# Patient Record
Sex: Male | Born: 1935 | Race: Black or African American | Hispanic: No | Smoking: Current every day smoker
Health system: Southern US, Community
[De-identification: ages and names within clinical notes are randomized; demographics above are authoritative.]

## PROBLEM LIST (undated history)

## (undated) DIAGNOSIS — F0280 Dementia in other diseases classified elsewhere without behavioral disturbance: Secondary | ICD-10-CM

## (undated) DIAGNOSIS — I10 Essential (primary) hypertension: Secondary | ICD-10-CM

## (undated) DIAGNOSIS — I509 Heart failure, unspecified: Secondary | ICD-10-CM

## (undated) DIAGNOSIS — N400 Enlarged prostate without lower urinary tract symptoms: Secondary | ICD-10-CM

## (undated) DIAGNOSIS — N189 Chronic kidney disease, unspecified: Secondary | ICD-10-CM

## (undated) DIAGNOSIS — C61 Malignant neoplasm of prostate: Secondary | ICD-10-CM

## (undated) DIAGNOSIS — D649 Anemia, unspecified: Secondary | ICD-10-CM

## (undated) DIAGNOSIS — E785 Hyperlipidemia, unspecified: Secondary | ICD-10-CM

## (undated) HISTORY — PX: PROSTATECTOMY: SHX69

## (undated) HISTORY — DX: Dementia in other diseases classified elsewhere, unspecified severity, without behavioral disturbance, psychotic disturbance, mood disturbance, and anxiety: F02.80

## (undated) HISTORY — DX: Anemia, unspecified: D64.9

## (undated) HISTORY — DX: Essential (primary) hypertension: I10

## (undated) HISTORY — DX: Malignant neoplasm of prostate: C61

## (undated) HISTORY — DX: Hyperlipidemia, unspecified: E78.5

## (undated) HISTORY — DX: Benign prostatic hyperplasia without lower urinary tract symptoms: N40.0

## (undated) HISTORY — DX: Heart failure, unspecified: I50.9

## (undated) HISTORY — DX: Chronic kidney disease, unspecified: N18.9

---

## 2000-04-01 HISTORY — PX: PROSTATE BIOPSY: SHX241

## 2000-04-04 ENCOUNTER — Other Ambulatory Visit: Admission: RE | Admit: 2000-04-04 | Discharge: 2000-04-04 | Payer: Self-pay | Admitting: Urology

## 2000-04-04 ENCOUNTER — Encounter (INDEPENDENT_AMBULATORY_CARE_PROVIDER_SITE_OTHER): Payer: Self-pay | Admitting: Specialist

## 2003-06-18 ENCOUNTER — Ambulatory Visit (HOSPITAL_COMMUNITY): Admission: RE | Admit: 2003-06-18 | Discharge: 2003-06-18 | Payer: Self-pay | Admitting: Urology

## 2003-06-21 ENCOUNTER — Ambulatory Visit (HOSPITAL_BASED_OUTPATIENT_CLINIC_OR_DEPARTMENT_OTHER): Admission: RE | Admit: 2003-06-21 | Discharge: 2003-06-21 | Payer: Self-pay | Admitting: Urology

## 2003-06-21 ENCOUNTER — Ambulatory Visit (HOSPITAL_COMMUNITY): Admission: RE | Admit: 2003-06-21 | Discharge: 2003-06-21 | Payer: Self-pay | Admitting: Urology

## 2003-06-21 HISTORY — PX: CYSTOSCOPY: SHX5120

## 2003-07-19 ENCOUNTER — Ambulatory Visit (HOSPITAL_COMMUNITY): Admission: RE | Admit: 2003-07-19 | Discharge: 2003-07-19 | Payer: Self-pay | Admitting: Urology

## 2003-08-08 ENCOUNTER — Inpatient Hospital Stay (HOSPITAL_COMMUNITY): Admission: RE | Admit: 2003-08-08 | Discharge: 2003-08-14 | Payer: Self-pay | Admitting: Urology

## 2003-08-08 ENCOUNTER — Encounter (INDEPENDENT_AMBULATORY_CARE_PROVIDER_SITE_OTHER): Payer: Self-pay | Admitting: *Deleted

## 2004-05-01 ENCOUNTER — Encounter (INDEPENDENT_AMBULATORY_CARE_PROVIDER_SITE_OTHER): Payer: Self-pay | Admitting: Specialist

## 2004-05-01 ENCOUNTER — Ambulatory Visit (HOSPITAL_COMMUNITY): Admission: RE | Admit: 2004-05-01 | Discharge: 2004-05-01 | Payer: Self-pay | Admitting: Urology

## 2004-05-01 ENCOUNTER — Ambulatory Visit (HOSPITAL_BASED_OUTPATIENT_CLINIC_OR_DEPARTMENT_OTHER): Admission: RE | Admit: 2004-05-01 | Discharge: 2004-05-01 | Payer: Self-pay | Admitting: Urology

## 2005-12-16 IMAGING — XA DG NEPHROSTOGRAM LEFT
1 series · 13 of 18 positions shown · IV contrast (omnipaque)
Comparison: none

CLINICAL DATA: 77 year-old male with bilateral hydronephrosis related to bladder outlet obstruction from prosthetic hypertrophy.  Request is made for internalization of the access with ureteral stents.
BILATERAL NEPHROSTOMY CATHETER EXCHANGE AND ANTEGRADE NEPHROSTOGRAMS
Radiologist:  Dr. Thang.
Guidance:  Fluoroscopy
Medications: 2 mg Versed 100 mcg Fentanyl.
Contrast: 60 cc Omnipaque 300 
Complications:  No immediate complications.
Procedure/Findings:
Informed consent was obtained from the patient.  
The patient was positioned prone. The left nephrostomy catheter was injected to confirm position.  The catheter was cut and removed over Strimaityte Uoga guidewire.  Access was manipulated through the dilated collecting system into the renal pelvis.  Additional injection was performed for antegrade left nephrostogram.  The catheter access was manipulated to the right ureterovesical junction.  Guidewire access was manipulated into the bladder.  The catheter was advanced into the bladder.  Contrast was injected to confirm bladder position. The Foley catheter was clamped to opacify the bladder.  The course of the Kumpe catheter through the ureterovesical junction is somewhat acute and the catheter appears to course over an intraluminal bladder filling defect or mass.  Because of this unusual configuration, attempt at inserting internal ureteral stents was not performed.  Therefore the access was exchanged over a guidewire for a new 10 Hober Gil pigtail catheter positioned in the renal pelvis. Contrast was injected to confirm position of the catheter.  
At this point, attention was directed to the right nephrostomy catheter, contrast was injected to confirm position.  The catheter tip is within an infundibulum and appears to be slightly retracted.  Therefore the catheter was cut and removed.  Over Strimaityte Uoga guidewire, a new 10 French catheter was advanced into the renal pelvis and confirmed with contrast injection fluoroscopically.  Additional contrast was injected for a right nephrostogram.  Both catheters were secured to the skin with 0 silk suture and connected to gravity drainage bags.  Dry sterile dressing was applied.

[Series 1000: run · 0.13mm/px · 13 of 18 slices shown]
[im 1/18]
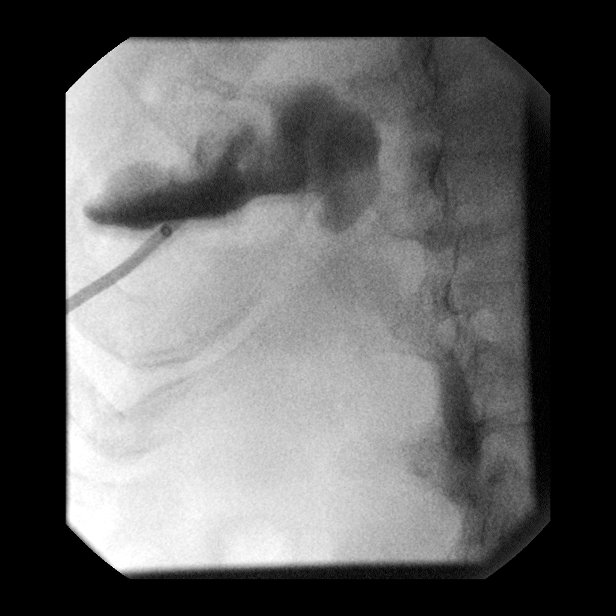
[im 3/18]
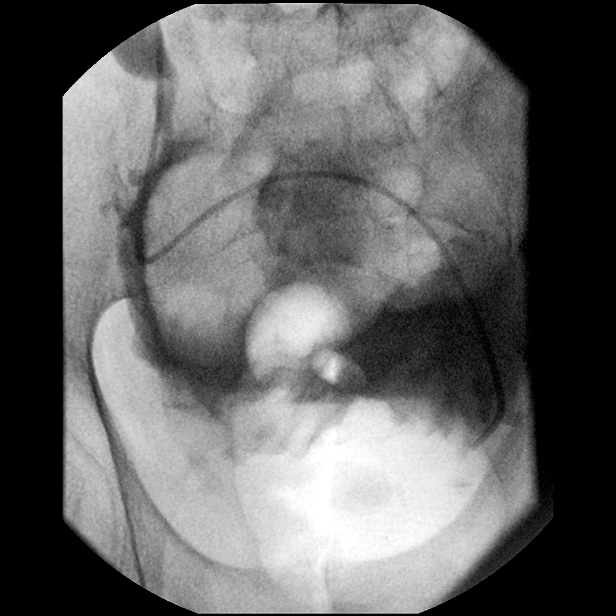
[im 4/18]
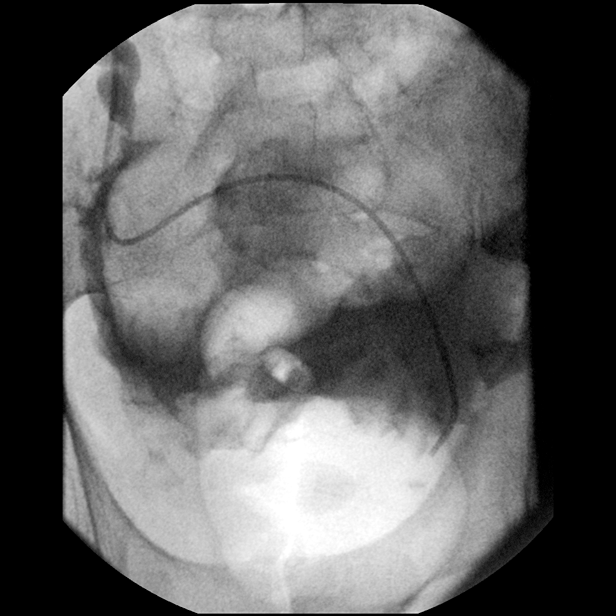
[im 5/18]
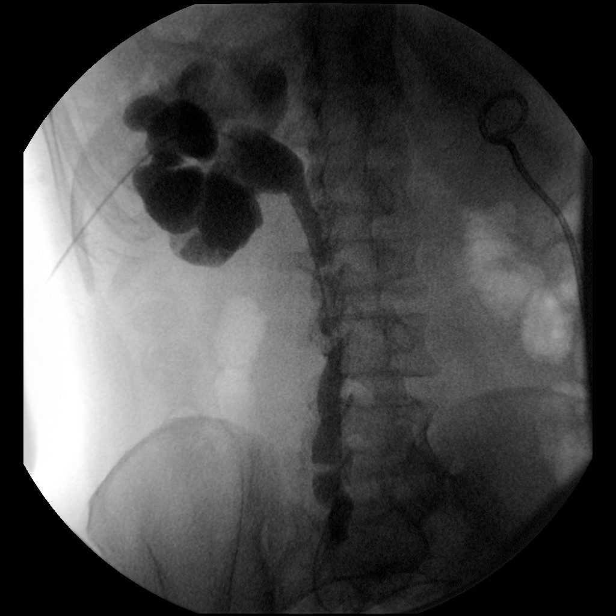
[im 7/18]
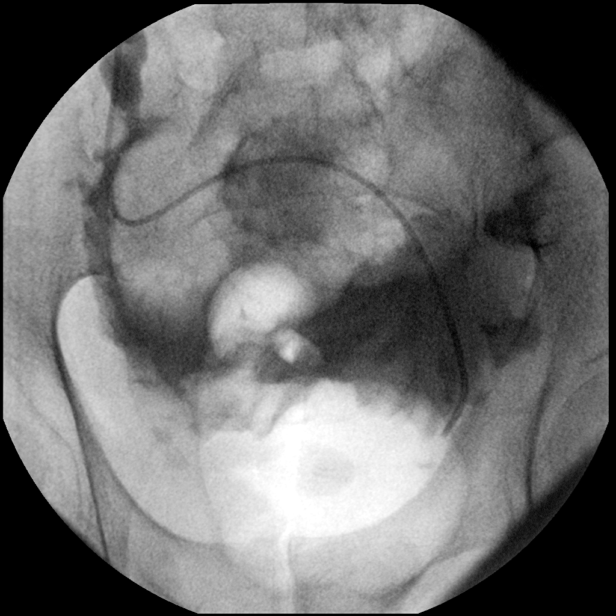
[im 8/18]
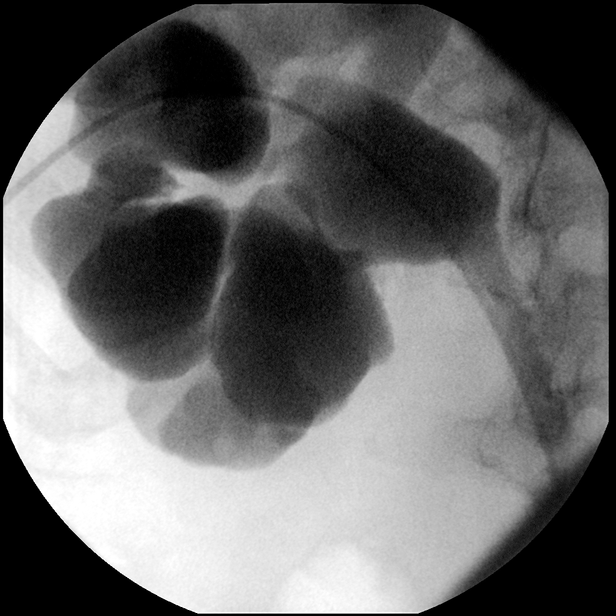
[im 10/18]
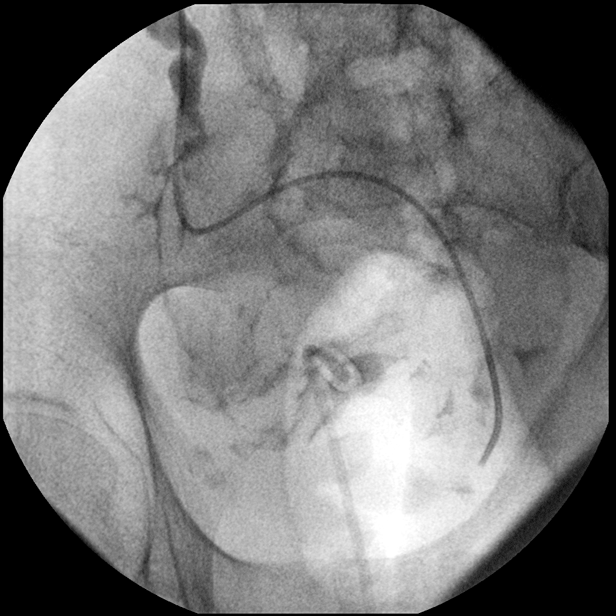
[im 11/18]
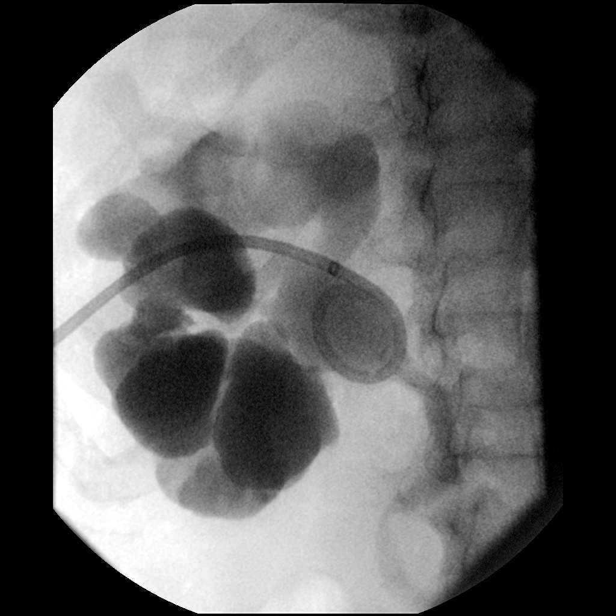
[im 12/18]
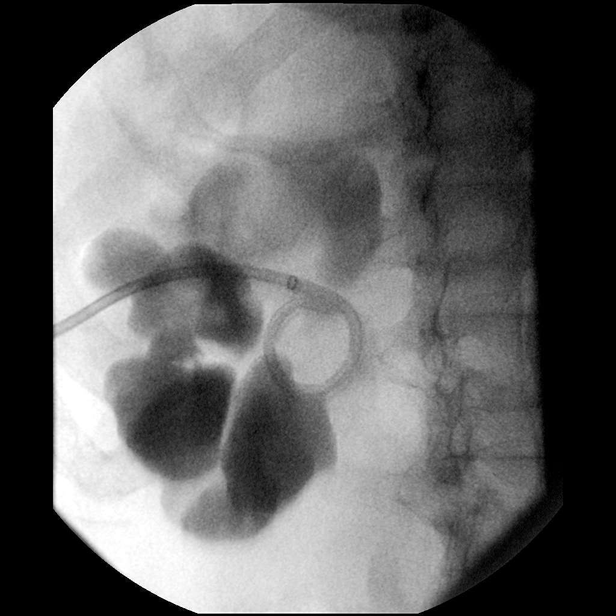
[im 14/18]
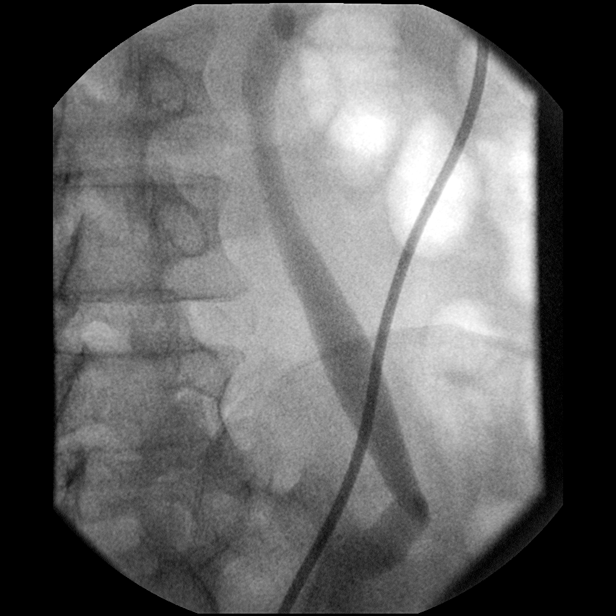
[im 15/18]
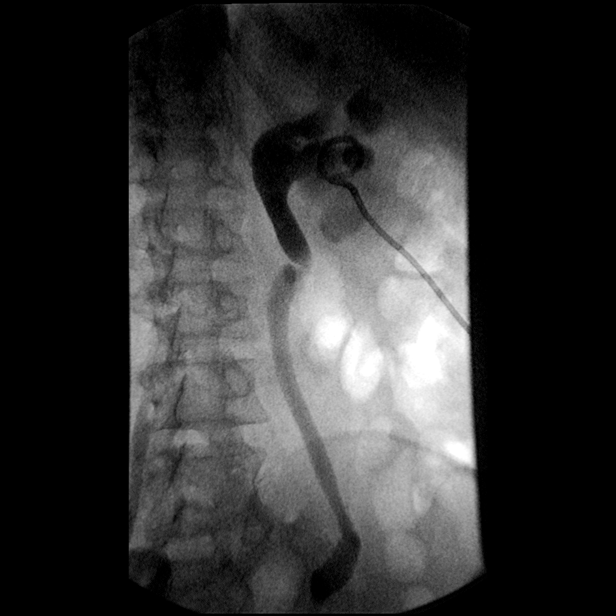
[im 16/18]
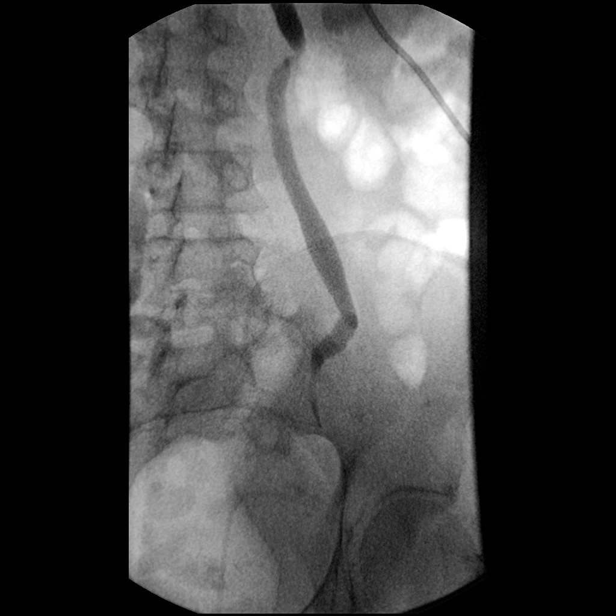
[im 18/18]
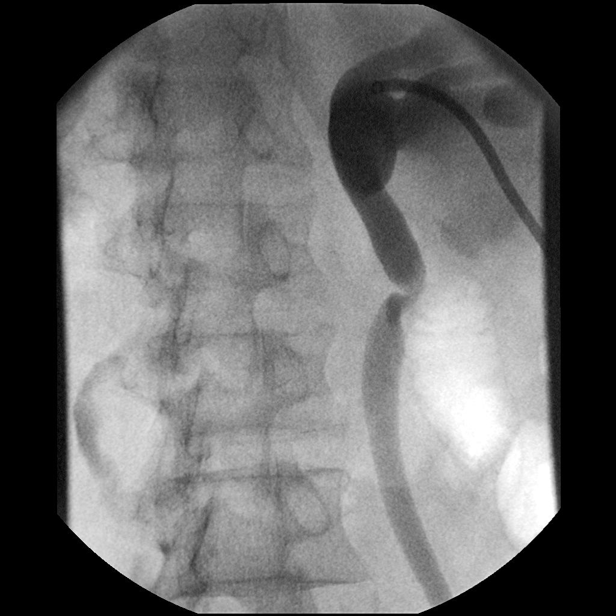

[13 of 18 positions shown; findings below may reference images not displayed]

FINDINGS: Left nephrostogram:  There is a massive right hydronephrosis with a dilated ureter as well.  The ureter is very tortuous.  Filling defects along the ureter are noted consistent with injected air.  The left UVJ is visualized.  The course of the catheter through the left UVJ is somewhat acute in angulation and appears to be displaced by an intraluminal bladder filling defect concerning for a bladder mass.  Foley catheter is evident in the midline.  The bladder is trabeculated related to outlet obstruction.  The enlarged prostate appears to impress upon the bladder base.
Right nephrostogram:  The right collecting system also is moderately dilated but to a lesser degree.  The right ureter is also mildly tortuous and dilated.  The distal ureterovesical junction could not be opacified and appears to be obstructed possibly related to compression related to the bladder mass.
IMPRESSION: 1.  Residual bilateral hydroureteronephrosis.
2.  Exchange of bilateral 10 French nephrostomy catheters.
3.  Suspect intraluminal large bladder mass displacing the catheter access through the left ureterovesical junction.  Because of this finding, insertion of double J ureteral stents was not performed.  Further evaluation and workup should be performed to investigate the possibility of a pelvic or bladder mass.

## 2006-03-28 ENCOUNTER — Ambulatory Visit: Admission: RE | Admit: 2006-03-28 | Discharge: 2006-06-17 | Payer: Self-pay | Admitting: Radiation Oncology

## 2006-06-17 ENCOUNTER — Ambulatory Visit: Admission: RE | Admit: 2006-06-17 | Discharge: 2006-07-19 | Payer: Self-pay | Admitting: Radiation Oncology

## 2006-11-26 ENCOUNTER — Inpatient Hospital Stay (HOSPITAL_COMMUNITY): Admission: EM | Admit: 2006-11-26 | Discharge: 2006-12-05 | Payer: Self-pay | Admitting: Emergency Medicine

## 2006-12-19 ENCOUNTER — Encounter (HOSPITAL_COMMUNITY): Admission: RE | Admit: 2006-12-19 | Discharge: 2007-03-19 | Payer: Self-pay | Admitting: Nephrology

## 2007-03-27 ENCOUNTER — Encounter (HOSPITAL_COMMUNITY): Admission: RE | Admit: 2007-03-27 | Discharge: 2007-06-25 | Payer: Self-pay | Admitting: Nephrology

## 2007-09-08 ENCOUNTER — Encounter (HOSPITAL_COMMUNITY): Admission: RE | Admit: 2007-09-08 | Discharge: 2007-12-07 | Payer: Self-pay | Admitting: Nephrology

## 2007-12-25 ENCOUNTER — Encounter (HOSPITAL_COMMUNITY): Admission: RE | Admit: 2007-12-25 | Discharge: 2008-01-11 | Payer: Self-pay | Admitting: Nephrology

## 2008-01-23 ENCOUNTER — Encounter (HOSPITAL_COMMUNITY): Admission: RE | Admit: 2008-01-23 | Discharge: 2008-04-22 | Payer: Self-pay | Admitting: Nephrology

## 2008-03-07 ENCOUNTER — Encounter: Admission: RE | Admit: 2008-03-07 | Discharge: 2008-03-07 | Payer: Self-pay | Admitting: Nephrology

## 2008-03-18 ENCOUNTER — Ambulatory Visit: Payer: Self-pay | Admitting: Surgery

## 2008-04-30 ENCOUNTER — Encounter (HOSPITAL_COMMUNITY): Admission: RE | Admit: 2008-04-30 | Discharge: 2008-07-29 | Payer: Self-pay | Admitting: Nephrology

## 2008-08-09 ENCOUNTER — Encounter (HOSPITAL_COMMUNITY): Admission: RE | Admit: 2008-08-09 | Discharge: 2008-11-07 | Payer: Self-pay | Admitting: Nephrology

## 2008-11-15 ENCOUNTER — Encounter (HOSPITAL_COMMUNITY): Admission: RE | Admit: 2008-11-15 | Discharge: 2009-02-13 | Payer: Self-pay | Admitting: Nephrology

## 2009-01-14 LAB — HM COLONOSCOPY

## 2009-02-21 ENCOUNTER — Encounter (HOSPITAL_COMMUNITY): Admission: RE | Admit: 2009-02-21 | Discharge: 2009-05-22 | Payer: Self-pay | Admitting: Nephrology

## 2009-06-06 ENCOUNTER — Encounter (HOSPITAL_COMMUNITY): Admission: RE | Admit: 2009-06-06 | Discharge: 2009-07-10 | Payer: Self-pay | Admitting: Nephrology

## 2009-08-01 ENCOUNTER — Encounter (HOSPITAL_COMMUNITY): Admission: RE | Admit: 2009-08-01 | Discharge: 2009-10-30 | Payer: Self-pay | Admitting: Nephrology

## 2009-08-07 ENCOUNTER — Encounter (INDEPENDENT_AMBULATORY_CARE_PROVIDER_SITE_OTHER): Payer: Self-pay | Admitting: Internal Medicine

## 2009-08-07 ENCOUNTER — Inpatient Hospital Stay (HOSPITAL_COMMUNITY): Admission: EM | Admit: 2009-08-07 | Discharge: 2009-08-11 | Payer: Self-pay | Admitting: Emergency Medicine

## 2009-08-11 ENCOUNTER — Ambulatory Visit: Payer: Self-pay | Admitting: Vascular Surgery

## 2009-08-11 HISTORY — PX: AV FISTULA PLACEMENT: SHX1204

## 2009-09-09 ENCOUNTER — Ambulatory Visit: Payer: Self-pay | Admitting: Vascular Surgery

## 2009-11-14 ENCOUNTER — Encounter (HOSPITAL_COMMUNITY)
Admission: RE | Admit: 2009-11-14 | Discharge: 2010-02-10 | Payer: Self-pay | Source: Home / Self Care | Attending: Nephrology | Admitting: Nephrology

## 2010-01-26 LAB — POCT HEMOGLOBIN-HEMACUE: Hemoglobin: 11.1 g/dL — ABNORMAL LOW (ref 13.0–17.0)

## 2010-02-01 ENCOUNTER — Encounter: Payer: Self-pay | Admitting: Urology

## 2010-02-20 ENCOUNTER — Encounter (HOSPITAL_COMMUNITY): Payer: Medicare Other | Attending: Nephrology

## 2010-02-20 ENCOUNTER — Other Ambulatory Visit: Payer: Self-pay

## 2010-02-20 DIAGNOSIS — D638 Anemia in other chronic diseases classified elsewhere: Secondary | ICD-10-CM | POA: Insufficient documentation

## 2010-02-20 DIAGNOSIS — N184 Chronic kidney disease, stage 4 (severe): Secondary | ICD-10-CM | POA: Insufficient documentation

## 2010-03-20 ENCOUNTER — Encounter (HOSPITAL_COMMUNITY)
Admission: RE | Admit: 2010-03-20 | Discharge: 2010-03-20 | Disposition: A | Discharge status: Home / Self Care | Payer: Medicare Other | Source: Ambulatory Visit | Attending: Nephrology | Admitting: Nephrology

## 2010-03-20 ENCOUNTER — Other Ambulatory Visit: Payer: Self-pay | Admitting: Nephrology

## 2010-03-20 ENCOUNTER — Other Ambulatory Visit: Payer: Self-pay

## 2010-03-20 DIAGNOSIS — N184 Chronic kidney disease, stage 4 (severe): Secondary | ICD-10-CM | POA: Insufficient documentation

## 2010-03-20 DIAGNOSIS — D638 Anemia in other chronic diseases classified elsewhere: Secondary | ICD-10-CM | POA: Insufficient documentation

## 2010-03-20 LAB — IRON AND TIBC
Saturation Ratios: 17 % — ABNORMAL LOW (ref 20–55)
TIBC: 259 ug/dL (ref 215–435)
UIBC: 215 ug/dL

## 2010-03-20 LAB — FERRITIN: Ferritin: 145 ng/mL (ref 22–322)

## 2010-03-23 LAB — POCT HEMOGLOBIN-HEMACUE
Hemoglobin: 11.1 g/dL — ABNORMAL LOW (ref 13.0–17.0)
Hemoglobin: 12.9 g/dL — ABNORMAL LOW (ref 13.0–17.0)

## 2010-03-24 LAB — FERRITIN: Ferritin: 165 ng/mL (ref 22–322)

## 2010-03-26 LAB — POCT HEMOGLOBIN-HEMACUE: Hemoglobin: 12.1 g/dL — ABNORMAL LOW (ref 13.0–17.0)

## 2010-03-27 LAB — BASIC METABOLIC PANEL
CO2: 22 mEq/L (ref 19–32)
Calcium: 8 mg/dL — ABNORMAL LOW (ref 8.4–10.5)
Creatinine, Ser: 6.28 mg/dL — ABNORMAL HIGH (ref 0.4–1.5)
GFR calc Af Amer: 10 mL/min — ABNORMAL LOW (ref >60)

## 2010-03-27 LAB — FERRITIN: Ferritin: 159 ng/mL (ref 22–322)

## 2010-03-27 LAB — CBC
MCH: 30.5 pg (ref 26.0–34.0)
MCHC: 33.3 g/dL (ref 30.0–36.0)
Platelets: 253 10*3/uL (ref 150–400)
RBC: 4.31 MIL/uL (ref 4.22–5.81)

## 2010-03-27 LAB — IRON AND TIBC
Saturation Ratios: 21 % (ref 20–55)
TIBC: 241 ug/dL (ref 215–435)

## 2010-03-28 LAB — DIFFERENTIAL
Basophils Absolute: 0 10*3/uL (ref 0.0–0.1)
Basophils Relative: 0 % (ref 0–1)
Neutro Abs: 9.4 10*3/uL — ABNORMAL HIGH (ref 1.7–7.7)
Neutrophils Relative %: 93 % — ABNORMAL HIGH (ref 43–77)

## 2010-03-28 LAB — CBC
HCT: 35.9 % — ABNORMAL LOW (ref 39.0–52.0)
Hemoglobin: 11.9 g/dL — ABNORMAL LOW (ref 13.0–17.0)
Hemoglobin: 12.9 g/dL — ABNORMAL LOW (ref 13.0–17.0)
MCH: 30.5 pg (ref 26.0–34.0)
MCHC: 32.4 g/dL (ref 30.0–36.0)
MCHC: 33.2 g/dL (ref 30.0–36.0)
Platelets: 251 10*3/uL (ref 150–400)
Platelets: 253 10*3/uL (ref 150–400)
RBC: 4.25 MIL/uL (ref 4.22–5.81)
RDW: 15.8 % — ABNORMAL HIGH (ref 11.5–15.5)
RDW: 16.1 % — ABNORMAL HIGH (ref 11.5–15.5)
WBC: 5.8 10*3/uL (ref 4.0–10.5)
WBC: 6.7 10*3/uL (ref 4.0–10.5)

## 2010-03-28 LAB — COMPREHENSIVE METABOLIC PANEL
ALT: 11 U/L (ref 0–53)
AST: 14 U/L (ref 0–37)
Alkaline Phosphatase: 83 U/L (ref 39–117)
CO2: 20 mEq/L (ref 19–32)
Calcium: 8 mg/dL — ABNORMAL LOW (ref 8.4–10.5)
GFR calc Af Amer: 11 mL/min — ABNORMAL LOW (ref >60)
Potassium: 4 mEq/L (ref 3.5–5.1)
Sodium: 142 mEq/L (ref 135–145)
Total Protein: 6.2 g/dL (ref 6.0–8.3)

## 2010-03-28 LAB — BASIC METABOLIC PANEL
BUN: 43 mg/dL — ABNORMAL HIGH (ref 6–23)
Calcium: 8.1 mg/dL — ABNORMAL LOW (ref 8.4–10.5)
Calcium: 8.2 mg/dL — ABNORMAL LOW (ref 8.4–10.5)
Creatinine, Ser: 6.42 mg/dL — ABNORMAL HIGH (ref 0.4–1.5)
GFR calc Af Amer: 10 mL/min — ABNORMAL LOW (ref >60)
GFR calc non Af Amer: 8 mL/min — ABNORMAL LOW (ref >60)
GFR calc non Af Amer: 9 mL/min — ABNORMAL LOW (ref >60)
Glucose, Bld: 115 mg/dL — ABNORMAL HIGH (ref 70–99)
Sodium: 139 mEq/L (ref 135–145)
Sodium: 143 mEq/L (ref 135–145)

## 2010-03-28 LAB — POCT I-STAT 3, ART BLOOD GAS (G3+)
Acid-base deficit: 8 mmol/L — ABNORMAL HIGH (ref 0.0–2.0)
O2 Saturation: 98 %

## 2010-03-28 LAB — CARDIAC PANEL(CRET KIN+CKTOT+MB+TROPI)
Relative Index: 4.1 — ABNORMAL HIGH (ref 0.0–2.5)
Relative Index: 4.3 — ABNORMAL HIGH (ref 0.0–2.5)
Relative Index: INVALID (ref 0.0–2.5)
Total CK: 115 U/L (ref 7–232)
Total CK: 117 U/L (ref 7–232)
Total CK: 89 U/L (ref 7–232)
Troponin I: 0.07 ng/mL — ABNORMAL HIGH (ref 0.00–0.06)

## 2010-03-28 LAB — APTT: aPTT: 38 seconds — ABNORMAL HIGH (ref 24–37)

## 2010-03-28 LAB — LIPID PANEL
LDL Cholesterol: 91 mg/dL (ref 0–99)
Total CHOL/HDL Ratio: 4.4 RATIO
VLDL: 17 mg/dL (ref 0–40)

## 2010-03-28 LAB — BRAIN NATRIURETIC PEPTIDE: Pro B Natriuretic peptide (BNP): 1000 pg/mL — ABNORMAL HIGH (ref 0.0–100.0)

## 2010-03-28 LAB — POCT CARDIAC MARKERS
CKMB, poc: 2 ng/mL (ref 1.0–8.0)
Myoglobin, poc: 353 ng/mL (ref 12–200)

## 2010-03-28 LAB — PROTIME-INR: INR: 1.17 (ref 0.00–1.49)

## 2010-03-28 LAB — POCT HEMOGLOBIN-HEMACUE: Hemoglobin: 11.5 g/dL — ABNORMAL LOW (ref 13.0–17.0)

## 2010-03-29 LAB — POCT HEMOGLOBIN-HEMACUE
Hemoglobin: 13.4 g/dL (ref 13.0–17.0)
Hemoglobin: 12.1 g/dL — ABNORMAL LOW (ref 13.0–17.0)
Hemoglobin: 12.3 g/dL — ABNORMAL LOW (ref 13.0–17.0)

## 2010-03-30 LAB — FERRITIN: Ferritin: 174 ng/mL (ref 22–322)

## 2010-03-30 LAB — IRON AND TIBC: Iron: 50 ug/dL (ref 42–135)

## 2010-04-01 LAB — POCT HEMOGLOBIN-HEMACUE
Hemoglobin: 12.1 g/dL — ABNORMAL LOW (ref 13.0–17.0)
Hemoglobin: 11.4 g/dL — ABNORMAL LOW (ref 13.0–17.0)

## 2010-04-01 LAB — IRON AND TIBC
Iron: 69 ug/dL (ref 42–135)
TIBC: 251 ug/dL (ref 215–435)
TIBC: 252 ug/dL (ref 215–435)
UIBC: 182 ug/dL

## 2010-04-01 LAB — FERRITIN: Ferritin: 227 ng/mL (ref 22–322)

## 2010-04-03 ENCOUNTER — Ambulatory Visit (HOSPITAL_COMMUNITY): Payer: Medicare Other | Attending: Nephrology

## 2010-04-03 DIAGNOSIS — D509 Iron deficiency anemia, unspecified: Secondary | ICD-10-CM | POA: Insufficient documentation

## 2010-04-07 ENCOUNTER — Encounter (HOSPITAL_COMMUNITY): Payer: BC Managed Care – PPO

## 2010-04-13 LAB — POCT HEMOGLOBIN-HEMACUE: Hemoglobin: 12.7 g/dL — ABNORMAL LOW (ref 13.0–17.0)

## 2010-04-14 LAB — POCT HEMOGLOBIN-HEMACUE: Hemoglobin: 11.6 g/dL — ABNORMAL LOW (ref 13.0–17.0)

## 2010-04-16 LAB — POCT HEMOGLOBIN-HEMACUE
Hemoglobin: 12.1 g/dL — ABNORMAL LOW (ref 13.0–17.0)
Hemoglobin: 12.1 g/dL — ABNORMAL LOW (ref 13.0–17.0)

## 2010-04-16 LAB — IRON AND TIBC: Saturation Ratios: 23 % (ref 20–55)

## 2010-04-17 ENCOUNTER — Other Ambulatory Visit: Payer: Self-pay | Admitting: Nephrology

## 2010-04-17 ENCOUNTER — Encounter (HOSPITAL_COMMUNITY): Payer: Medicare Other | Attending: Nephrology

## 2010-04-17 DIAGNOSIS — D638 Anemia in other chronic diseases classified elsewhere: Secondary | ICD-10-CM | POA: Insufficient documentation

## 2010-04-17 DIAGNOSIS — N184 Chronic kidney disease, stage 4 (severe): Secondary | ICD-10-CM | POA: Insufficient documentation

## 2010-04-18 LAB — POCT HEMOGLOBIN-HEMACUE: Hemoglobin: 11.9 g/dL — ABNORMAL LOW (ref 13.0–17.0)

## 2010-04-20 LAB — FERRITIN: Ferritin: 72 ng/mL (ref 22–322)

## 2010-04-20 LAB — IRON AND TIBC
Iron: 87 ug/dL (ref 42–135)
UIBC: 184 ug/dL

## 2010-04-22 LAB — POCT HEMOGLOBIN-HEMACUE: Hemoglobin: 12.1 g/dL — ABNORMAL LOW (ref 13.0–17.0)

## 2010-04-27 LAB — POCT HEMOGLOBIN-HEMACUE
Hemoglobin: 12.1 g/dL — ABNORMAL LOW (ref 13.0–17.0)
Hemoglobin: 11.6 g/dL — ABNORMAL LOW (ref 13.0–17.0)

## 2010-05-15 ENCOUNTER — Encounter (HOSPITAL_COMMUNITY): Payer: Medicare Other | Attending: Nephrology

## 2010-05-15 ENCOUNTER — Other Ambulatory Visit: Payer: Self-pay | Admitting: Nephrology

## 2010-05-15 DIAGNOSIS — D638 Anemia in other chronic diseases classified elsewhere: Secondary | ICD-10-CM | POA: Insufficient documentation

## 2010-05-15 DIAGNOSIS — N184 Chronic kidney disease, stage 4 (severe): Secondary | ICD-10-CM | POA: Insufficient documentation

## 2010-05-18 LAB — POCT HEMOGLOBIN-HEMACUE: Hemoglobin: 11.5 g/dL — ABNORMAL LOW (ref 13.0–17.0)

## 2010-05-26 NOTE — Procedures (Signed)
CEPHALIC VEIN MAPPING   INDICATION:  Vein mapping for dialysis access.   HISTORY:  End-stage renal disease.   EXAM:   The right cephalic vein is compressible.   Diameter measurements range from 0.28 to 0.10 cm.   The left cephalic vein is compressible.   Diameter measurements range from 0.29 cm to 0.16 cm.   See attached worksheet for all measurements.   IMPRESSION:  Patent bilateral cephalic veins which are of acceptable  diameter for use as a dialysis access site.   ___________________________________________  V. Charlena Cross, MD   AC/MEDQ  D:  03/18/2008  T:  03/18/2008  Job:  [id number]

## 2010-05-26 NOTE — Consult Note (Signed)
NAMEWIL, [name]                ACCOUNT NO.:  [id number]   MEDICAL RECORD NO.:  [id number]          PATIENT TYPE:  INP   LOCATION:  6731                         FACILITY:  [facility]   PHYSICIAN:  Lindaann Slough, M.D.  DATE OF BIRTH:  05/04/1925   DATE OF CONSULTATION:  11/28/2006  DATE OF DISCHARGE:                                 CONSULTATION   REASON FOR CONSULTATION:  Gross hematuria.  Prostate cancer   HISTORY:  The patient is a 77 year old male who has prostate cancer.  He  was diagnosed in November 2004.  However at that time, his wife was sick  and he did want to have any treatment.  He has a history of renal  insufficiency and underwent suprapubic prostatectomy in July 2005  followed by TUR of residual tissue in April 2006.  He was then treated  with IMRT from May 11, 2006 through July 06, 2006.  His PSA was 9.37  in February 2008 and went down to 2.86 in October 2008.  His creatinine  was 4.3 in February 2008 was down to 3.8 in March 2008 and 3.5 in  October 2008.  He has been voiding fairly well since he had his TUR in  April 2006 and had not had any problems.  However over the last week, he  started complaining of nausea and vomiting.  He had poor appetite.  Over  the last for 4-5 days, he was unable to eat or drink.  He has been  voiding fairly well.  The patient was then taken to the emergency room  on November 26, 2006  because of nausea and vomiting.  His hemoglobin on  November 26, 2006 was 4.6, hematocrit 14.2, WBC 22,000, PSA was 2.39,  BUN on November 27, 2006 was 3.93, BUN was 42, creatinine 3.93.   PAST MEDICAL HISTORY:  1. Positive for hypertension.  2. Prostate cancer.  3. He has a history of renal insufficiency.   PAST SURGICAL HISTORY:  1. Suprapubic prostatectomy in July 2005.  2. TURP in April 2006.   MEDICATIONS:  1. Toprol XL 100 mg a day.  2. Triamterine/HCTZ 75/50 daily.  3. Norvasc 10 mg a day.   ALLERGIES:  NO KNOWN DRUG ALLERGIES.   FAMILY HISTORY:  Positive for coronary artery disease and his father  died of CVA at age 11.   SOCIAL HISTORY:  The patient is a widower.  He has 3 children.  Does not  drink, but smokes half a pack a day.   REVIEW OF SYSTEMS:  As noted in the HPI and he has gross hematuria.   PHYSICAL EXAMINATION:  GENERAL:  He is alert and oriented at this time.  He states that he is eating better now.  VITAL SIGNS:  Blood pressure is 114/61, pulse 72, respirations 18,  temperature 98.9.  ABDOMEN:  Soft, nondistended, nontender.  He has no CVA tenderness.  Liver, spleen and kidneys are not palpable.  Bladder is not distended.  GU:  Penis is uncircumcised.  Meatus is normal.  Scrotum is normal.  He  has no hydrocele, no testicular  mass.  He has no inguinal hernia.  He  has a Foley catheter that is draining bloody urine.   LABORATORY DATA:  The patient was transfused 4 units of packed cells and  his hemoglobin today is 8.4, hematocrit 25 and WBC 12.3.   DIAGNOSTICS:  Renal ultrasound showed bilateral renal cysts and  increased echogenicity of both kidneys suggesting medical renal disease.   IMPRESSION:  1. Gross hematuria.  2. History of prostate cancer.  3. Renal insufficiency.  4. Anemia.  5. Hypertension.   SUGGESTIONS:  1. Leave Foley catheter indwelling.  Irrigate catheter p.r.n.  2. We will follow the patient with you.      Lindaann Slough, M.D.  Electronically Signed     MN/MEDQ  D:  11/28/2006  T:  11/29/2006  Job:  [id number]

## 2010-05-26 NOTE — Discharge Summary (Signed)
NAMESHRAVAN, [name]                ACCOUNT NO.:  [id number]   MEDICAL RECORD NO.:  [id number]          PATIENT TYPE:  INP   LOCATION:  6731                         FACILITY:  [facility]   PHYSICIAN:  Beckey Rutter, MD  DATE OF BIRTH:  1925/03/29   DATE OF ADMISSION:  11/26/2006  DATE OF DISCHARGE:  12/05/2006                               DISCHARGE SUMMARY   PRIMARY CARE PHYSICIAN:  Dr. Ronne Binning.   CHIEF COMPLAINT ON PRESENTATION:  Intractable nausea and vomiting.   HISTORY OF PRESENT ILLNESS:  [name] is a pleasant 77 year old  gentleman found to have severe anemia and worsening renal function on  presentation.  The patient also was found to have hematuria.   HOSPITAL COURSE:  During hospitalization, the patient received a blood  transfusion for his acute blood loss anemia several times.  The patient  received Amicar for his hematuria with improvement.  Now, the patient's  urine is clear and Amicar was discontinued.  Worsening renal function.  Slight improvement was achieved, and the patient is now to his baseline  of 3.5.   CONSULTATIONS:  1. The patient was seen and evaluated by Dr. Brunilda Payor with neurology.  2. Dr. Briant Cedar with nephrology.   DISCHARGE DIAGNOSES:  1. Acute blood loss anemia secondary to hematuria.  2. Hematuria.  3. Acute-on-chronic renal failure.  4. Hypertension.   SECONDARY DIAGNOSES:  1. Prostate adenocarcinoma.  2. Chronic renal failure.  3. Tobacco abuse.   DISCHARGE MEDICATIONS:  1. Norvasc 10 mg p.o. daily.  2. Calcitriol 0.25 mg p.o. daily.  3. Bicitra 30 cc p.o. twice a day.  4. Senokot 1 tablet b.i.d. p.r.n..   DISCHARGE PLAN:  The patient now has a stable hemoglobin with his renal  function to the baseline which is about chronic kidney disease stage IV.  The plan is to continue on the medication as discussed above and to  follow up with the nephrologist, Dr. Briant Cedar, and to follow up with  Dr. Brunilda Payor as discussed with the patient.   The  patient is interested in a health care or assisted-living facility  to help him at home because he was feeling and unable to accommodate all  his ADLs.  I will have the social worker discuss his options with him,  and the patient also recommended to follow through with Dr. Ronne Binning for  further placement needs.      Beckey Rutter, MD  Electronically Signed     EME/MEDQ  D:  12/05/2006  T:  12/05/2006  Job:  [id number]   cc:   Dr. Sherryll Burger, M.D.  Lindaann Slough, M.D.

## 2010-05-26 NOTE — Assessment & Plan Note (Signed)
OFFICE VISIT   [name], [name]  DOB:  1925/09/10                                       09/09/2009  EXBMW#:[id number]   The patient presents today for evaluation of placement of left upper arm  AV fistula by myself on 08/11/2009.  He was inpatient at the hospital at  that time.  He has had excellent early maturation with a very large,  very nicely dilated cephalic vein fistula.  He will continue his  exercise program and hopefully will not come to hemodialysis but if he  does I feel confident that this will work quite nicely for him.  He will  follow up on an as needed basis.     Larina Earthly, M.D.  Electronically Signed   TFE/MEDQ  D:  09/09/2009  T:  09/10/2009  Job:  [id number]   cc:   Dyke Maes, M.D.

## 2010-05-26 NOTE — Assessment & Plan Note (Signed)
OFFICE VISIT   [name], [name]  DOB:  July 06, 1925                                       03/18/2008  ZOXWR#:[id number]   REASON FOR VISIT:  Evaluate for dialysis access.   HISTORY:  This is an 77 year old gentleman with end-stage renal disease  secondary to hypertension.  His renal function has declined to  approximately 11%.  He comes in today for dialysis access planning.  He  is right-handed.   PAST MEDICAL HISTORY:  1. Hypertension.  2. Chronic kidney disease.  3. History of prostate cancer status post prostatectomy and radiation.  4. Secondary hyperparathyroidism.  5. Anemia.   SOCIAL HISTORY:  He is widowed with 3 children.  He is retired.  Currently smokes approximately 1/2-pack a day.  Does not drink alcohol.   REVIEW OF SYSTEMS:  Positive for weight loss.  CARDIAC:  Negative.  PULMONARY:  Negative.  GI:  Positive for trouble swallowing.  GU:  Positive for kidney disease.  VASCULAR:  Pain in legs with walking.  NEURO:  Negative.  ORTHO:  Positive for arthritis.  PSYCH:  Negative.  ENT:  Positive for a recent change in hearing.  HEME:  Positive for anemia.   FAMILY HISTORY:  Noncontributory.  His wife was on dialysis, she has  subsequently passed away.   MEDICATIONS:  Amlodipine, calcitriol, Lasix, labetalol, sodium  bicarbonate, vitamin and Iron.   ALLERGIES:  None.   PHYSICAL EXAMINATION:  Blood pressure is 183/83, pulse 65.  General:  He  is well-appearing, no distress.  Cardiovascular:  Regular rate and  rhythm.  Pulmonary:  Respirations are nonlabored.  Extremities:  Warm  and well-perfused.  He has a palpable left radial and brachial pulse.   DIAGNOSTIC STUDIES:  Vein mapping was performed today.  He did not have  adequate caliber cephalic vein.   ASSESSMENT/PLAN:  Chronic kidney disease nearing dialysis.   Plan:  This is a right-handed gentleman who does not have adequate vein  for a fistula; therefore, we will plan on  placing a left forearm AV Gore-  Tex graft.  This has been scheduled for Thursday, March 18th, pending  the patient's discussion with Dr. Briant Cedar this week.  I discussed the  risks and benefits of placing a graft including infection and steal  syndrome.  He understands these risks.  His daughter was also in the  room today for this discussion.  Again, will plan on a left forearm AV  Gore-Tex graft Thursday, March 18th.   Jorge Ny, MD  Electronically Signed   VWB/MEDQ  D:  03/18/2008  T:  03/19/2008  Job:  [id number]   cc:   Dyke Maes, M.D.

## 2010-05-26 NOTE — Consult Note (Signed)
[name], [name]                ACCOUNT NO.:  [id number]   MEDICAL RECORD NO.:  [id number]          PATIENT TYPE:  INP   LOCATION:  6731                         FACILITY:  [facility]   PHYSICIAN:  Dyke Maes, M.D.DATE OF BIRTH:  [birth year]   DATE OF CONSULTATION:  11/30/2006  DATE OF DISCHARGE:                                 CONSULTATION   REFERRING PHYSICIAN:  Rose Phi. Myna Rich, M.D.   HPI:  This 77 year old black male admitted on November 26, 2006 for  nausea and vomiting.  He also has had recurrent gross hematuria since  treatment for prostate cancer which he finished up in July 2008.  His  hemoglobin was 4.6 on admission.  With transfusion his symptoms have  improved.  He does have a history of chronic Rich disease but has not  pursued evaluation as he was taking care of his wife who was an end-  stage renal disease patient of our practice [name]), who has  now passed away.  In review of serum creatinines, his serum creatinine  in August 2004 was 2.4, in April 2006 3.6, on admission November 15 4.5,  and currently 3.3.  A urinalysis in April 2006 showed some pyuria and  hematuria with a negative urine culture.  Renal ultrasound this  admission shows no hydronephrosis, marked increase echogenicity of both  kidneys, with the right being 10.3 cm, left 9.4 cm.  He has had  hypertension for at least 30 years.  His urine output during this  hospitalization has been quite good at 1.2-4.7 liters.  He denies any  systemic symptoms.   PAST MEDICAL HISTORY:  1. Significant for hypertension.  2. Chronic Rich disease.  3. Prostate cancer, status post suprapubic prostatectomy in 2005,      status post radiation therapy which finished in July 2008.   ALLERGIES:  None.   MEDICATIONS:  Currently include Protonix 40 mg a day.   SOCIAL HISTORY:  Positive smoker and continues to smoke half pack per  day for over 12 years, denies alcohol use.  He is a widower, he has 3  children.   FAMILY HISTORY:  Father died of CVA.  Mother died of an MI.  No family  history of renal failure.   REVIEW OF SYSTEMS:  Appetite has improved since admission.  He denies  any shortness of breath, PND, orthopnea, no chest pains or chest  pressures.  No recent change in bowel habits.  No new arthritic  complaints.  No new neuropathic symptoms.  No skin lesions.  The rest of  review of systems unremarkable.   PHYSICAL EXAM:  Blood pressure 122/60, pulse 74, temperature 98.9.  GENERAL:  A 77 year old black male in no acute distress.  HEENT:  Sclera nonicteric.  Extraocular movements are intact.  NECK:  Reveals no JVD.  No bruits.  LUNGS:  Clear to auscultation.  HEART:  Regular rate and rhythm without murmur, rub or gallop.  ABDOMEN:  Positive bowel sounds, nontender, nondistended.  No  hepatosplenomegaly.  No bruits.  EXTREMITIES:  No clubbing, cyanosis or  edema.  Pulses 2/4 in  the carotids, radials, femorals, 0/4 in the  dorsalis pedis and posterior tibial.  NEUROLOGIC EXAM:  Cranial nerves intact.  Motor and sensory intact.  No  asterixis.  GU:  Foley catheter in place with gross blood in the Foley bag.   LABORATORY:  Sodium 136, potassium 3.7, bicarb 22, BUN 31, creatinine  3.3, hemoglobin 7.8, white count 9.9, platelet count 203,000,  phosphorous 3.3.   IMPRESSION:  1. Chronic Rich disease stage 4 with uncertain etiology, though this      has certainly been longstanding and possibly related to      nephrosclerosis from hypertension and age.  2. Gross hematuria, presumably secondary to radiation cystitis.  3. History of prostate cancer.  4. Hypertension, currently well controlled.   RECOMMENDATIONS:  1. His renal function is essentially unchanged at baseline over the      last 2 years which is fairly remarkable.  2. He may well need Epogen but it is hard to know with acute bleeding      going on.  If the bleeding stops and hemoglobin remains on the low       side, we can certainly start Epogen therapy.  3. I would transfuse as needed.  4. We will check a PTH level.  5. Check iron studies.  6. I discussed with him the reality of dialysis sometime in the      future.  He says this is something he will need to think about.  7. He was told to avoid nonsteroidal medications.  8. I would avoid ACE inhibitors and ARBs at this point.   Thank you very much for this consult.  We will follow this patient with  you.           ______________________________  Dyke Maes, M.D.     MTM/MEDQ  D:  11/30/2006  T:  12/01/2006  Job:  [id number]

## 2010-05-26 NOTE — H&P (Signed)
[name], Rich                ACCOUNT NO.:  [id number]   MEDICAL RECORD NO.:  [id number]          PATIENT TYPE:  INP   LOCATION:  1825                         FACILITY:  [facility]   PHYSICIAN:  Lonia Blood, M.D.DATE OF BIRTH:  1925-04-28   DATE OF ADMISSION:  11/26/2006  DATE OF DISCHARGE:                              HISTORY & PHYSICAL   PRIMARY CARE PHYSICIAN:  Lorelle Formosa, M.D.   CHIEF COMPLAINT:  Intractable nausea, vomiting.   HISTORY OF PRESENT ILLNESS:  [name] is a very pleasant 77-year-  old gentleman dealing with a longstanding history of prostate cancer.  He originally underwent a suprapubic prostatectomy August 2005.  Apparently, he has had some difficulty with recurrence within some  residual tissue and required radiation therapy, which ended in July of  this year.  He has been in his usual stated of health since that  time  and has had no significant major medical problems.  He has had no other  major surgeries or recent medical illnesses.  He is not aware of any  history of renal failure, as best he can recall.  The patient states  that he had gradually worsening nausea and vomiting for approximately  one week now.  Initially, it was somewhat nagging but did not limit his  intake.  As it progressed over the next 2 to 3 days, however, it became  so severe that the patient was no longer able to eat or drink.  Even the  idea of drinking has made his nausea worse.  Over the last 3 to 4 days,  he is essentially not eating or drinking anything of any substance.  He  denies any hemoptysis.  He denies hematemesis.  He does report that he  has had intermittent hematuria.  He denies difficulty urinating.  He  denies abdominal distention, bladder distention or abdominal pain, and  the patient's symptoms continued and he was no longer able to take p.o.  His family encouraged him to present to the emergency room for  evaluation.   REVIEW OF SYSTEMS:  A  comprehensive review of systems is entirely  unremarkable, with the exception of positive elements noted in the  history of present illness above.   PAST MEDICAL HISTORY:  1. Hypertension.  2. Prostate adenocarcinoma diagnosed 2004.      a.     Status post suprapubic prostatectomy, August 2005.      b.     Status post radiation therapy, completed in July of 2008 for       recurrence and apparent residual tissue.      c.     Reportedly well-controlled per recent visit with Dr. Brunilda Payor,       per patient.  3. Ongoing tobacco abuse in the amount of one-half pack per day now      with previous 12-pack year history minimum.  4. History of creatinine 3.6, April 2006, said the patient denies      having a known history of chronic renal failure.   OUTPATIENT MEDICATIONS:  1. Toprol XL 100 mg p.o. q.  day.  2. Triamterine/HCTZ 75/50 p.o. q. day.  3. Norvasc 10 mg q. day.   ALLERGIES:  NO KNOWN DRUG ALLERGIES.   FAMILY HISTORY:  The patient's father has died secondary to CVA at age  81.  The patient's mother died secondary to coronary artery disease at  the age of 53.   SOCIAL HISTORY:  The patient was previously married but is now is  widower.  As of this year, he has three children who are healthy.  He  does not drink alcohol.   DATA:  Reviewed.  BUN is 57, creatinine is 4.5, serum glucose is 132,  sodium is 152, potassium is 4.4, bicarb is 17, chloride is 122,  creatinine was noted to be 3.16 April 2004, in review of old records.  Chest x-ray reveals no acute disease.   PHYSICAL EXAMINATION:  VITAL SIGNS:  Temperature 98.2, blood pressure  114/54, heart rate 95, respiratory rate 20, O2 sats 96% on room air.  GENERAL:  Well-developed, well-nourished, gentleman in no acute  respiratory distress.  HEENT:  Eyes normocephalic, atraumatic.  Pupils equal, round, reactive  to light and accommodation, extraocular muscles intact bilaterally.  OC/OP clear.  NECK:  No JVD.  No lymphadenopathy, no  thyromegaly.  LUNGS:  Clear to auscultation bilaterally without wheezes or rhonchi.  CARDIOVASCULAR:  Regular rate and rhythm without murmur, gallop or rub,  normal S1 and S2.  ABDOMEN:  Nontender, nondistended, soft, bowel sounds present.  No  hepatosplenomegaly, no rebound, no ascites.  GU:  Normal external genitalia, no evidence of bladder distention or  tenderness with suprapubic palpation.  EXTREMITIES:  No significant cyanosis, clubbing, edema bilateral lower  extremities.  NEUROLOGIC:  5/5 strength bilateral upper and lower extremities.  Intact  sensation detected throughout.  No Babinski.  Alert, oriented x4.   IMPRESSION/PLAN:  1. Severe acute renal failure - The patient has a baseline creatinine      known with 3.6 in April 2006.  The patient, however, is not aware      of said history.  He has not had any previous further evaluation.      It should be noted this creatinine was registered at the time that      the patient was suffering with some obstruction and required      urologic intervention.  It is not clear at this time if the      patient's renal failure is acute or simply represents an      exacerbation of a chronic renal failure.  It is also not clear if      the renal failure is the cause of the patient's nausea and vomiting      or if the patient is simply severely pre-renal, secondary to a      viral gastroenteritis.  Will hydrate the patient aggressively.  We      will administer bicarbonate through his IV fluid, due to his mild      metabolic acidosis.  We will follow his pH very closely.  We will      follow his bicarb very closely.  We will follow his renal function.      Given his history of prostate difficulties, we will obtain a renal      ultrasound.  It should be noted that the patient has had      obstructive symptoms in the past, due to scar tissue, and despite      the fact that he no longer has  a prostate.  We will involved Dr.      Brunilda Payor, his  urologist, should his urinary ultrasound suggest that      this is necessary.  2. Intractable nausea and vomiting - As note above, it is not clear if      this was the inciting event or simply secondary to the patient's      progressive renal failure.  The patient does not have symptoms of      her obstructive uropathy.  It is quite possible that he simply has      contracted a viral gastroenteritis, and this has lead to severe pre-      renal azotemia and would explain his acute renal failure.  We will      hydrate the patient aggressively as discussed above, and we will      follow his renal functions very closely. Will provide symptomatic      relief, otherwise.  3. Hypertension.  This patient's blood pressure is currently well-      controlled.  We will currently hold his triamterine and HCTZ in the      face of his acute renal failure, and we will consider dosing      Toprol, if the patient's blood pressure proves to become elevated,      after adequate volume resuscitation.  4. Tobacco abuse.  We will counsel the patient as to multiple      deleterious effects of ongoing tobacco abuse.  I have advised him      to discontinue smoking immediately.  I am requesting tobacco      cessation consultation, to further encourage this point during the      patient's hospital stay.      Lonia Blood, M.D.  Electronically Signed     JTM/MEDQ  D:  11/26/2006  T:  11/26/2006  Job:  [id number]   cc:   Lorelle Formosa, M.D.  Lindaann Slough, M.D.

## 2010-05-29 NOTE — Op Note (Signed)
NAMEOMER, PUCCINELLI                          ACCOUNT NO.:  [id number]   MEDICAL RECORD NO.:  [id number]                   PATIENT TYPE:  INP   LOCATION:  0155                                 FACILITY:  [facility]   PHYSICIAN:  Lindaann Slough, M.D.               DATE OF BIRTH:  [birth year]   DATE OF PROCEDURE:  08/08/2003  DATE OF DISCHARGE:                                 OPERATIVE REPORT   PREOPERATIVE DIAGNOSIS:  1. Recurrent gross hematuria.  2. Benign prostatic hypertrophy.  3. Bladder outlet obstruction.  4. Chronic renal insufficiency.  5. Prostate cancer.   POSTOPERATIVE DIAGNOSIS:  1. Recurrent gross hematuria.  2. Benign prostatic hypertrophy.  3. Bladder outlet obstruction.  4. Chronic renal insufficiency.  5. Prostate cancer.   PROCEDURES PERFORMED:  1. Simple suprapubic prostatectomy.  2. Placement of a left-sided double-J stent.   SURGEON:  Lindaann Slough, M.D.   RESIDENT SURGEON:  Thyra Breed, M.D.   ANESTHESIA:  General endotracheal.   SPECIMENS:  Prostate adenoma.   ESTIMATED BLOOD LOSS:  700 cc.   DRAINS:  1. A 24 French suprapubic catheter.  2. A 24 French Foley catheter.  3. A 10 French Blake drain to bulb suction.   COMPLICATIONS:  None.   INDICATIONS FOR PROCEDURE:  [name] is a pleasant 77 year old male who was  evaluated approximately two months ago for gross hematuria.  Patient's  creatinine was also found to be elevated at 6.1.  A renal ultrasound showed  bilateral hydronephrosis.  A Foley catheter was placed, which drained two  liters of urine.  The catheter was left in place, and bilateral percutaneous  nephrostomies were placed with a decrease in the creatinine to 3.5.  A CT  scan of the abdomen and pelvis showed a markedly enlarged prostate, probably  greater than 200 gm.  Given the size of his prostate and his multiple  sequelae of bladder outlet obstruction, he is consented to undergo a simple  suprapubic prostatectomy.  All the  risks, benefits and alternatives of the  procedure have been described in detail, and the patient is willing to  proceed.   PROCEDURE IN DETAIL:  Following identification by his arm bracelet, the  patient was brought to the operating room and placed in the supine position.  Here, he underwent successful induction of general endotracheal anesthesia  and received preoperative antibiotics.  His lower abdomen was then shaved,  and his entire abdomen and genitalia were then prepped in Betadine and  draped in the usual sterile fashion.  We then created a midline incision  from just below the umbilicus to the pubic symphysis.  The incision was  carried down through the subcutaneous tissue using Bovie electrocautery.  The fascia was then encountered and entered, using the Bovie.  This exposed  the midline rectus muscles.  The muscles were then split, which exposed the  anterior surface of the bladder.  The bladder was further exposed in the  space of Retzius  by bluntly pushing the peritoneal reflection upwards and  the perivesical tissue laterally and downward.  On the anterior surface of  the bladder, we then placed two stay sutures of 2-0 Vicryl and created a  vertical incision in the bladder.  The bladder wall was quite thickened,  consistent with the patient's known bladder outlet obstruction.  Once the 2  cm incision in the bladder was created using the Bovie, the bladder was  further opened by stretching.  There was excellent hemostasis.  Once inside  the bladder, any bleeding and urine was removed.  This exposed a right-sided  double-J stent, exiting from the right ureteral orifice, which had been  placed by special procedures earlier.  In addition, there was a wire exiting  the left ureteral orifice, also placed by special procedures, and they were  unable to place a stent.  Our plan would be to place a stent retrograde  prior to completion of the procedure.  Therefore, the ureteral  orifices as  well as the trigone were easily identified.  A large adenoma was seen to be  protruding into the bladder from the large hypertrophied median lobe of the  prostate.  Using a surgeon's finger, a plane was then developed surrounding  the adenoma into the prostatic capsule.  The surgeon's finger was used to  sweep and roll laterally, alternating from side to side.  The enucleation of  both lobes proceeded in a standard fashion.  The surgeon's finger was kept  close to the adenoma at all times, continuing posteriorly across the midline  and behind the middle lobe.  Some adhesions were met during enucleation,  which were taken down using Bovie electrocautery.  The adenoma was then  grasped with Babcock clamp, and this gave rise to the initial specimen of  the medium lobe as well as the left lateral lobe of the prostate.  We then  continued our enucleation by delivering the enucleated right lobe.  There  was bleeding at this time from the prostatic fossa, although most appeared  to be venous.  A pack was placed within the area within the bladder, and  most bleeding was seen to be coming from the 5 and 7 o'clock locations on  the bladder neck.  A 2-0 chromic suture was then used in a figure-of-eight  fashion at both the 5 and 7 o'clock locations on the bladder to provide  hemostasis from the enucleation.  At this time, using the wire in place  exiting the left ureteral orifice, an 8 x 26 double-J ureteral stent was  passed in a retrograde fashion without difficulty.  The wire was then  removed antegrade through exiting the patient's left flank.  A second  cystotomy puncture incision was then created to the right lateral aspect of  the initial bladder opening for placement of a suprapubic catheter.  An  incision was then created to the right lower aspect of the abdominal  incision.  A Babcock clamp was used.  A Kelly clamp was used to pull a 24 [place] Foley catheter through the opening  through the skin, fascia, and  muscle, and through the cystotomy.  Then 30 ml of fluid were then placed in  the Foley catheter balloon.  A 2-0 chromic suture was then used in a purse-  string fashion to secure the suprapubic tube to the bladder.  Care was taken  not to puncture the suprapubic  catheter balloon.  Once we were satisfied  with hemostasis from the prostatic fossa, we then placed a 24 French Foley  catheter per the urethra, which was also in the bladder, and 30 ml used to  fill the balloon.  Care was taken not to pull the catheter into the  prostatic fossa.  Care was also taken to make sure there was no obstruction  of the ureteral orifices by either catheter balloons.  Following this, we  closed the bladder in a two-layer fashion.  The first layer included a 0  Vicryl suture to reapproximate the bladder to the mucosa fascia.  The second  layer included a 2-0 Vicryl running suture, which embrocated the bladder  muscle to cover the first suture line.  This appeared to be a water-tight  anastomosis.  Following this, we created a puncture incision using the 11  blade knife to the left lower aspect of the incision and placed a #10 Blake  drain across the cystotomy incision.  This was sutured to the skin using  nylon suture.  We then performed closure of the fascia with a #1 running PDS  suture.  The subcutaneous tissues were then copiously irrigated, as was the  abdomen throughout the case prior to fascial closure.  Surgical clips were  used to close the skin.  The incision was then washed and dried, and a  sterile dressing applied.  All sponge, needle, and instrument counts were  correct x2.  The patient tolerated the procedure well, and there were no  complications.   Dr. Brunilda Payor was present and participated in the entire procedure, as he was the  responsible surgeon.   DISPOSITION:  After awakening from general anesthesia, the patient was  transported to the post anesthesia care  unit in stable condition.  From  here, he will be transferred to the ICU step-down for further postoperative  care.     Thyra Breed, MD                            Lindaann Slough, M.D.    EG/MEDQ  D:  08/09/2003  T:  08/09/2003  Job:  [id number]

## 2010-05-29 NOTE — H&P (Signed)
[name], Rich                          ACCOUNT NO.:  [id number]   MEDICAL RECORD NO.:  [id number]                   PATIENT TYPE:  INP   LOCATION:  0009                                 FACILITY:  [facility]   PHYSICIAN:  Lindaann Slough, M.D.               DATE OF BIRTH:  1925-05-28   DATE OF ADMISSION:  08/08/2003  DATE OF DISCHARGE:                                HISTORY & PHYSICAL   CHIEF COMPLAINT:  Gross hematuria and urinary retention.   HISTORY OF PRESENT ILLNESS:  The patient is a 77 year old male who was seen  in the office about two months ago for gross hematuria.  He was scheduled  for CT scan of the abdomen with IV contrast.  Preprocedure BUN and  creatinine were elevated at 61 and 6.2 respectively.  A renal ultrasound  showed bilateral hydronephrosis.  A Foley catheter was then placed in the  bladder and drained 2000 mL of fluid.  The catheter was left indwelling and  then bilateral percutaneous nephrostomies were done.  The BUN and creatinine  have come down to 28 and 3.5.  A CT scan of the abdomen and pelvis without  contrast showed markedly enlarged prostate.  Prior to this episode of  urinary retention, the patient had denied hesitancy or straining on  urination.  He always had stated that he was voiding well with a good flow.  The patient is now admitted for suprapubic prostatectomy.   PAST MEDICAL HISTORY:  Is positive for hypertension.  He had a positive  prostate biopsy in November 2004 for one focus of adenocarcinoma.   MEDICATIONS:  He is on:  1. Toprol XL 100 mg daily.  2. Triamterene/HCTZ 75/50 one daily.   ALLERGIES:  HE HAS NO KNOWN DRUG ALLERGY.   SOCIAL HISTORY:  He is married and has 3 children.  Quit smoking several  years ago and had smoked about a pack a day for 12 years.  Does not drink.   FAMILY HISTORY:  Is positive for hypertension and kidney stone.  His father  died of a stroke at age 73.  His mother died at age 37 of heart disease and  he had 3 siblings, one died in an accident.   REVIEW OF SYSTEMS:  He has no cough, no shortness of breath, no hemoptysis.  CARDIOVASCULAR:  No palpitation or chest pain.  GI:  No nausea, no vomiting,  no diarrhea or constipation.  GU:  The patient had denied frequency or  straining on urination and he had one episode of gross hematuria.   PHYSICAL EXAMINATION:  His blood pressure 133/64, pulse 66, respirations 14,  temperature 97.  HEAD:  Normal.  Pupils are equal and reactive to light and accommodation.  EARS, NOSE AND THROAT:  Within normal limits.  NECK:  Supple, no cervical lymph nodes, no thyromegaly.  CHEST:  Symmetrical, lungs are fully expanded and clear to percussion  and  auscultation.  HEART:  Regular rhythm, no murmur, no gallops.  ABDOMEN:  Soft and protuberant.  He has no hepatomegaly, no splenomegaly.  Bladder is not distended.  He has no inguinal hernia.  PENIS:  Normal.  He has a Foley catheter that is draining clear urine.  SCROTUM:  Is normal in appearance.  He has no hydrocele, he has no  testicular mass.  CORDS AND EPIDIDYMIS:  Are within normal limits.  EXTREMITIES:  Within normal limits.  He has no pedal edema, no deformities  and he has good peripheral pulses.  RECTAL EXAMINATION:  Sphincter tone is normal, prostate is enlarged, firm,  no nodules and seminal vesicles are not palpable.   ADMITTING DIAGNOSES:  1. Urinary retention.  2. Bilateral hydronephrosis.  3. Benign prostatic hypertrophy.  4. Adenocarcinoma of prostate.  5. Hypertension.                                               Lindaann Slough, M.D.    MN/MEDQ  D:  08/08/2003  T:  08/08/2003  Job:  [id number]

## 2010-05-29 NOTE — Op Note (Signed)
[name], [name]                ACCOUNT NO.:  [id number]   MEDICAL RECORD NO.:  [id number]          PATIENT TYPE:  AMB   LOCATION:  [facility]                         FACILITY:  [facility]   PHYSICIAN:  Lindaann Slough, M.D.  DATE OF BIRTH:  1925/11/08   DATE OF PROCEDURE:  05/01/2004  DATE OF DISCHARGE:                                 OPERATIVE REPORT   PREOPERATIVE DIAGNOSIS:  Chronic urinary retention.   POSTOPERATIVE DIAGNOSIS:  Chronic urinary retention.   PROCEDURE:  Cystoscopy, TUR of residual prostate tissue at the apex.   SURGEON:  Lindaann Slough, M.D.   ANESTHESIA:  General.   INDICATIONS FOR PROCEDURE:  The patient is a 77 year old male who had a  retropubic prostatectomy in July of 2005.  Postoperatively, he was voiding  well; however, he was found to have a large postvoid residual of about 400  cc.  It was difficult to catheterize the patient because of obstruction in  the posterior urethra.  Cystoscopy showed some residual tissue at the apex.  He was treated with Urecholine, and he was still unable to completely empty  his bladder.  He is scheduled today for TUR of the residual tissue at the  apex.   DESCRIPTION OF PROCEDURE:  Under general anesthesia, the patient was prepped  and draped and placed in the dorsal lithotomy position.   A #22 Wappler cystoscope was inserted in the bladder.  There was some  residual tissue at the apex, with obstruction of the prostatic urethra.  The  bladder was trabeculated.  There is no stone or tumor in the bladder.  The  ureteral orifices are in the normal position and shape.  The cystoscope was  removed.  A #28 Iglesias resectoscope was inserted in the bladder, and  resection of the residual tissue at the apex was done.  Hemostasis was  secured with electrocautery.  The prostatic chips were irrigated out of the  bladder.  From the verumontanum, the bladder neck is open.  The resectoscope  was then removed, and a #20 [place] Foley  catheter was inserted in the  bladder.   The patient tolerated the procedure well and left the OR in satisfactory  condition to the postanesthesia care unit.      MN/MEDQ  D:  05/01/2004  T:  05/01/2004  Job:  [id number]

## 2010-05-29 NOTE — Op Note (Signed)
[name], [name]                          ACCOUNT NO.:  [id number]   MEDICAL RECORD NO.:  [id number]                   PATIENT TYPE:  AMB   LOCATION:  [facility]                                 FACILITY:  [facility]   PHYSICIAN:  Lindaann Slough, M.D.               DATE OF BIRTH:  Sep 10, 1925   DATE OF PROCEDURE:  06/21/2003  DATE OF DISCHARGE:                                 OPERATIVE REPORT   PREOPERATIVE DIAGNOSES:  1. Urinary insufficiency.  2. Bilateral hydronephrosis.   POSTOPERATIVE DIAGNOSES:  1. Urinary insufficiency.  2. Bilateral hydronephrosis.   PROCEDURE DONE:  Cystoscopy.   SURGEON:  Lindaann Slough, M.D.   INDICATION:  The patient is a 77 year old male, who had 1 episode of gross  hematuria about 2 weeks ago.  He was scheduled for a CT scan of the abdomen  and pelvis, BUN and creatinine done before the CT scan were markedly  elevated at 61 and 6.1.  A Foley catheter was then inserted in the bladder  and drained 2000 mL of urine.  Repeat BUN and creatinine are still elevated  at 56 and 6.1.  Renal ultrasound showed severe hydronephrosis.  The patient  is scheduled today for a cystoscopy and insertion of double-J catheters.   Under general anesthesia, the patient was prepped and draped and placed in  the dorsal lithotomy position.  A #22 Wappler cystoscope was inserted in the  bladder.  The patient had marked prostatic hypertrophy of the prostate gland  with a large median lobe.  The bladder is heavy trabeculated with cellules  and diverticula.  There is no stone or tumor in the bladder nor in the  diverticula.  The ureteral orifices could not be visualized.  After careful  inspection, it was then decided to end the procedure.  The patient will have  bilateral nephrostomies.   The bladder was then emptied and the cystoscope removed.  A #18 French Foley  catheter was then inserted in the bladder.   The patient tolerated the procedure well and left the OR in  satisfactory  condition to postanesthesia care unit.                                               Lindaann Slough, M.D.    MN/MEDQ  D:  06/21/2003  T:  06/21/2003  Job:  [id number]

## 2010-05-29 NOTE — Discharge Summary (Signed)
NAMEBLAIRE, [name]                          ACCOUNT NO.:  [id number]   MEDICAL RECORD NO.:  [id number]                   PATIENT TYPE:  INP   LOCATION:  0155                                 FACILITY:  [facility]   PHYSICIAN:  Lindaann Slough, M.D.               DATE OF BIRTH:  [birth year]   DATE OF ADMISSION:  08/08/2003  DATE OF DISCHARGE:  08/14/2003                                 DISCHARGE SUMMARY   DISCHARGE DIAGNOSES:  1. Urinary retention.  2. Adenocarcinoma of prostate.  3. Bilateral hydronephrosis.  4. Hypertension.   PROCEDURES DONE:  1. Suprapubic prostatectomy.  2. Antegrade insertion of right double-J catheter and insertion of a left     guide wire through the nephrostomy catheter into the bladder.   Patient is a 76 year old male who was seen about two months ago for gross  hematuria.  During the workup he was found to have elevated BUN and  creatinine at 61 and 6.2 respectively.  A renal ultrasound showed bilateral  hydronephrosis.  He was then catheterized for 2000 mL of fluid.  The  catheter was left indwelling.  Cystoscopy showed marked hypertrophy of the  prostate gland and the ureteral orifices could not be catheterized.  Bilateral percutaneous nephrostomies were then done.  The BUN and creatinine  had come down to 28 and 3.5 and the patient was admitted on August 08, 2003,  for suprapubic prostatectomy.  Prior to the procedure, a left-hand double-J  catheter was placed on the right side and a guide wire was passed through  the left nephrostomy into the bladder.   On physical examination, his blood pressure was 133/64, pulse 66,  respirations 14, temperature 97.  HEAD:  Is normal.  Pupils equal and reactive to light and accommodation.  ENT:  Within normal limits.  NECK:  Supple, no cervical lymph nodes, no thyromegaly.  CHEST:  Symmetrical.  LUNGS:  Fully expanded and clear to percussion and auscultation.  HEART:  Regular rhythm.  ABDOMEN:  Soft and  protuberant.  Kidneys are not palpable.  He has no  inguinal hernia.  Bowel sounds are normal.  PENIS:  Uncircumcised and he had a Foley catheter that was draining clear  urine.   His hemoglobin on admission was 9.6, hematocrit 29.9 and WBC 9.4.  Sodium  140, potassium 3.9, BUN 25, creatinine 3.4.  PT and PTT were within normal  limits.  Urinalysis showed 11-20 WBCs, 3-6 RBCs and a few bacteria.  Urine  culture was positive for staph species.  Chest x-ray showed no evidence of  active disease.  Patient had a suprapubic prostatectomy done on August 08, 2003.  A left-hand double-J catheter was then placed over the guide wire  that was preplaced in the bladder before the surgery, and the guide wire was  removed.  The patient received 2 units of packed cells during the surgery.  The prostate then measured  286 grams and showed rare foci of adenocarcinoma.  Postoperatively, the patient was bleeding through the Foley catheter that  was placed in the bladder.  His hemoglobin on the first day postop was 8.9  and on the second day postoperatively went down to 7.2.  He was then given 2  more units of packed cells and on the third day postop his hemoglobin was  still 8.1 and he received 2 more units of packed cells, for a total of 6.  His creatinine went down to 2.3 on August 13, 2003.  The Foley catheter was  removed on August 13, 2003, and as well as the nephrostomy catheter.  On  August 14, 2003, he was afebrile, he was eating well.  The suprapubic  catheter was draining well, his urine was grossly clear.  The wound was  healing well.  On August 14, 2003, his BUN was 16 and creatinine of 2.4.  Hemoglobin is 11 with a hematocrit of 33.2 and WBC 14.8.  He was eating  well.  He was then discharged home on:  1. Macrobid 100 mg twice a day.  2. Ferrous sulfate 1 tablet three times a day.  3. Toprol XL 100 mg daily.  4. Triamterene/HCTZ 75/50 one daily.  He was instructed not to do any lifting, straining  or driving until further  advised.   CONDITION ON DISCHARGE:  Improved.   DISCHARGE DIET:  Regular.   Patient will be seen in the office in two days for staple removal.   PLAN:  Repeat his PSA and treat him with expectant management __________.                                               Lindaann Slough, M.D.    MN/MEDQ  D:  08/14/2003  T:  08/15/2003  Job:  [id number]

## 2010-06-12 ENCOUNTER — Other Ambulatory Visit: Payer: Self-pay | Admitting: Nephrology

## 2010-06-12 ENCOUNTER — Encounter (HOSPITAL_COMMUNITY): Payer: Medicare Other | Attending: Nephrology

## 2010-06-12 DIAGNOSIS — N184 Chronic kidney disease, stage 4 (severe): Secondary | ICD-10-CM | POA: Insufficient documentation

## 2010-06-12 DIAGNOSIS — D638 Anemia in other chronic diseases classified elsewhere: Secondary | ICD-10-CM | POA: Insufficient documentation

## 2010-06-12 LAB — IRON AND TIBC
Iron: 48 ug/dL (ref 42–135)
Saturation Ratios: 21 % (ref 20–55)
TIBC: 228 ug/dL (ref 215–435)
UIBC: 180 ug/dL

## 2010-06-12 LAB — FERRITIN: Ferritin: 344 ng/mL — ABNORMAL HIGH (ref 22–322)

## 2010-06-26 ENCOUNTER — Other Ambulatory Visit: Payer: Self-pay | Admitting: Nephrology

## 2010-06-26 ENCOUNTER — Encounter (HOSPITAL_COMMUNITY): Payer: Medicare Other

## 2010-07-24 ENCOUNTER — Encounter (HOSPITAL_COMMUNITY): Payer: Medicare Other | Attending: Nephrology

## 2010-07-24 ENCOUNTER — Other Ambulatory Visit: Payer: Self-pay | Admitting: Nephrology

## 2010-07-24 DIAGNOSIS — D638 Anemia in other chronic diseases classified elsewhere: Secondary | ICD-10-CM | POA: Insufficient documentation

## 2010-07-24 DIAGNOSIS — N184 Chronic kidney disease, stage 4 (severe): Secondary | ICD-10-CM | POA: Insufficient documentation

## 2010-07-27 LAB — POCT HEMOGLOBIN-HEMACUE: Hemoglobin: 12 g/dL — ABNORMAL LOW (ref 13.0–17.0)

## 2010-08-21 ENCOUNTER — Other Ambulatory Visit: Payer: Self-pay | Admitting: Nephrology

## 2010-08-21 ENCOUNTER — Encounter (HOSPITAL_COMMUNITY): Payer: Medicare Other | Attending: Nephrology

## 2010-08-21 DIAGNOSIS — N184 Chronic kidney disease, stage 4 (severe): Secondary | ICD-10-CM | POA: Insufficient documentation

## 2010-08-21 DIAGNOSIS — D638 Anemia in other chronic diseases classified elsewhere: Secondary | ICD-10-CM | POA: Insufficient documentation

## 2010-08-21 LAB — POCT HEMOGLOBIN-HEMACUE: Hemoglobin: 11.5 g/dL — ABNORMAL LOW (ref 13.0–17.0)

## 2010-09-18 ENCOUNTER — Other Ambulatory Visit: Payer: Self-pay | Admitting: Nephrology

## 2010-09-18 ENCOUNTER — Encounter (HOSPITAL_COMMUNITY)
Admission: RE | Admit: 2010-09-18 | Discharge: 2010-09-18 | Disposition: A | Discharge status: Home / Self Care | Payer: Medicare Other | Source: Ambulatory Visit | Attending: Nephrology | Admitting: Nephrology

## 2010-09-18 DIAGNOSIS — D638 Anemia in other chronic diseases classified elsewhere: Secondary | ICD-10-CM | POA: Insufficient documentation

## 2010-09-18 DIAGNOSIS — N184 Chronic kidney disease, stage 4 (severe): Secondary | ICD-10-CM | POA: Insufficient documentation

## 2010-09-18 LAB — IRON AND TIBC: TIBC: 224 ug/dL (ref 215–435)

## 2010-09-18 LAB — FERRITIN: Ferritin: 328 ng/mL — ABNORMAL HIGH (ref 22–322)

## 2010-10-01 LAB — IRON AND TIBC: Iron: 30 — ABNORMAL LOW

## 2010-10-01 LAB — CBC
HCT: 29.6 — ABNORMAL LOW
Hemoglobin: 9.7 — ABNORMAL LOW
MCV: 87.4
Platelets: 381
WBC: 8.4

## 2010-10-02 LAB — CBC
HCT: 24.9 — ABNORMAL LOW
Hemoglobin: 8 — ABNORMAL LOW
MCHC: 32.2
MCV: 81.6
RDW: 17.5 — ABNORMAL HIGH

## 2010-10-06 LAB — CBC
HCT: 36.8 — ABNORMAL LOW
Hemoglobin: 11.7 — ABNORMAL LOW
MCV: 80.6
Platelets: 323
RDW: 22.6 — ABNORMAL HIGH

## 2010-10-12 LAB — POCT HEMOGLOBIN-HEMACUE
Hemoglobin: 15.6
Hemoglobin: 11.3 — ABNORMAL LOW

## 2010-10-13 LAB — POCT HEMOGLOBIN-HEMACUE: Hemoglobin: 12.5 — ABNORMAL LOW

## 2010-10-16 ENCOUNTER — Encounter (HOSPITAL_COMMUNITY)
Admission: RE | Admit: 2010-10-16 | Discharge: 2010-10-16 | Disposition: A | Discharge status: Home / Self Care | Payer: Medicare Other | Source: Ambulatory Visit | Attending: Nephrology | Admitting: Nephrology

## 2010-10-16 DIAGNOSIS — D638 Anemia in other chronic diseases classified elsewhere: Secondary | ICD-10-CM | POA: Insufficient documentation

## 2010-10-16 DIAGNOSIS — N184 Chronic kidney disease, stage 4 (severe): Secondary | ICD-10-CM | POA: Insufficient documentation

## 2010-10-16 LAB — POCT HEMOGLOBIN-HEMACUE
Hemoglobin: 12.2 g/dL — ABNORMAL LOW (ref 13.0–17.0)
Hemoglobin: 12.7 g/dL — ABNORMAL LOW (ref 13.0–17.0)

## 2010-10-20 LAB — CBC
HCT: 14.2 — ABNORMAL LOW
HCT: 20.6 — ABNORMAL LOW
HCT: 23.7 — ABNORMAL LOW
HCT: 24.5 — ABNORMAL LOW
HCT: 24.6 — ABNORMAL LOW
HCT: 25 — ABNORMAL LOW
HCT: 25 — ABNORMAL LOW
HCT: 25.8 — ABNORMAL LOW
HCT: 27 — ABNORMAL LOW
HCT: 27.3 — ABNORMAL LOW
HCT: 27.4 — ABNORMAL LOW
HCT: 27.5 — ABNORMAL LOW
HCT: 30.2 — ABNORMAL LOW
HCT: 31.1 — ABNORMAL LOW
HCT: 32.7 — ABNORMAL LOW
HCT: 36 — ABNORMAL LOW
Hemoglobin: 10.1 — ABNORMAL LOW
Hemoglobin: 10.8 — ABNORMAL LOW
Hemoglobin: 4.6 — CL
Hemoglobin: 6.7 — CL
Hemoglobin: 8.1 — ABNORMAL LOW
Hemoglobin: 9 — ABNORMAL LOW
Hemoglobin: 9.2 — ABNORMAL LOW
Hemoglobin: 9.2 — ABNORMAL LOW
Hemoglobin: 9.8 — ABNORMAL LOW
MCHC: 32.4
MCHC: 32.6
MCHC: 33
MCHC: 33
MCHC: 33.1
MCHC: 33.2
MCHC: 33.2
MCHC: 33.3
MCHC: 33.3
MCHC: 33.4
MCHC: 33.5
MCHC: 33.5
MCHC: 33.7
MCV: 81.9
MCV: 83.5
MCV: 84.2
MCV: 84.3
MCV: 84.4
MCV: 84.5
MCV: 84.7
MCV: 85
MCV: 85
MCV: 85.1
MCV: 85.1
MCV: 85.3
MCV: 85.5
MCV: 86
Platelets: 190
Platelets: 203
Platelets: 207
Platelets: 208
Platelets: 208
Platelets: 210
Platelets: 210
Platelets: 214
Platelets: 215
Platelets: 216
Platelets: 216
Platelets: 273
RBC: 1.78 — ABNORMAL LOW
RBC: 2.23 — ABNORMAL LOW
RBC: 2.46 — ABNORMAL LOW
RBC: 2.88 — ABNORMAL LOW
RBC: 2.92 — ABNORMAL LOW
RBC: 3.06 — ABNORMAL LOW
RBC: 3.13 — ABNORMAL LOW
RBC: 3.15 — ABNORMAL LOW
RBC: 3.19 — ABNORMAL LOW
RBC: 3.27 — ABNORMAL LOW
RBC: 3.48 — ABNORMAL LOW
RBC: 3.83 — ABNORMAL LOW
RDW: 16.7 — ABNORMAL HIGH
RDW: 16.9 — ABNORMAL HIGH
RDW: 17.1 — ABNORMAL HIGH
RDW: 17.3 — ABNORMAL HIGH
RDW: 17.3 — ABNORMAL HIGH
RDW: 17.3 — ABNORMAL HIGH
RDW: 17.4 — ABNORMAL HIGH
RDW: 17.4 — ABNORMAL HIGH
RDW: 17.5 — ABNORMAL HIGH
RDW: 18.1 — ABNORMAL HIGH
RDW: 18.3 — ABNORMAL HIGH
RDW: 20 — ABNORMAL HIGH
WBC: 10.5
WBC: 10.8 — ABNORMAL HIGH
WBC: 11.1 — ABNORMAL HIGH
WBC: 11.1 — ABNORMAL HIGH
WBC: 11.4 — ABNORMAL HIGH
WBC: 11.6 — ABNORMAL HIGH
WBC: 12.2 — ABNORMAL HIGH
WBC: 12.8 — ABNORMAL HIGH
WBC: 13.2 — ABNORMAL HIGH
WBC: 15.7 — ABNORMAL HIGH
WBC: 20.5 — ABNORMAL HIGH
WBC: 22 — ABNORMAL HIGH
WBC: 22.4 — ABNORMAL HIGH
WBC: 27.9 — ABNORMAL HIGH

## 2010-10-20 LAB — CROSSMATCH: ABO/RH(D): B POS

## 2010-10-20 LAB — BASIC METABOLIC PANEL
BUN: 23
BUN: 31 — ABNORMAL HIGH
BUN: 33 — ABNORMAL HIGH
CO2: 19
CO2: 25
CO2: 28
Calcium: 7.6 — ABNORMAL LOW
Calcium: 7.9 — ABNORMAL LOW
Calcium: 8.5
Chloride: 107
Chloride: 109
Chloride: 112
Chloride: 113 — ABNORMAL HIGH
Chloride: 114 — ABNORMAL HIGH
Creatinine, Ser: 3.13 — ABNORMAL HIGH
Creatinine, Ser: 3.17 — ABNORMAL HIGH
Creatinine, Ser: 3.93 — ABNORMAL HIGH
GFR calc Af Amer: 17 — ABNORMAL LOW
GFR calc Af Amer: 18 — ABNORMAL LOW
GFR calc Af Amer: 19 — ABNORMAL LOW
GFR calc Af Amer: 21 — ABNORMAL LOW
GFR calc Af Amer: 23 — ABNORMAL LOW
GFR calc non Af Amer: 16 — ABNORMAL LOW
GFR calc non Af Amer: 18 — ABNORMAL LOW
GFR calc non Af Amer: 19 — ABNORMAL LOW
Glucose, Bld: 128 — ABNORMAL HIGH
Glucose, Bld: 85
Potassium: 3.2 — ABNORMAL LOW
Potassium: 3.7
Potassium: 3.8
Sodium: 139
Sodium: 142

## 2010-10-20 LAB — DIFFERENTIAL
Basophils Absolute: 0
Eosinophils Absolute: 0 — ABNORMAL LOW
Lymphocytes Relative: 3 — ABNORMAL LOW
Monocytes Relative: 4
Neutro Abs: 20.4 — ABNORMAL HIGH

## 2010-10-20 LAB — TSH: TSH: 0.541

## 2010-10-20 LAB — HEMOGLOBIN A1C: Hgb A1c MFr Bld: 5.3

## 2010-10-20 LAB — POCT I-STAT 3, ART BLOOD GAS (G3+)
Operator id: 192351
Patient temperature: 98.7
pCO2 arterial: 24.5 — ABNORMAL LOW
pH, Arterial: 7.449

## 2010-10-20 LAB — MAGNESIUM: Magnesium: 3.6 — ABNORMAL HIGH

## 2010-10-20 LAB — BLOOD GAS, ARTERIAL
Acid-base deficit: 3.1 — ABNORMAL HIGH
Bicarbonate: 20.2
FIO2: 0.21
O2 Saturation: 97.3
Patient temperature: 98.6
TCO2: 21.2
pO2, Arterial: 124 — ABNORMAL HIGH

## 2010-10-20 LAB — I-STAT 8, (EC8 V) (CONVERTED LAB)
Acid-base deficit: 8 — ABNORMAL HIGH
Chloride: 122 — ABNORMAL HIGH
Glucose, Bld: 132 — ABNORMAL HIGH
TCO2: 18
pCO2, Ven: 33.8 — ABNORMAL LOW
pH, Ven: 7.314 — ABNORMAL HIGH

## 2010-10-20 LAB — URINE CULTURE: Colony Count: 80000

## 2010-10-20 LAB — URINALYSIS, ROUTINE W REFLEX MICROSCOPIC
Glucose, UA: NEGATIVE
Ketones, ur: 40 — AB
Nitrite: POSITIVE — AB
Protein, ur: >300 — AB

## 2010-10-20 LAB — CK TOTAL AND CKMB (NOT AT ARMC)
CK, MB: 4.2 — ABNORMAL HIGH
Total CK: 75

## 2010-10-20 LAB — PHOSPHORUS: Phosphorus: 3.7

## 2010-10-20 LAB — IRON AND TIBC
Iron: 13 — ABNORMAL LOW
Saturation Ratios: 5 — ABNORMAL LOW
TIBC: 272

## 2010-10-20 LAB — PROTIME-INR: Prothrombin Time: 14.9

## 2010-10-20 LAB — URINE MICROSCOPIC-ADD ON

## 2010-10-20 LAB — RENAL FUNCTION PANEL
BUN: 25 — ABNORMAL HIGH
Calcium: 7.7 — ABNORMAL LOW
Glucose, Bld: 94
Phosphorus: 3.5
Potassium: 3.8

## 2010-10-20 LAB — POCT I-STAT CREATININE: Creatinine, Ser: 4.5 — ABNORMAL HIGH

## 2010-11-13 ENCOUNTER — Other Ambulatory Visit: Payer: Self-pay | Admitting: Nephrology

## 2010-11-13 ENCOUNTER — Encounter (HOSPITAL_COMMUNITY)
Admission: RE | Admit: 2010-11-13 | Discharge: 2010-11-13 | Disposition: A | Discharge status: Home / Self Care | Payer: Medicare Other | Source: Ambulatory Visit | Attending: Nephrology | Admitting: Nephrology

## 2010-11-13 DIAGNOSIS — D638 Anemia in other chronic diseases classified elsewhere: Secondary | ICD-10-CM | POA: Insufficient documentation

## 2010-11-13 DIAGNOSIS — N184 Chronic kidney disease, stage 4 (severe): Secondary | ICD-10-CM | POA: Insufficient documentation

## 2010-11-25 ENCOUNTER — Other Ambulatory Visit (HOSPITAL_COMMUNITY): Payer: Self-pay | Admitting: *Deleted

## 2010-11-27 ENCOUNTER — Encounter (HOSPITAL_COMMUNITY)
Admission: RE | Admit: 2010-11-27 | Discharge: 2010-11-27 | Disposition: A | Discharge status: Home / Self Care | Payer: Medicare Other | Source: Ambulatory Visit | Attending: Nephrology | Admitting: Nephrology

## 2010-11-27 MED ORDER — DARBEPOETIN ALFA-POLYSORBATE 100 MCG/0.5ML IJ SOLN
INTRAMUSCULAR | Status: AC
  Filled 2010-11-27: qty 0.5

## 2010-11-27 MED ORDER — DARBEPOETIN ALFA-POLYSORBATE 500 MCG/ML IJ SOLN
100.0000 ug | INTRAMUSCULAR | Status: AC
  Administered 2010-11-27: 100 ug via SUBCUTANEOUS
  Filled 2010-11-27: qty 1

## 2010-11-30 MED FILL — Darbepoetin Alfa-Polysorbate 80 Soln Inj 100 MCG/0.5ML: INTRAMUSCULAR | Qty: 0.5 | Status: AC

## 2010-12-22 ENCOUNTER — Other Ambulatory Visit (HOSPITAL_COMMUNITY): Payer: Self-pay | Admitting: *Deleted

## 2010-12-25 ENCOUNTER — Encounter (HOSPITAL_COMMUNITY)
Admission: RE | Admit: 2010-12-25 | Discharge: 2010-12-25 | Disposition: A | Discharge status: Home / Self Care | Payer: Medicare Other | Source: Ambulatory Visit | Attending: Nephrology | Admitting: Nephrology

## 2010-12-25 DIAGNOSIS — D638 Anemia in other chronic diseases classified elsewhere: Secondary | ICD-10-CM | POA: Insufficient documentation

## 2010-12-25 DIAGNOSIS — N184 Chronic kidney disease, stage 4 (severe): Secondary | ICD-10-CM | POA: Insufficient documentation

## 2010-12-25 LAB — IRON AND TIBC
Iron: 32 ug/dL — ABNORMAL LOW (ref 42–135)
TIBC: 221 ug/dL (ref 215–435)

## 2010-12-25 LAB — FERRITIN: Ferritin: 476 ng/mL — ABNORMAL HIGH (ref 22–322)

## 2010-12-25 LAB — POCT HEMOGLOBIN-HEMACUE: Hemoglobin: 9.6 g/dL — ABNORMAL LOW (ref 13.0–17.0)

## 2010-12-25 MED ORDER — DARBEPOETIN ALFA-POLYSORBATE 100 MCG/0.5ML IJ SOLN: 100.0000 ug | INTRAMUSCULAR | Status: DC

## 2010-12-25 MED ORDER — DARBEPOETIN ALFA-POLYSORBATE 100 MCG/0.5ML IJ SOLN
INTRAMUSCULAR | Status: AC
  Administered 2010-12-25: 100 ug via SUBCUTANEOUS
  Filled 2010-12-25: qty 0.5

## 2011-01-18 ENCOUNTER — Other Ambulatory Visit (HOSPITAL_COMMUNITY): Payer: Self-pay | Admitting: *Deleted

## 2011-01-22 ENCOUNTER — Encounter (HOSPITAL_COMMUNITY): Payer: BC Managed Care – PPO

## 2011-01-22 ENCOUNTER — Encounter (HOSPITAL_COMMUNITY)
Admission: RE | Admit: 2011-01-22 | Discharge: 2011-01-22 | Disposition: A | Discharge status: Home / Self Care | Payer: Medicare Other | Source: Ambulatory Visit | Attending: Nephrology | Admitting: Nephrology

## 2011-01-22 DIAGNOSIS — D638 Anemia in other chronic diseases classified elsewhere: Secondary | ICD-10-CM | POA: Insufficient documentation

## 2011-01-22 DIAGNOSIS — N184 Chronic kidney disease, stage 4 (severe): Secondary | ICD-10-CM | POA: Insufficient documentation

## 2011-01-22 MED ORDER — DARBEPOETIN ALFA-POLYSORBATE 500 MCG/ML IJ SOLN
100.0000 ug | INTRAMUSCULAR | Status: DC
  Filled 2011-01-22: qty 1

## 2011-01-22 MED ORDER — FERUMOXYTOL INJECTION 510 MG/17 ML: 510.0000 mg | INTRAVENOUS | Status: DC

## 2011-01-22 MED ORDER — DARBEPOETIN ALFA-POLYSORBATE 100 MCG/0.5ML IJ SOLN
INTRAMUSCULAR | Status: AC
  Administered 2011-01-22: 100 ug via SUBCONJUNCTIVAL
  Filled 2011-01-22: qty 0.5

## 2011-01-22 NOTE — Progress Notes (Signed)
Pt had 4 IV attempts. Does not want to be stuck for IV anymore today. [name] at [employer]. Orders received.

## 2011-01-28 ENCOUNTER — Encounter (HOSPITAL_COMMUNITY)
Admission: RE | Admit: 2011-01-28 | Discharge: 2011-01-28 | Disposition: A | Discharge status: Home / Self Care | Payer: Medicare Other | Source: Ambulatory Visit | Attending: Nephrology | Admitting: Nephrology

## 2011-01-28 MED ORDER — FERUMOXYTOL INJECTION 510 MG/17 ML
INTRAVENOUS | Status: AC
  Administered 2011-01-28: 510 mg via INTRAVENOUS
  Filled 2011-01-28: qty 17

## 2011-01-28 MED ORDER — FERUMOXYTOL INJECTION 510 MG/17 ML
510.0000 mg | INTRAVENOUS | Status: DC
  Administered 2011-01-28: 510 mg via INTRAVENOUS

## 2011-02-18 ENCOUNTER — Other Ambulatory Visit (HOSPITAL_COMMUNITY): Payer: Self-pay | Admitting: Nephrology

## 2011-02-18 ENCOUNTER — Ambulatory Visit (HOSPITAL_COMMUNITY)
Admission: RE | Admit: 2011-02-18 | Discharge: 2011-02-18 | Disposition: A | Discharge status: Home / Self Care | Payer: Medicare Other | Source: Ambulatory Visit | Attending: Nephrology | Admitting: Nephrology

## 2011-02-18 ENCOUNTER — Encounter (HOSPITAL_COMMUNITY)
Admission: RE | Admit: 2011-02-18 | Discharge: 2011-02-18 | Disposition: A | Discharge status: Home / Self Care | Payer: Medicare Other | Source: Ambulatory Visit | Attending: Nephrology | Admitting: Nephrology

## 2011-02-18 DIAGNOSIS — N189 Chronic kidney disease, unspecified: Secondary | ICD-10-CM | POA: Insufficient documentation

## 2011-02-18 DIAGNOSIS — R0602 Shortness of breath: Secondary | ICD-10-CM | POA: Insufficient documentation

## 2011-02-18 DIAGNOSIS — D638 Anemia in other chronic diseases classified elsewhere: Secondary | ICD-10-CM | POA: Insufficient documentation

## 2011-02-18 DIAGNOSIS — N184 Chronic kidney disease, stage 4 (severe): Secondary | ICD-10-CM | POA: Insufficient documentation

## 2011-02-18 DIAGNOSIS — Z01812 Encounter for preprocedural laboratory examination: Secondary | ICD-10-CM | POA: Insufficient documentation

## 2011-02-18 DIAGNOSIS — I517 Cardiomegaly: Secondary | ICD-10-CM | POA: Insufficient documentation

## 2011-02-18 DIAGNOSIS — N185 Chronic kidney disease, stage 5: Secondary | ICD-10-CM

## 2011-02-18 DIAGNOSIS — R0989 Other specified symptoms and signs involving the circulatory and respiratory systems: Secondary | ICD-10-CM | POA: Insufficient documentation

## 2011-02-18 MED ORDER — FERUMOXYTOL INJECTION 510 MG/17 ML
510.0000 mg | INTRAVENOUS | Status: AC
  Administered 2011-02-18: 510 mg via INTRAVENOUS

## 2011-02-18 MED ORDER — FERUMOXYTOL INJECTION 510 MG/17 ML
INTRAVENOUS | Status: AC
  Filled 2011-02-18: qty 17

## 2011-02-18 MED ORDER — DARBEPOETIN ALFA-POLYSORBATE 100 MCG/0.5ML IJ SOLN
100.0000 ug | INTRAMUSCULAR | Status: DC
  Administered 2011-02-18: 100 ug via SUBCUTANEOUS
  Filled 2011-02-18: qty 0.5

## 2011-02-19 ENCOUNTER — Encounter (HOSPITAL_COMMUNITY): Payer: BC Managed Care – PPO

## 2011-02-19 LAB — POCT HEMOGLOBIN-HEMACUE: Hemoglobin: 9.3 g/dL — ABNORMAL LOW (ref 13.0–17.0)

## 2011-02-19 MED FILL — Ferumoxytol Inj 510 MG/17ML (30 MG/ML) (Elemental Fe): INTRAVENOUS | Qty: 17 | Status: AC

## 2011-02-28 ENCOUNTER — Ambulatory Visit (INDEPENDENT_AMBULATORY_CARE_PROVIDER_SITE_OTHER): Payer: Medicare Other | Admitting: Family Medicine

## 2011-02-28 ENCOUNTER — Ambulatory Visit: Payer: BC Managed Care – PPO

## 2011-02-28 VITALS — BP 137/76 | HR 80 | Temp 98.1°F | Resp 16 | Ht 67.0 in | Wt 156.0 lb

## 2011-02-28 DIAGNOSIS — R0989 Other specified symptoms and signs involving the circulatory and respiratory systems: Secondary | ICD-10-CM

## 2011-02-28 DIAGNOSIS — R05 Cough: Secondary | ICD-10-CM

## 2011-02-28 DIAGNOSIS — R059 Cough, unspecified: Secondary | ICD-10-CM

## 2011-02-28 DIAGNOSIS — Z720 Tobacco use: Secondary | ICD-10-CM

## 2011-02-28 DIAGNOSIS — J40 Bronchitis, not specified as acute or chronic: Secondary | ICD-10-CM

## 2011-02-28 MED ORDER — HYDROCOD POLST-CHLORPHEN POLST 10-8 MG/5ML PO LQCR: 5.0000 mL | Freq: Every evening | ORAL | Status: DC | PRN

## 2011-02-28 MED ORDER — AZITHROMYCIN 250 MG PO TABS: ORAL_TABLET | ORAL | Status: AC

## 2011-02-28 MED ORDER — HYDROCOD POLST-CHLORPHEN POLST 10-8 MG/5ML PO LQCR: 5.0000 mL | Freq: Every evening | ORAL | Status: AC | PRN

## 2011-02-28 NOTE — Progress Notes (Signed)
[facility]:  Office Visit  Chief Complaint:  Chief Complaint  Patient presents with  . Cough    HPI: [name] is a 78 y.o. male who complains of  10 day h/o cough and chest congestion. Had xray done on 2/7 which showed no infiltrates but just vascular congestion.  + smoker, clear sputum in the AM and at night, some SOB. Very little swelling in legs.   Past Medical History  Diagnosis Date  . Hypertension   . Chronic kidney disease   . CHF (congestive heart failure)   . BPH (benign prostatic hyperplasia)   . Prostate cancer    Past Surgical History  Procedure Date  . Prostate biopsy    History   Social History  . Marital Status: Widowed    Spouse Name: N/A    Number of Children: N/A  . Years of Education: N/A   Social History Main Topics  . Smoking status: Current Everyday Smoker -- 60 years    Types: Cigarettes  . Smokeless tobacco: None   Comment: smokes 4-5 per day  . Alcohol Use: None  . Drug Use: None  . Sexually Active: None   Other Topics Concern  . None   Social History Narrative  . None   No family history on file. No Known Allergies Prior to Admission medications   Medication Sig Start Date End Date Taking? Authorizing Provider  amLODipine (NORVASC) 10 MG tablet Take 10 mg by mouth daily.   Yes Historical Provider, MD  aspirin 81 MG tablet Take 81 mg by mouth daily.   Yes Historical Provider, MD  calcitRIOL (ROCALTROL) 0.5 MCG capsule Take 0.5 mcg by mouth daily.   Yes Historical Provider, MD  calcium acetate, Phos Binder, (PHOSLYRA) 667 MG/5ML SOLN Take by mouth 3 (three) times daily with meals.   Yes Historical Provider, MD  carvedilol (COREG) 25 MG tablet Take 25 mg by mouth 2 (two) times daily with a meal.   Yes Historical Provider, MD  cloNIDine (CATAPRES) 0.2 MG tablet Take 0.2 mg by mouth 2 (two) times daily.   Yes Historical Provider, MD  furosemide (LASIX) 80 MG tablet Take 80 mg by mouth 2 (two) times daily.   Yes  Historical Provider, MD  niacin 250 MG tablet Take 250 mg by mouth 1 day or 1 dose.   Yes Historical Provider, MD  simvastatin (ZOCOR) 10 MG tablet Take 10 mg by mouth 1 day or 1 dose.   Yes Historical Provider, MD  sodium bicarbonate 650 MG tablet Take 650 mg by mouth 2 (two) times daily.   Yes Historical Provider, MD     ROS: The patient denies fevers, chills, night sweats, unintentional weight loss, chest pain, palpitations, wheezing, dyspnea on exertion, nausea, vomiting, abdominal pain, dysuria, hematuria, melena, numbness, weakness, or tingling. + cough, chest congestion  All other systems have been reviewed and were otherwise negative with the exception of those mentioned in the HPI and as above.    PHYSICAL EXAM: Filed Vitals:   02/28/11 1414  BP: 137/76  Pulse: 80  Temp: 98.1 F (36.7 C)  Resp: 16   Filed Vitals:   02/28/11 1414  Height: 5\' 7"  (1.702 m)  Weight: 156 lb (70.761 kg)   Body mass index is 24.43 kg/(m^2).  General: Alert, no acute distress HEENT:  Normocephalic, atraumatic, oropharynx patent. Cardiovascular:  Regular rate and rhythm, no rubs murmurs or gallops.  No Carotid bruits, radial pulse intact. No pedal edema.  Respiratory: Clear to auscultation bilaterally.  No wheezes, + rales, no rhonchi.  No cyanosis, no use of accessory musculature GI: No organomegaly, abdomen is soft and non-tender, positive bowel sounds.  No masses. Skin: No rashes. Neurologic: Facial musculature symmetric. Psychiatric: Patient is appropriate throughout our interaction. Lymphatic: No axillary or cervical lymphadenopathy Musculoskeletal: Gait intact.   LABS:   EKG/XRAY:   Primary read interpreted by Dr. Conley Rolls at [facility]. Xray looks unchanged from 02/18/11. He has increase vasc congestion, hilar LAD. I am suspicious of ? Right Lower lobe density but again it has been there since 02/18/11.   ASSESSMENT/PLAN: Encounter Diagnoses  Name Primary?  . Cough Yes  . Chest congestion     . Bronchitis due to tobacco use    1. Z-pack and Tussionex at night. Mucinex without D during day. Advise to monitor for CHF exacerbation. Consider tobacco cessation.     Daivon Rayos PHUONG, DO 03/02/2011 10:19 AM

## 2011-03-02 ENCOUNTER — Other Ambulatory Visit (HOSPITAL_COMMUNITY): Payer: Self-pay | Admitting: *Deleted

## 2011-03-04 ENCOUNTER — Encounter (HOSPITAL_COMMUNITY): Payer: BC Managed Care – PPO

## 2011-03-08 ENCOUNTER — Encounter (HOSPITAL_COMMUNITY): Payer: BC Managed Care – PPO

## 2011-03-09 ENCOUNTER — Encounter (HOSPITAL_COMMUNITY)
Admission: RE | Admit: 2011-03-09 | Discharge: 2011-03-09 | Disposition: A | Discharge status: Home / Self Care | Payer: Medicare Other | Source: Ambulatory Visit | Attending: Nephrology | Admitting: Nephrology

## 2011-03-09 MED ORDER — DARBEPOETIN ALFA-POLYSORBATE 500 MCG/ML IJ SOLN
100.0000 ug | INTRAMUSCULAR | Status: DC
  Filled 2011-03-09: qty 1

## 2011-03-09 MED ORDER — DARBEPOETIN ALFA-POLYSORBATE 100 MCG/0.5ML IJ SOLN
INTRAMUSCULAR | Status: AC
  Administered 2011-03-09: 100 ug via SUBCUTANEOUS
  Filled 2011-03-09: qty 0.5

## 2011-03-18 ENCOUNTER — Encounter (HOSPITAL_COMMUNITY)
Admission: RE | Admit: 2011-03-18 | Discharge: 2011-03-18 | Disposition: A | Discharge status: Home / Self Care | Payer: Medicare Other | Source: Ambulatory Visit | Attending: Nephrology | Admitting: Nephrology

## 2011-03-18 DIAGNOSIS — N184 Chronic kidney disease, stage 4 (severe): Secondary | ICD-10-CM | POA: Insufficient documentation

## 2011-03-18 DIAGNOSIS — D638 Anemia in other chronic diseases classified elsewhere: Secondary | ICD-10-CM | POA: Insufficient documentation

## 2011-03-18 MED ORDER — DARBEPOETIN ALFA-POLYSORBATE 500 MCG/ML IJ SOLN
100.0000 ug | INTRAMUSCULAR | Status: DC
  Filled 2011-03-18: qty 1

## 2011-03-18 MED ORDER — DARBEPOETIN ALFA-POLYSORBATE 100 MCG/0.5ML IJ SOLN
INTRAMUSCULAR | Status: AC
  Administered 2011-03-18: 100 ug via SUBCUTANEOUS
  Filled 2011-03-18: qty 0.5

## 2011-04-01 ENCOUNTER — Encounter (HOSPITAL_COMMUNITY)
Admission: RE | Admit: 2011-04-01 | Discharge: 2011-04-01 | Disposition: A | Discharge status: Home / Self Care | Payer: Medicare Other | Source: Ambulatory Visit | Attending: Nephrology | Admitting: Nephrology

## 2011-04-01 MED ORDER — DARBEPOETIN ALFA-POLYSORBATE 100 MCG/0.5ML IJ SOLN
INTRAMUSCULAR | Status: AC
  Administered 2011-04-01: 100 ug via SUBCUTANEOUS
  Filled 2011-04-01: qty 0.5

## 2011-04-01 MED ORDER — DARBEPOETIN ALFA-POLYSORBATE 500 MCG/ML IJ SOLN
100.0000 ug | INTRAMUSCULAR | Status: DC
  Filled 2011-04-01: qty 1

## 2011-04-12 ENCOUNTER — Encounter (HOSPITAL_COMMUNITY): Payer: Self-pay | Admitting: *Deleted

## 2011-04-12 ENCOUNTER — Emergency Department (HOSPITAL_COMMUNITY): Payer: Medicare Other

## 2011-04-12 ENCOUNTER — Inpatient Hospital Stay (HOSPITAL_COMMUNITY)
Admission: EM | Admit: 2011-04-12 | Discharge: 2011-04-19 | DRG: 682 | Disposition: A | Discharge status: Skilled Nursing Facility | Payer: Medicare Other | Attending: Internal Medicine | Admitting: Internal Medicine

## 2011-04-12 ENCOUNTER — Other Ambulatory Visit: Payer: Self-pay

## 2011-04-12 DIAGNOSIS — F17200 Nicotine dependence, unspecified, uncomplicated: Secondary | ICD-10-CM | POA: Diagnosis present

## 2011-04-12 DIAGNOSIS — I120 Hypertensive chronic kidney disease with stage 5 chronic kidney disease or end stage renal disease: Secondary | ICD-10-CM | POA: Diagnosis present

## 2011-04-12 DIAGNOSIS — I10 Essential (primary) hypertension: Secondary | ICD-10-CM | POA: Diagnosis present

## 2011-04-12 DIAGNOSIS — E785 Hyperlipidemia, unspecified: Secondary | ICD-10-CM | POA: Diagnosis present

## 2011-04-12 DIAGNOSIS — E872 Acidosis, unspecified: Secondary | ICD-10-CM | POA: Diagnosis present

## 2011-04-12 DIAGNOSIS — Z720 Tobacco use: Secondary | ICD-10-CM

## 2011-04-12 DIAGNOSIS — I509 Heart failure, unspecified: Secondary | ICD-10-CM | POA: Diagnosis present

## 2011-04-12 DIAGNOSIS — Z9181 History of falling: Secondary | ICD-10-CM

## 2011-04-12 DIAGNOSIS — N19 Unspecified kidney failure: Secondary | ICD-10-CM

## 2011-04-12 DIAGNOSIS — Z8546 Personal history of malignant neoplasm of prostate: Secondary | ICD-10-CM

## 2011-04-12 DIAGNOSIS — Z7982 Long term (current) use of aspirin: Secondary | ICD-10-CM

## 2011-04-12 DIAGNOSIS — N186 End stage renal disease: Secondary | ICD-10-CM | POA: Diagnosis present

## 2011-04-12 DIAGNOSIS — Z66 Do not resuscitate: Secondary | ICD-10-CM | POA: Diagnosis present

## 2011-04-12 DIAGNOSIS — R5381 Other malaise: Secondary | ICD-10-CM | POA: Diagnosis present

## 2011-04-12 DIAGNOSIS — Z79899 Other long term (current) drug therapy: Secondary | ICD-10-CM

## 2011-04-12 DIAGNOSIS — N179 Acute kidney failure, unspecified: Principal | ICD-10-CM | POA: Diagnosis present

## 2011-04-12 DIAGNOSIS — E8779 Other fluid overload: Secondary | ICD-10-CM | POA: Diagnosis present

## 2011-04-12 DIAGNOSIS — G9349 Other encephalopathy: Secondary | ICD-10-CM | POA: Diagnosis present

## 2011-04-12 DIAGNOSIS — I5022 Chronic systolic (congestive) heart failure: Secondary | ICD-10-CM | POA: Diagnosis present

## 2011-04-12 LAB — POCT I-STAT TROPONIN I: Troponin i, poc: 0.07 ng/mL (ref 0.00–0.08)

## 2011-04-12 LAB — CARDIAC PANEL(CRET KIN+CKTOT+MB+TROPI)
CK, MB: 6.9 ng/mL (ref 0.3–4.0)
Total CK: 146 U/L (ref 7–232)
Troponin I: <0.3 ng/mL (ref <0.30)

## 2011-04-12 LAB — POCT I-STAT 3, VENOUS BLOOD GAS (G3P V)
Acid-base deficit: 16 mmol/L — ABNORMAL HIGH (ref 0.0–2.0)
O2 Saturation: 63 %
TCO2: 12 mmol/L (ref 0–100)
pCO2, Ven: 30.9 mmHg — ABNORMAL LOW (ref 45.0–50.0)

## 2011-04-12 LAB — COMPREHENSIVE METABOLIC PANEL
ALT: 13 U/L (ref 0–53)
CO2: 9 mEq/L — CL (ref 19–32)
Calcium: 6.8 mg/dL — ABNORMAL LOW (ref 8.4–10.5)
Chloride: 113 mEq/L — ABNORMAL HIGH (ref 96–112)
Creatinine, Ser: 8.28 mg/dL — ABNORMAL HIGH (ref 0.50–1.35)
GFR calc Af Amer: 6 mL/min — ABNORMAL LOW (ref >90)
GFR calc non Af Amer: 5 mL/min — ABNORMAL LOW (ref >90)
Glucose, Bld: 104 mg/dL — ABNORMAL HIGH (ref 70–99)
Total Bilirubin: 0.3 mg/dL (ref 0.3–1.2)

## 2011-04-12 LAB — URINALYSIS, ROUTINE W REFLEX MICROSCOPIC
Bilirubin Urine: NEGATIVE
Nitrite: NEGATIVE
Specific Gravity, Urine: 1.015 (ref 1.005–1.030)
Urobilinogen, UA: 0.2 mg/dL (ref 0.0–1.0)
pH: 5.5 (ref 5.0–8.0)

## 2011-04-12 LAB — CBC
HCT: 41.8 % (ref 39.0–52.0)
Hemoglobin: 13.9 g/dL (ref 13.0–17.0)
MCV: 94.6 fL (ref 78.0–100.0)
RBC: 4.42 MIL/uL (ref 4.22–5.81)
WBC: 7.8 10*3/uL (ref 4.0–10.5)

## 2011-04-12 LAB — CK TOTAL AND CKMB (NOT AT ARMC)
CK, MB: 6.9 ng/mL (ref 0.3–4.0)
Relative Index: 4.6 — ABNORMAL HIGH (ref 0.0–2.5)

## 2011-04-12 LAB — DIFFERENTIAL
Eosinophils Relative: 0 % (ref 0–5)
Lymphocytes Relative: 5 % — ABNORMAL LOW (ref 12–46)
Lymphs Abs: 0.4 10*3/uL — ABNORMAL LOW (ref 0.7–4.0)
Monocytes Absolute: 0.7 10*3/uL (ref 0.1–1.0)
Neutro Abs: 6.6 10*3/uL (ref 1.7–7.7)

## 2011-04-12 LAB — URINE MICROSCOPIC-ADD ON

## 2011-04-12 MED ORDER — SODIUM BICARBONATE 8.4 % IV SOLN
50.0000 meq | Freq: Once | INTRAVENOUS | Status: AC
  Administered 2011-04-12: 50 meq via INTRAVENOUS
  Filled 2011-04-12: qty 50

## 2011-04-12 MED ORDER — CALCIUM GLUCONATE 10 % IV SOLN
INTRAVENOUS | Status: AC
  Filled 2011-04-12: qty 10

## 2011-04-12 MED ORDER — CALCIUM GLUCONATE 10 % IV SOLN
1.0000 g | Freq: Once | INTRAVENOUS | Status: AC
  Administered 2011-04-12: 1 g via INTRAVENOUS

## 2011-04-12 MED ORDER — SODIUM CHLORIDE 0.9 % IV SOLN
1.0000 g | INTRAVENOUS | Status: DC
  Filled 2011-04-12: qty 10

## 2011-04-12 MED ORDER — DEXTROSE 5 % IV SOLN
INTRAVENOUS | Status: AC
  Administered 2011-04-12: 23:00:00 via INTRAVENOUS
  Filled 2011-04-12: qty 150

## 2011-04-12 MED ORDER — SODIUM CHLORIDE 0.9 % IV SOLN
INTRAVENOUS | Status: AC
  Administered 2011-04-12: 100 mL
  Filled 2011-04-12: qty 100

## 2011-04-12 MED ORDER — CALCIUM GLUCONATE 10 % IV SOLN: 1.0000 g | Freq: Once | INTRAVENOUS | Status: DC

## 2011-04-12 NOTE — ED Provider Notes (Signed)
History     CSN: [id number]  Arrival date & time 04/12/11  1803   First MD Initiated Contact with Patient 04/12/11 1903      Chief Complaint  Patient presents with  . Altered Mental Status    (Consider location/radiation/quality/duration/timing/severity/associated sxs/prior treatment) HPI Comments: Patient presents from home with multiple falls and confusion has been worsening over the past course the past 2 weeks. He lives alone and has been refusing medications and not eating. He is a nurse that visits him every day for 5 hours and a sister that checks on him daily. He's had multiple falls as well as drainage and swelling of his bilateral legs. He is referred for dialysis in the past but refused. Patient denies any complaints. Denies any chest pain, shortness of breath, fever.  The history is provided by the patient, a relative and a caregiver.    Past Medical History  Diagnosis Date  . Hypertension   . Chronic kidney disease   . CHF (congestive heart failure)   . BPH (benign prostatic hyperplasia)   . Prostate cancer     Past Surgical History  Procedure Date  . Prostate biopsy     History reviewed. No pertinent family history.  History  Substance Use Topics  . Smoking status: Current Everyday Smoker -- 60 years    Types: Cigarettes  . Smokeless tobacco: Not on file   Comment: smokes 4-5 per day  . Alcohol Use: Not on file      Review of Systems  Constitutional: Positive for activity change, appetite change and fatigue. Negative for fever.  HENT: Negative for congestion and rhinorrhea.   Respiratory: Negative for cough, chest tightness and shortness of breath.   Cardiovascular: Negative for chest pain.  Gastrointestinal: Negative for nausea, vomiting and abdominal pain.  Genitourinary: Negative for dysuria and hematuria.  Musculoskeletal: Positive for gait problem. Negative for back pain.  Neurological: Positive for weakness and light-headedness.    Psychiatric/Behavioral: Positive for altered mental status.    Allergies  Review of patient's allergies indicates no known allergies.  Home Medications   Current Outpatient Rx  Name Route Sig Dispense Refill  . AMLODIPINE BESYLATE 10 MG PO TABS Oral Take 10 mg by mouth daily.    . ASPIRIN 81 MG PO TABS Oral Take 81 mg by mouth daily.    Marland Kitchen CALCIUM ACETATE (PHOS BINDER) 667 MG/5ML PO SOLN Oral Take by mouth 3 (three) times daily with meals.    Marland Kitchen CARVEDILOL 25 MG PO TABS Oral Take 25 mg by mouth 2 (two) times daily with a meal.    . CLONIDINE HCL 0.2 MG PO TABS Oral Take 0.2 mg by mouth 2 (two) times daily.    Marland Kitchen FERROUS SULFATE CR 160 (50 FE) MG PO TBCR Oral Take 160 mg by mouth daily.    . FUROSEMIDE 80 MG PO TABS Oral Take 160 mg by mouth 2 (two) times daily.     Marland Kitchen KRILL OIL 300 MG PO CAPS Oral Take 1 capsule by mouth daily.    Marland Kitchen NIACIN 250 MG PO TABS Oral Take 250 mg by mouth 1 day or 1 dose.    Marland Kitchen SIMVASTATIN 10 MG PO TABS Oral Take 10 mg by mouth 1 day or 1 dose.    . SODIUM BICARBONATE 650 MG PO TABS Oral Take 650 mg by mouth 2 (two) times daily.      BP 156/86  Pulse 90  Resp 16  SpO2 97%  Physical Exam  Constitutional: He is oriented to person, place, and time. He appears well-developed and well-nourished. No distress.  HENT:  Head: Normocephalic and atraumatic.  Mouth/Throat: Oropharynx is clear and moist. No oropharyngeal exudate.       Dry mucous membranes  Eyes: Conjunctivae are normal. Pupils are equal, round, and reactive to light.  Neck: Normal range of motion. Neck supple.  Cardiovascular: Normal rate, regular rhythm and normal heart sounds.   Pulmonary/Chest: Effort normal and breath sounds normal.       Basilar crackles  Abdominal: Soft. Bowel sounds are normal. There is no tenderness. There is no rebound and no guarding.  Musculoskeletal: Normal range of motion. He exhibits edema and tenderness.       Lower extremities with crack weeping skin bilaterally, +2  DP and PT pulses.  Neurological: He is alert and oriented to person, place, and time. No cranial nerve deficit.  Skin: Skin is warm.    ED Course  Procedures (including critical care time)  Labs Reviewed  CBC - Abnormal; Notable for the following:    RDW 16.8 (*)    All other components within normal limits  DIFFERENTIAL - Abnormal; Notable for the following:    Neutrophils Relative 85 (*)    Lymphocytes Relative 5 (*)    Lymphs Abs 0.4 (*)    All other components within normal limits  COMPREHENSIVE METABOLIC PANEL - Abnormal; Notable for the following:    Chloride 113 (*)    CO2 9 (*)    Glucose, Bld 104 (*)    BUN 95 (*)    Creatinine, Ser 8.28 (*)    Calcium 6.8 (*)    Albumin 2.6 (*)    GFR calc non Af Amer 5 (*)    GFR calc Af Amer 6 (*)    All other components within normal limits  CK TOTAL AND CKMB - Abnormal; Notable for the following:    CK, MB 6.9 (*)    Relative Index 4.6 (*)    All other components within normal limits  GLUCOSE, CAPILLARY - Abnormal; Notable for the following:    Glucose-Capillary 102 (*)    All other components within normal limits  URINALYSIS, ROUTINE W REFLEX MICROSCOPIC - Abnormal; Notable for the following:    APPearance CLOUDY (*)    Hgb urine dipstick TRACE (*)    Protein, ur 100 (*)    Leukocytes, UA TRACE (*)    All other components within normal limits  CARDIAC PANEL(CRET KIN+CKTOT+MB+TROPI) - Abnormal; Notable for the following:    CK, MB 6.9 (*) CRITICAL VALUE NOTED.  VALUE IS CONSISTENT WITH PREVIOUSLY REPORTED AND CALLED VALUE.   Relative Index 4.7 (*)    All other components within normal limits  PRO B NATRIURETIC PEPTIDE - Abnormal; Notable for the following:    Pro B Natriuretic peptide (BNP) >70000.0 (*)    All other components within normal limits  POCT I-STAT 3, BLOOD GAS (G3P V) - Abnormal; Notable for the following:    pH, Ven 7.176 (*)    pCO2, Ven 30.9 (*)    Bicarbonate 11.4 (*)    Acid-base deficit 16.0 (*)     All other components within normal limits  URINE MICROSCOPIC-ADD ON - Abnormal; Notable for the following:    Squamous Epithelial / LPF FEW (*)    Casts HYALINE CASTS (*)    All other components within normal limits  POCT I-STAT TROPONIN I  BLOOD GAS, VENOUS   Dg Chest 2 View  04/12/2011  *RADIOLOGY REPORT*  Clinical Data: Generalized weakness.  CHEST - 2 VIEW  Comparison: 02/28/2011  Findings: Chronic lung disease with COPD.  Progression of right lower lobe airspace disease since the prior study may represent pneumonia.  Mild left lower lobe airspace disease is most likely atelectasis.  Decreased lung volume compared with  the prior study. Small bilateral pleural effusions.  IMPRESSION: COPD.  Progression of right lower lobe airspace disease, possible pneumonia.  Small pleural effusions and mild left lower lobe atelectasis.  Original Report Authenticated By: Camelia Phenes, M.D.   Ct Head Wo Contrast  04/12/2011  *RADIOLOGY REPORT*  Clinical Data: Altered mental status.  CT HEAD WITHOUT CONTRAST  Technique:  Contiguous axial images were obtained from the base of the skull through the vertex without contrast.  Comparison: None  Findings: There is advanced atrophy and ventriculomegaly. Periventricular white matter disease is also noted.  No extra-axial fluid collections.  No CT findings for acute hemispheric infarction or intracranial hemorrhage.  The brainstem and cerebellum are grossly normal.  No acute skull fracture.  There is left-sided maxillary sinus disease which is likely chronic given the slight wall thickening and calcification.  Minimal mucoperiosteal thickening involving the right maxillary sinus.  The mastoid air cells and middle ear cavities are clear.  Vascular calcifications are noted.  Globes are intact.  IMPRESSION:  1.  Atrophy, ventriculomegaly and periventricular white matter disease. 2.  No acute intracranial findings or mass lesions.  Original Report Authenticated By: P. Loralie Champagne, M.D.     1. Metabolic acidosis   2. Renal failure       MDM  Failure to thrive at home with confusion, multiple falls, worsening renal failure.  Profound metabolic acidosis on laboratory studies. This is likely secondary to patient's progressively worsening renal failure. He has refused dialysis for several years. His baseline creatinines in the sixes today it is in his 8.3. With pH 7.1 bicarbonate 9 I was asked to discuss with critical care.  Critical care agrees with bicarbonate drip on this patient but feels that hospitalist can admit.  No hyperkalemia on labs.  Will need nephrology consult tomorrow but patient has continued to refuse dialysis.  CRITICAL CARE Performed by: Glynn Octave   Total critical care time: 30  Critical care time was exclusive of separately billable procedures and treating other patients.  Critical care was necessary to treat or prevent imminent or life-threatening deterioration.  Critical care was time spent personally by me on the following activities: development of treatment plan with patient and/or surrogate as well as nursing, discussions with consultants, evaluation of patient's response to treatment, examination of patient, obtaining history from patient or surrogate, ordering and performing treatments and interventions, ordering and review of laboratory studies, ordering and review of radiographic studies, pulse oximetry and re-evaluation of patient's condition.    Date: 04/12/2011  Rate: 84  Rhythm: normal sinus rhythm  QRS Axis: normal  Intervals: QT prolonged  ST/T Wave abnormalities: nonspecific ST/T changes and ST depressions laterally  Conduction Disutrbances:none  Narrative Interpretation: T wave inversions lateral leads  Old EKG Reviewed: none available       Glynn Octave, MD 04/13/11 1132

## 2011-04-12 NOTE — ED Notes (Signed)
Family at bedside; no signs of distress.  

## 2011-04-12 NOTE — ED Notes (Signed)
Patient transported to CT 

## 2011-04-12 NOTE — H&P (Signed)
Name: [name] MRN: [id number] DOB: 10-Jan-1926    LOS: 0  PCCM CONSULTATION NOTE  History of Present Illness:   Patient is 78 yo man with PMH of CKD (BL Cre 6.0, refused HD), HTN, who presents with weakness, worsening leg swelling and SOB. At his baseline, patient can walk around using cane. In recent 2 weeks, he started having generalized weakness, worsening bilateral leg swelling and SOB. Because of his weakness and severe leg swelling, he had two falls without injury to any parts of his body. He has mild intermittent cough which is also getting worse in the past 2 weeks. He coughs up little white clear sputum without blood in it.   Patient denies fever, chills, headaches, chest pain, diarrhea, constipation, dysuria, urgency, frequency, hematuria.  Lines / Drains: none  Cultures: none  Antibiotics: none   Tests / Events:  4/1: venous blood gas: pH 7.176; pCO2 30.9; pO2 40; HCO3 11.4; TCO2 12, O2Sat 63. 4/1: troponin less than 0.30, CK-MB 6.9;  4/1: Pro BNP: 70000 4/1: CBC: WBC 7.8; Hb 13.9; platelet161 4/1: UA: negative for nitrite, trace leukocyte; rare bacteria; negative for ketone  CT-head: No acute intracranial findings or mass lesions. CXR: COPD. Progression of right lower lobe airspace disease. Small pleural effusions and mild left lower lobe atelectasis  Past Medical History  Diagnosis Date  . Hypertension   . Chronic kidney disease   . CHF (congestive heart failure)   . BPH (benign prostatic hyperplasia)   . Prostate cancer    Past Surgical History  Procedure Date  . Prostate biopsy    Prior to Admission medications   Medication Sig Start Date End Date Taking? Authorizing Provider  amLODipine (NORVASC) 10 MG tablet Take 10 mg by mouth daily.   Yes Historical Provider, MD  aspirin 81 MG tablet Take 81 mg by mouth daily.   Yes Historical Provider, MD  calcium acetate, Phos Binder, (PHOSLYRA) 667 MG/5ML SOLN Take by mouth 3 (three) times daily with meals.    Yes Historical Provider, MD  carvedilol (COREG) 25 MG tablet Take 25 mg by mouth 2 (two) times daily with a meal.   Yes Historical Provider, MD  cloNIDine (CATAPRES) 0.2 MG tablet Take 0.2 mg by mouth 2 (two) times daily.   Yes Historical Provider, MD  ferrous sulfate dried (SLOW FE) 160 (50 FE) MG TBCR Take 160 mg by mouth daily.   Yes Historical Provider, MD  furosemide (LASIX) 80 MG tablet Take 160 mg by mouth 2 (two) times daily.    Yes Historical Provider, MD  Boris Lown Oil 300 MG CAPS Take 1 capsule by mouth daily.   Yes Historical Provider, MD  niacin 250 MG tablet Take 250 mg by mouth 1 day or 1 dose.   Yes Historical Provider, MD  simvastatin (ZOCOR) 10 MG tablet Take 10 mg by mouth 1 day or 1 dose.   Yes Historical Provider, MD  sodium bicarbonate 650 MG tablet Take 650 mg by mouth 2 (two) times daily.   Yes Historical Provider, MD   Allergies: No Known Allergies  Family History No family history on file.  Social History  reports that he has been smoking Cigarettes.  He has smoked for the past 60 years. He does not have any smokeless tobacco history on file. His alcohol and drug histories not on file.  Review Of Systems: as per HPI   Vital Signs: Pulse Rate:  [81-85] 81  (04/01 1957) Resp:  [12-20] 16  (04/01 2130) BP: (  123-149)/(69-79) 149/78 mmHg (04/01 2130) SpO2:  [96 %-98 %] 96 % (04/01 1957)   Physical Examination:  General: resting in bed, not in acute distress HEENT: PERRL, EOMI, no scleral icterus Cardiac: S1/S2, RRR, No murmurs, gallops or rubs Pulm: decreased air movement at base bilaterally, fine crakes at base bilaterally. No wheezing, rhonchi or rubs. Abd: Soft,  nondistended, nontender, no rebound pain, no organomegaly, BS present Ext:  3+ pitting edema in legs and feet;  1+DP/PT pulse bilaterally Neuro: alert and oriented X3, cranial nerves II-XII grossly intact, muscle strength 5/5 in all extremeties,  sensation to light touch intact.   Ventilator settings:  none   Labs and Imaging:  Reviewed.  Please refer to the Assessment and Plan section for relevant results.  ASSESSMENT AND PLAN  NEUROLOGIC A:  No neurologic issue P:  PULMONARY  Lab 04/12/11 2008  PHART --  PCO2ART --  PO2ART --  HCO3 11.4*  O2SAT 63.0   A:  Pulmonary edema due to renal failure and possible CHF.  P:  diuresis  CARDIOVASCULAR  Lab 04/12/11 1925  TROPONINI <0.30  LATICACIDVEN --  PROBNP >70000.0*   A:  Possible exacerbation of CHF. His Pro BNP is 70000. P:  diuresis  RENAL  Lab 04/12/11 1842  NA 143  K 4.2  CL 113*  CO2 9*  BUN 95*  CREATININE 8.28*  CALCIUM 6.8*  MG --  PHOS --   A:  Acidosis due to renal failure. Patient refused HD. P: IV bicarbonate   GASTROINTESTINAL  Lab 04/12/11 1842  AST 11  ALT 13  ALKPHOS 84  BILITOT 0.3  PROT 6.6  ALBUMIN 2.6*   A:  No GI issue P:   HEMATOLOGIC  Lab 04/12/11 1842  HGB 13.9  HCT 41.8  PLT 161  INR --  APTT --   A:  No issue P:  No interventions required  INFECTIOUS  Lab 04/12/11 1842  WBC 7.8  PROCALCITON --   A:  No issue. Patient is afebrile, no leukocytosis. Although UA showed trace leukocyte and rare bacteria, patient is asymptomatic for UTI. Will not treat for UTI. P:   ENDOCRINE  Lab 04/12/11 1834  GLUCAP 102*   A:  No issue P:  No interventions required   BEST PRACTICE / DISPOSITION -->  Admit under [facility].  No indications for ICU admission. -->  Code status: Patient is DNR. DNR form was signed.  -->  Diet: renal diet -->  Family updated at bedside  Resident: Dr. Eben Burow, Ankit Intern: Dr. Lorretta Harp  Patient examined.  Records reviewed.  Assessment and plan discussed with resident team as above.  End stage renal disease with evidence of fluid overload / element of CHF and metabolic acidosis, without hyperkalemia.  Malnutrition. Refusing HD.  DNI / DNR.  Would replace bicarbonate.  Consider palliative care consult. No indications for ICU admission.  PCCM  will sign off.  Attending Physician: Orlean Bradford, M.D., F.C.C.P. Pulmonary and Critical Care Medicine [facility] Cell: [phone] Pager: [phone]  04/12/2011, 10:55 PM

## 2011-04-12 NOTE — ED Notes (Signed)
PT presents with dry, scaly skin that is flaking. Left arm cannot be used due to dialysis access.

## 2011-04-12 NOTE — ED Notes (Signed)
The pts temp will not register orally x4.  Needs a rectal temp

## 2011-04-12 NOTE — ED Notes (Signed)
Pt to xray

## 2011-04-12 NOTE — ED Notes (Signed)
Admitting MD at bedside.

## 2011-04-12 NOTE — ED Notes (Signed)
The pt has been confused and having multiple falls for 2 weeks.  He has renal failure and he refuses dialysis.  He lives alone.  He has also had drainage from both legs for the past 2-3 days

## 2011-04-12 NOTE — H&P (Addendum)
[name] is an 78 y.o. male. PCP - Dr.Pandey.([facility]).   Chief Complaint: Weakness with lower extremity edema. HPI: 78 year old male with known history of CK D. stage 4-5 with creatinine usually around 6, hypertension, CHF, BPH, hyperlipidemia he was brought in the ER because patient was feeling increasingly weak with increasing swelling of the lower extremities and frequent falls. In addition the patient was noticed to confused and patient's home health aide who usually takes care of him brought him to the ER. In the ER patient was found to have a creatinine of 8 with a bicarbonate of 9. Patient has always refused dialysis. Patient presently is alert awake oriented and still refuses dialysis and patient's daughter was at bedside also agrees that patient has always refused dialysis and also does not want to be resuscitated thus a DO NOT RESUSCITATE. Patient denies any headache, cough, phlegm, fever chills, chest pain, shortness of breath, nausea vomiting or pain or diarrhea.  Past Medical History  Diagnosis Date  . Hypertension   . Chronic kidney disease   . CHF (congestive heart failure)   . BPH (benign prostatic hyperplasia)   . Prostate cancer     Past Surgical History  Procedure Date  . Prostate biopsy     History reviewed. No pertinent family history. Social History:  reports that he has been smoking Cigarettes.  He has smoked for the past 60 years. He does not have any smokeless tobacco history on file. His alcohol and drug histories not on file.  Allergies: No Known Allergies  Medications Prior to Admission  Medication Dose Route Frequency Provider Last Rate Last Dose  . calcium gluconate injection - for URGENT use only!  1 g Intravenous Once Glynn Octave, MD   1 g at 04/12/11 2116  . sodium bicarbonate 150 mEq in dextrose 5 % 1,000 mL infusion   Intravenous To Major Glynn Octave, MD 50 mL/hr at 04/12/11 2230    . sodium bicarbonate injection 50 mEq  50 mEq Intravenous Once  Glynn Octave, MD   50 mEq at 04/12/11 2058  . sodium chloride 0.9 % infusion      100 mL/hr at 04/12/11 2120 100 mL at 04/12/11 2120  . DISCONTD: calcium gluconate 1 g in sodium chloride 0.9 % 100 mL IVPB  1 g Intravenous To Major Glynn Octave, MD      . DISCONTD: calcium gluconate injection - for URGENT use only!  1 g Intravenous Once Glynn Octave, MD       Medications Prior to Admission  Medication Sig Dispense Refill  . amLODipine (NORVASC) 10 MG tablet Take 10 mg by mouth daily.      Marland Kitchen aspirin 81 MG tablet Take 81 mg by mouth daily.      . calcium acetate, Phos Binder, (PHOSLYRA) 667 MG/5ML SOLN Take by mouth 3 (three) times daily with meals.      . carvedilol (COREG) 25 MG tablet Take 25 mg by mouth 2 (two) times daily with a meal.      . cloNIDine (CATAPRES) 0.2 MG tablet Take 0.2 mg by mouth 2 (two) times daily.      . ferrous sulfate dried (SLOW FE) 160 (50 FE) MG TBCR Take 160 mg by mouth daily.      . furosemide (LASIX) 80 MG tablet Take 160 mg by mouth 2 (two) times daily.       . niacin 250 MG tablet Take 250 mg by mouth 1 day or 1 dose.      Marland Kitchen  simvastatin (ZOCOR) 10 MG tablet Take 10 mg by mouth 1 day or 1 dose.      . sodium bicarbonate 650 MG tablet Take 650 mg by mouth 2 (two) times daily.        Results for orders placed during the hospital encounter of 04/12/11 (from the past 48 hour(s))  GLUCOSE, CAPILLARY     Status: Abnormal   Collection Time   04/12/11  6:34 PM      Component Value Range Comment   Glucose-Capillary 102 (*) 70 - 99 (mg/dL)    Comment 1 Documented in Chart     CBC     Status: Abnormal   Collection Time   04/12/11  6:42 PM      Component Value Range Comment   WBC 7.8  4.0 - 10.5 (K/uL)    RBC 4.42  4.22 - 5.81 (MIL/uL)    Hemoglobin 13.9  13.0 - 17.0 (g/dL)    HCT 57.8  46.9 - 62.9 (%)    MCV 94.6  78.0 - 100.0 (fL)    MCH 31.4  26.0 - 34.0 (pg)    MCHC 33.3  30.0 - 36.0 (g/dL)    RDW 52.8 (*) 41.3 - 15.5 (%)    Platelets 161  150 - 400  (K/uL)   DIFFERENTIAL     Status: Abnormal   Collection Time   04/12/11  6:42 PM      Component Value Range Comment   Neutrophils Relative 85 (*) 43 - 77 (%)    Neutro Abs 6.6  1.7 - 7.7 (K/uL)    Lymphocytes Relative 5 (*) 12 - 46 (%)    Lymphs Abs 0.4 (*) 0.7 - 4.0 (K/uL)    Monocytes Relative 9  3 - 12 (%)    Monocytes Absolute 0.7  0.1 - 1.0 (K/uL)    Eosinophils Relative 0  0 - 5 (%)    Eosinophils Absolute 0.0  0.0 - 0.7 (K/uL)    Basophils Relative 0  0 - 1 (%)    Basophils Absolute 0.0  0.0 - 0.1 (K/uL)   COMPREHENSIVE METABOLIC PANEL     Status: Abnormal   Collection Time   04/12/11  6:42 PM      Component Value Range Comment   Sodium 143  135 - 145 (mEq/L)    Potassium 4.2  3.5 - 5.1 (mEq/L)    Chloride 113 (*) 96 - 112 (mEq/L)    CO2 9 (*) 19 - 32 (mEq/L)    Glucose, Bld 104 (*) 70 - 99 (mg/dL)    BUN 95 (*) 6 - 23 (mg/dL)    Creatinine, Ser 2.44 (*) 0.50 - 1.35 (mg/dL)    Calcium 6.8 (*) 8.4 - 10.5 (mg/dL)    Total Protein 6.6  6.0 - 8.3 (g/dL)    Albumin 2.6 (*) 3.5 - 5.2 (g/dL)    AST 11  0 - 37 (U/L)    ALT 13  0 - 53 (U/L)    Alkaline Phosphatase 84  39 - 117 (U/L)    Total Bilirubin 0.3  0.3 - 1.2 (mg/dL)    GFR calc non Af Amer 5 (*) >90 (mL/min)    GFR calc Af Amer 6 (*) >90 (mL/min)   CK TOTAL AND CKMB     Status: Abnormal   Collection Time   04/12/11  6:49 PM      Component Value Range Comment   Total CK 151  7 - 232 (U/L)  CK, MB 6.9 (*) 0.3 - 4.0 (ng/mL)    Relative Index 4.6 (*) 0.0 - 2.5    POCT I-STAT TROPONIN I     Status: Normal   Collection Time   04/12/11  7:01 PM      Component Value Range Comment   Troponin i, poc 0.07  0.00 - 0.08 (ng/mL)    Comment 3            CARDIAC PANEL(CRET KIN+CKTOT+MB+TROPI)     Status: Abnormal   Collection Time   04/12/11  7:25 PM      Component Value Range Comment   Total CK 146  7 - 232 (U/L)    CK, MB 6.9 (*) 0.3 - 4.0 (ng/mL) CRITICAL VALUE NOTED.  VALUE IS CONSISTENT WITH PREVIOUSLY REPORTED AND CALLED  VALUE.   Troponin I <0.30  <0.30 (ng/mL)    Relative Index 4.7 (*) 0.0 - 2.5    PRO B NATRIURETIC PEPTIDE     Status: Abnormal   Collection Time   04/12/11  7:25 PM      Component Value Range Comment   Pro B Natriuretic peptide (BNP) >70000.0 (*) 0 - 450 (pg/mL)   POCT I-STAT 3, BLOOD GAS (G3P V)     Status: Abnormal   Collection Time   04/12/11  8:08 PM      Component Value Range Comment   pH, Ven 7.176 (*) 7.250 - 7.300     pCO2, Ven 30.9 (*) 45.0 - 50.0 (mmHg)    pO2, Ven 40.0  30.0 - 45.0 (mmHg)    Bicarbonate 11.4 (*) 20.0 - 24.0 (mEq/L)    TCO2 12  0 - 100 (mmol/L)    O2 Saturation 63.0      Acid-base deficit 16.0 (*) 0.0 - 2.0 (mmol/L)    Sample type VENOUS      Comment NOTIFIED PHYSICIAN     URINALYSIS, ROUTINE W REFLEX MICROSCOPIC     Status: Abnormal   Collection Time   04/12/11  9:08 PM      Component Value Range Comment   Color, Urine YELLOW  YELLOW     APPearance CLOUDY (*) CLEAR     Specific Gravity, Urine 1.015  1.005 - 1.030     pH 5.5  5.0 - 8.0     Glucose, UA NEGATIVE  NEGATIVE (mg/dL)    Hgb urine dipstick TRACE (*) NEGATIVE     Bilirubin Urine NEGATIVE  NEGATIVE     Ketones, ur NEGATIVE  NEGATIVE (mg/dL)    Protein, ur 161 (*) NEGATIVE (mg/dL)    Urobilinogen, UA 0.2  0.0 - 1.0 (mg/dL)    Nitrite NEGATIVE  NEGATIVE     Leukocytes, UA TRACE (*) NEGATIVE    URINE MICROSCOPIC-ADD ON     Status: Abnormal   Collection Time   04/12/11  9:08 PM      Component Value Range Comment   Squamous Epithelial / LPF FEW (*) RARE     WBC, UA 3-6  <3 (WBC/hpf)    RBC / HPF 3-6  <3 (RBC/hpf)    Bacteria, UA RARE  RARE     Casts HYALINE CASTS (*) NEGATIVE     Urine-Other MUCOUS PRESENT      Dg Chest 2 View  04/12/2011  *RADIOLOGY REPORT*  Clinical Data: Generalized weakness.  CHEST - 2 VIEW  Comparison: 02/28/2011  Findings: Chronic lung disease with COPD.  Progression of right lower lobe airspace disease since the prior study may represent pneumonia.  Mild left lower lobe  airspace disease is most likely atelectasis.  Decreased lung volume compared with  the prior study. Small bilateral pleural effusions.  IMPRESSION: COPD.  Progression of right lower lobe airspace disease, possible pneumonia.  Small pleural effusions and mild left lower lobe atelectasis.  Original Report Authenticated By: Camelia Phenes, M.D.   Ct Head Wo Contrast  04/12/2011  *RADIOLOGY REPORT*  Clinical Data: Altered mental status.  CT HEAD WITHOUT CONTRAST  Technique:  Contiguous axial images were obtained from the base of the skull through the vertex without contrast.  Comparison: None  Findings: There is advanced atrophy and ventriculomegaly. Periventricular white matter disease is also noted.  No extra-axial fluid collections.  No CT findings for acute hemispheric infarction or intracranial hemorrhage.  The brainstem and cerebellum are grossly normal.  No acute skull fracture.  There is left-sided maxillary sinus disease which is likely chronic given the slight wall thickening and calcification.  Minimal mucoperiosteal thickening involving the right maxillary sinus.  The mastoid air cells and middle ear cavities are clear.  Vascular calcifications are noted.  Globes are intact.  IMPRESSION:  1.  Atrophy, ventriculomegaly and periventricular white matter disease. 2.  No acute intracranial findings or mass lesions.  Original Report Authenticated By: P. Loralie Champagne, M.D.    Review of Systems  HENT: Negative.   Eyes: Negative.   Respiratory: Negative.   Cardiovascular: Negative.   Gastrointestinal: Negative.   Genitourinary: Negative.   Musculoskeletal: Positive for falls.       Lower extremity edema.  Skin: Negative.   Neurological: Positive for weakness.  Endo/Heme/Allergies: Negative.   Psychiatric/Behavioral: Negative.     Blood pressure 156/86, pulse 90, resp. rate 16, SpO2 97.00%. Physical Exam  Constitutional: He is oriented to person, place, and time. He appears well-developed and  well-nourished. No distress.  HENT:  Head: Normocephalic and atraumatic.  Right Ear: External ear normal.  Left Ear: External ear normal.  Mouth/Throat: No oropharyngeal exudate.  Eyes: Conjunctivae are normal. Pupils are equal, round, and reactive to light. Right eye exhibits no discharge. Left eye exhibits no discharge. No scleral icterus.  Neck: Normal range of motion. Neck supple.  Cardiovascular: Normal rate and regular rhythm.   Respiratory: Effort normal and breath sounds normal. No respiratory distress. He has no wheezes. He has no rales.  GI: Soft. Bowel sounds are normal. He exhibits no distension. There is no tenderness. There is no rebound.  Musculoskeletal: Normal range of motion. He exhibits edema.       3 plus edema both lower extremities.  Neurological: He is alert and oriented to person, place, and time.       Moves all extremities.  Skin: Skin is warm and dry. He is not diaphoretic.  Psychiatric: His behavior is normal.     Assessment/Plan #1. Acute renal failure on chronic kidney disease with severe metabolic acidosis - patient has refused dialysis, which I have also confirmed with patient's daughter who is at bedside. I did discuss with on-call nephrologist Dr. Allena Katz. We will be continuing with Lasix 160 mg IV q. 8 with sodium bicarbonate tablets 1300 mg 3 times daily. Patient also has received IV bicarbonate in the ER. Patient does have hypocalcemia probably from renal failure. At this time we will place patient on Foley catheter and   closely follow intake output.check renal sonogram .Recheck metabolic panel in a.m. Patient has history of systolic heart failure which could also be contributing to it. At present the patient  denies any chest pain. 2-D echo done in July 2011 showed EF of 35- 40%. #2. Possible pneumonia - I have placed patient empirically on Levaquin per pharmacy protocol. #3. History of hypertension - continue present medications. #4. History of  hyperlipidemia. #5. History of prostate cancer.  CODE STATUS - DO NOT RESUSCITATE. Which was confirmed with patient's daughter. Also patient declines dialysis.  Chrishana Spargur N. 04/12/2011, 11:15 PM

## 2011-04-12 NOTE — ED Notes (Signed)
Family at bedside. 

## 2011-04-13 ENCOUNTER — Inpatient Hospital Stay (HOSPITAL_COMMUNITY): Payer: Medicare Other

## 2011-04-13 DIAGNOSIS — E440 Moderate protein-calorie malnutrition: Secondary | ICD-10-CM

## 2011-04-13 DIAGNOSIS — M7989 Other specified soft tissue disorders: Secondary | ICD-10-CM

## 2011-04-13 DIAGNOSIS — I509 Heart failure, unspecified: Secondary | ICD-10-CM

## 2011-04-13 DIAGNOSIS — E782 Mixed hyperlipidemia: Secondary | ICD-10-CM

## 2011-04-13 DIAGNOSIS — N186 End stage renal disease: Secondary | ICD-10-CM

## 2011-04-13 LAB — CARDIAC PANEL(CRET KIN+CKTOT+MB+TROPI)
CK, MB: 5.8 ng/mL — ABNORMAL HIGH (ref 0.3–4.0)
CK, MB: 5.3 ng/mL — ABNORMAL HIGH (ref 0.3–4.0)
Relative Index: 5.4 — ABNORMAL HIGH (ref 0.0–2.5)
Relative Index: 5.1 — ABNORMAL HIGH (ref 0.0–2.5)
Total CK: 115 U/L (ref 7–232)
Total CK: 107 U/L (ref 7–232)
Total CK: 104 U/L (ref 7–232)

## 2011-04-13 LAB — CBC
HCT: 34.1 % — ABNORMAL LOW (ref 39.0–52.0)
Hemoglobin: 11.4 g/dL — ABNORMAL LOW (ref 13.0–17.0)
MCH: 30.7 pg (ref 26.0–34.0)
MCHC: 33.7 g/dL (ref 30.0–36.0)
MCHC: 33.5 g/dL (ref 30.0–36.0)
MCV: 91.9 fL (ref 78.0–100.0)
Platelets: 152 10*3/uL (ref 150–400)
RDW: 16.1 % — ABNORMAL HIGH (ref 11.5–15.5)
RDW: 16.2 % — ABNORMAL HIGH (ref 11.5–15.5)

## 2011-04-13 LAB — COMPREHENSIVE METABOLIC PANEL
ALT: 12 U/L (ref 0–53)
AST: 16 U/L (ref 0–37)
Alkaline Phosphatase: 76 U/L (ref 39–117)
CO2: 14 mEq/L — ABNORMAL LOW (ref 19–32)
Chloride: 113 mEq/L — ABNORMAL HIGH (ref 96–112)
GFR calc non Af Amer: 5 mL/min — ABNORMAL LOW (ref >90)
Glucose, Bld: 115 mg/dL — ABNORMAL HIGH (ref 70–99)
Potassium: 4 mEq/L (ref 3.5–5.1)
Sodium: 146 mEq/L — ABNORMAL HIGH (ref 135–145)
Total Bilirubin: 0.2 mg/dL — ABNORMAL LOW (ref 0.3–1.2)

## 2011-04-13 LAB — URINE MICROSCOPIC-ADD ON

## 2011-04-13 LAB — URINALYSIS, ROUTINE W REFLEX MICROSCOPIC
Glucose, UA: NEGATIVE mg/dL
Ketones, ur: NEGATIVE mg/dL
Protein, ur: 30 mg/dL — AB

## 2011-04-13 LAB — CREATININE, SERUM
Creatinine, Ser: 7.28 mg/dL — ABNORMAL HIGH (ref 0.50–1.35)
GFR calc non Af Amer: 6 mL/min — ABNORMAL LOW (ref >90)

## 2011-04-13 LAB — PHOSPHORUS: Phosphorus: 8.7 mg/dL — ABNORMAL HIGH (ref 2.3–4.6)

## 2011-04-13 LAB — HEPATITIS B SURFACE ANTIBODY,QUALITATIVE: Hep B S Ab: NEGATIVE

## 2011-04-13 LAB — HEPATITIS B SURFACE ANTIGEN: Hepatitis B Surface Ag: NEGATIVE

## 2011-04-13 MED ORDER — ACETAMINOPHEN 650 MG RE SUPP: 650.0000 mg | Freq: Four times a day (QID) | RECTAL | Status: DC | PRN

## 2011-04-13 MED ORDER — HYDRALAZINE HCL 10 MG PO TABS
10.0000 mg | ORAL_TABLET | Freq: Three times a day (TID) | ORAL | Status: DC
  Administered 2011-04-13 – 2011-04-15 (×5): 10 mg via ORAL
  Filled 2011-04-13 (×9): qty 1

## 2011-04-13 MED ORDER — AMLODIPINE BESYLATE 10 MG PO TABS
10.0000 mg | ORAL_TABLET | Freq: Every day | ORAL | Status: DC
  Administered 2011-04-13: 10 mg via ORAL
  Filled 2011-04-13: qty 1

## 2011-04-13 MED ORDER — ASPIRIN 81 MG PO CHEW
81.0000 mg | CHEWABLE_TABLET | Freq: Every day | ORAL | Status: DC
  Administered 2011-04-13 – 2011-04-19 (×7): 81 mg via ORAL
  Filled 2011-04-13 (×8): qty 1

## 2011-04-13 MED ORDER — FUROSEMIDE 10 MG/ML IJ SOLN
160.0000 mg | Freq: Three times a day (TID) | INTRAVENOUS | Status: DC
  Administered 2011-04-13 (×3): 160 mg via INTRAVENOUS
  Filled 2011-04-13 (×5): qty 16

## 2011-04-13 MED ORDER — SODIUM CHLORIDE 0.9 % IJ SOLN
3.0000 mL | Freq: Two times a day (BID) | INTRAMUSCULAR | Status: DC
  Administered 2011-04-13 – 2011-04-14 (×4): 3 mL via INTRAVENOUS

## 2011-04-13 MED ORDER — ACETAMINOPHEN 325 MG PO TABS: 650.0000 mg | ORAL_TABLET | Freq: Four times a day (QID) | ORAL | Status: DC | PRN

## 2011-04-13 MED ORDER — FERROUS SULFATE CR 160 (50 FE) MG PO TBCR
160.0000 mg | EXTENDED_RELEASE_TABLET | Freq: Every day | ORAL | Status: DC
  Administered 2011-04-13: 160 mg via ORAL
  Filled 2011-04-13: qty 1

## 2011-04-13 MED ORDER — ONDANSETRON HCL 4 MG PO TABS: 4.0000 mg | ORAL_TABLET | Freq: Four times a day (QID) | ORAL | Status: DC | PRN

## 2011-04-13 MED ORDER — CALCIUM ACETATE (PHOS BINDER) 667 MG/5ML PO SOLN
667.0000 mg | Freq: Three times a day (TID) | ORAL | Status: DC
  Administered 2011-04-13 – 2011-04-19 (×18): 667 mg via ORAL
  Filled 2011-04-13 (×32): qty 5

## 2011-04-13 MED ORDER — CLONIDINE HCL 0.2 MG PO TABS
0.2000 mg | ORAL_TABLET | Freq: Two times a day (BID) | ORAL | Status: DC
  Administered 2011-04-13 (×2): 0.2 mg via ORAL
  Filled 2011-04-13 (×3): qty 1

## 2011-04-13 MED ORDER — LEVOFLOXACIN IN D5W 500 MG/100ML IV SOLN
500.0000 mg | Freq: Once | INTRAVENOUS | Status: AC
  Administered 2011-04-13: 500 mg via INTRAVENOUS
  Filled 2011-04-13: qty 100

## 2011-04-13 MED ORDER — HEPARIN SODIUM (PORCINE) 5000 UNIT/ML IJ SOLN
5000.0000 [IU] | Freq: Three times a day (TID) | INTRAMUSCULAR | Status: DC
  Administered 2011-04-13 – 2011-04-19 (×21): 5000 [IU] via SUBCUTANEOUS
  Filled 2011-04-13 (×23): qty 1

## 2011-04-13 MED ORDER — SIMVASTATIN 10 MG PO TABS
10.0000 mg | ORAL_TABLET | ORAL | Status: DC
  Administered 2011-04-13 – 2011-04-18 (×7): 10 mg via ORAL
  Filled 2011-04-13 (×8): qty 1

## 2011-04-13 MED ORDER — NEPRO/CARBSTEADY PO LIQD
237.0000 mL | Freq: Every day | ORAL | Status: DC
  Administered 2011-04-14 – 2011-04-19 (×6): 237 mL via ORAL

## 2011-04-13 MED ORDER — LEVOFLOXACIN IN D5W 500 MG/100ML IV SOLN
500.0000 mg | INTRAVENOUS | Status: DC
  Administered 2011-04-14: 500 mg via INTRAVENOUS
  Filled 2011-04-13 (×2): qty 100

## 2011-04-13 MED ORDER — CARVEDILOL 25 MG PO TABS
25.0000 mg | ORAL_TABLET | Freq: Two times a day (BID) | ORAL | Status: DC
  Administered 2011-04-13 – 2011-04-18 (×9): 25 mg via ORAL
  Filled 2011-04-13 (×13): qty 1

## 2011-04-13 MED ORDER — SODIUM CHLORIDE 0.9 % IJ SOLN
3.0000 mL | Freq: Two times a day (BID) | INTRAMUSCULAR | Status: DC
  Administered 2011-04-13 – 2011-04-18 (×11): 3 mL via INTRAVENOUS

## 2011-04-13 MED ORDER — ONDANSETRON HCL 4 MG/2ML IJ SOLN: 4.0000 mg | Freq: Four times a day (QID) | INTRAMUSCULAR | Status: DC | PRN

## 2011-04-13 MED ORDER — ISOSORBIDE MONONITRATE ER 30 MG PO TB24
30.0000 mg | ORAL_TABLET | Freq: Every day | ORAL | Status: DC
  Administered 2011-04-14 – 2011-04-19 (×6): 30 mg via ORAL
  Filled 2011-04-13 (×7): qty 1

## 2011-04-13 MED ORDER — RENA-VITE PO TABS
1.0000 | ORAL_TABLET | Freq: Every day | ORAL | Status: DC
  Administered 2011-04-14 – 2011-04-19 (×6): 1 via ORAL
  Filled 2011-04-13 (×7): qty 1

## 2011-04-13 MED ORDER — NIACIN 250 MG PO TABS
250.0000 mg | ORAL_TABLET | ORAL | Status: DC
  Administered 2011-04-13 – 2011-04-18 (×7): 250 mg via ORAL
  Filled 2011-04-13 (×8): qty 1

## 2011-04-13 MED ORDER — SODIUM BICARBONATE 650 MG PO TABS
1300.0000 mg | ORAL_TABLET | Freq: Three times a day (TID) | ORAL | Status: DC
  Administered 2011-04-13: 1300 mg via ORAL
  Filled 2011-04-13 (×3): qty 2

## 2011-04-13 MED ORDER — LEVOFLOXACIN IN D5W 250 MG/50ML IV SOLN: 250.0000 mg | INTRAVENOUS | Status: DC

## 2011-04-13 NOTE — Progress Notes (Signed)
VASCULAR LAB PRELIMINARY  PRELIMINARY  PRELIMINARY  PRELIMINARY  Bilateral lower extremity venous duplex completed.    Preliminary report:  Bilateral:  No evidence of DVT, superficial thrombosis, or Baker's Cyst.   Shamiracle Gorden D, RVS 04/13/2011, 3:17 PM

## 2011-04-13 NOTE — Progress Notes (Signed)
Hemodialysis note: Patient receiving his first hemodialysis treatment.  First cannulation with difficulty. Hemodynamically stable . [name]

## 2011-04-13 NOTE — Progress Notes (Signed)
INITIAL ADULT NUTRITION ASSESSMENT Date: 04/13/2011   Time: 10:31 AM Reason for Assessment: nutrition risk  ASSESSMENT: Male 78 y.o.  Dx: Renal failure (ARF), acute on chronic  Hx:  Past Medical History  Diagnosis Date  . Hypertension   . Chronic kidney disease   . CHF (congestive heart failure)   . BPH (benign prostatic hyperplasia)   . Prostate cancer    Past Surgical History  Procedure Date  . Prostate biopsy     Related Meds:  Scheduled Meds:   . amLODipine  10 mg Oral Daily  . aspirin  81 mg Oral Daily  . calcium acetate (Phos Binder)  667 mg Oral TID WC  . calcium gluconate  1 g Intravenous Once  . carvedilol  25 mg Oral BID WC  . cloNIDine  0.2 mg Oral BID  . ferrous sulfate dried  160 mg Oral Daily  . furosemide  160 mg Intravenous Q8H  . heparin  5,000 Units Subcutaneous Q8H  . levofloxacin (LEVAQUIN) IV  500 mg Intravenous Once  . levofloxacin (LEVAQUIN) IV  500 mg Intravenous Q48H  . niacin  250 mg Oral 1 day or 1 dose  . simvastatin  10 mg Oral 1 day or 1 dose  .  sodium bicarbonate infusion 1000 mL   Intravenous To Major  . sodium bicarbonate  50 mEq Intravenous Once  . sodium bicarbonate  1,300 mg Oral TID  . sodium chloride      . sodium chloride  3 mL Intravenous Q12H  . sodium chloride  3 mL Intravenous Q12H  . DISCONTD: calcium gluconate  1 g Intravenous To Major  . DISCONTD: calcium gluconate  1 g Intravenous Once  . DISCONTD: levofloxacin (LEVAQUIN) IV  250 mg Intravenous Q48H   Continuous Infusions:  PRN Meds:.acetaminophen, acetaminophen, ondansetron (ZOFRAN) IV, ondansetron   Ht: 6' (182.9 cm)  Wt: 145 lb 1 oz (65.8 kg)  Ideal Wt: 80.2 kg % Ideal Wt: 81.9 kg  Usual Wt: unknown % Usual Wt:   Body mass index is 19.67 kg/(m^2).  Food/Nutrition Related Hx: H/o CKD, pt refusing HD, DNR Home diet reported as Renal, Mechanical soft  Labs:  CMP     Component Value Date/Time   NA 143 04/12/2011 1842   K 4.2 04/12/2011 1842   CL 113*  04/12/2011 1842   CO2 9* 04/12/2011 1842   GLUCOSE 104* 04/12/2011 1842   BUN 95* 04/12/2011 1842   CREATININE 7.28* 04/13/2011 0007   CALCIUM 6.8* 04/12/2011 1842   CALCIUM 7.5* 11/30/2006 1740   PROT 6.6 04/12/2011 1842   ALBUMIN 2.6* 04/12/2011 1842   AST 11 04/12/2011 1842   ALT 13 04/12/2011 1842   ALKPHOS 84 04/12/2011 1842   BILITOT 0.3 04/12/2011 1842   GFRNONAA 6* 04/13/2011 0007   GFRAA 7* 04/13/2011 0007    CBC    Component Value Date/Time   WBC 8.5 04/13/2011 0200   RBC 3.71* 04/13/2011 0200   HGB 11.5* 04/13/2011 0200   HCT 34.1* 04/13/2011 0200   PLT 151 04/13/2011 0200   MCV 91.9 04/13/2011 0200   MCH 31.0 04/13/2011 0200   MCHC 33.7 04/13/2011 0200   RDW 16.1* 04/13/2011 0200   LYMPHSABS 0.4* 04/12/2011 1842   MONOABS 0.7 04/12/2011 1842   EOSABS 0.0 04/12/2011 1842   BASOSABS 0.0 04/12/2011 1842    Intake:  Output:   Intake/Output Summary (Last 24 hours) at 04/13/11 1033 Last data filed at 04/13/11 0000  Gross per 24 hour  Intake     60 ml  Output    250 ml  Net   -190 ml   +UOP, no stools since admission   Diet Order: Heart Healthy  Supplements/Tube Feeding: none at this time  IVF:    Estimated Nutritional Needs:   Kcal: 2105-2300 kcal Protein: 68-80g Fluid: per MD discretion  Pt admitted with weakness, shortness of breath, leg swelling (3+ edema).  Pt with CKD, Cr elevated compared to baseline.  Pt is able to walk with a cane, however s/p falls without injury PTA.  Pt sleeping soundly at time of visit, does not fully awaken to voice.  Pt with wasting in face, clavicles, and sternum.  Wt hx unknown.  PO intake minimal. Noted MD recommendation for Palliative care consult.  RD to monitor for GOC and plan of care.  Pt qualifies for severe malnutrition given degree of wasting, suboptimal wt/BMI for age and build (which is likely falsely elevated due to swelling), reported decreased intake, and change in strength/functional status as evidences by weakness with falls PTA.  NUTRITION  DIAGNOSIS: -Inadequate oral intake (NI-2.1).  Status: Ongoing  RELATED TO: weakness  AS EVIDENCE BY: BMI 19.6 suboptimal for age and build  MONITORING/EVALUATION(Goals): 1.  Food/Beverage; pt to consume foods as desired 2.  GOC; RD to monitor for nutrition-related plan and wishes  EDUCATION NEEDS: -Education not appropriate at this time  INTERVENTION: 1.  Supplements; Nepro once daily 2.  General healthful diet; encourage intake as able.  Pt reportedly consumes a Renal, mechanical soft diet at home.  RD will downgrade current Health Healthy diet to Mechanical soft.  Renal diet per MD discretion and GOC.  Intake since admission has been minimal.  Dietitian #: [phone]  DOCUMENTATION CODES Per approved criteria  -Severe malnutrition in the context of chronic illness    Hoyt Koch 04/13/2011, 10:31 AM

## 2011-04-13 NOTE — Progress Notes (Signed)
ANTIBIOTIC CONSULT NOTE - INITIAL  Pharmacy Consult for levofloxacin Indication: PNA  No Known Allergies  Patient Measurements: Height: 6' (182.9 cm) Weight: 145 lb 1 oz (65.8 kg) IBW/kg (Calculated) : 77.6  Adjusted Body Weight:   Vital Signs: Temp: 97.3 F (36.3 C) (04/02 0700) Temp src: Oral (04/02 0700) BP: 142/74 mmHg (04/02 0700) Pulse Rate: 79  (04/02 0700) Intake/Output from previous day: 04/01 0701 - 04/02 0700 In: 60 [P.O.:60] Out: 250 [Urine:250] Intake/Output from this shift:    Labs:  Basename 04/13/11 0200 04/13/11 0007 04/12/11 1842  WBC 8.5 -- 7.8  HGB 11.5* -- 13.9  PLT 151 -- 161  LABCREA -- -- --  CREATININE -- 7.28* 8.28*   Estimated Creatinine Clearance: 7.2 ml/min (by C-G formula based on Cr of 7.28). No results found for this basename: VANCOTROUGH:2,VANCOPEAK:2,VANCORANDOM:2,GENTTROUGH:2,GENTPEAK:2,GENTRANDOM:2,TOBRATROUGH:2,TOBRAPEAK:2,TOBRARND:2,AMIKACINPEAK:2,AMIKACINTROU:2,AMIKACIN:2, in the last 72 hours   Microbiology: Recent Results (from the past 720 hour(s))  MRSA PCR SCREENING     Status: Abnormal   Collection Time   04/13/11  2:58 AM      Component Value Range Status Comment   MRSA by PCR POSITIVE (*) NEGATIVE  Final     Medical History: Past Medical History  Diagnosis Date  . Hypertension   . Chronic kidney disease   . CHF (congestive heart failure)   . BPH (benign prostatic hyperplasia)   . Prostate cancer     Medications:  Prescriptions prior to admission  Medication Sig Dispense Refill  . amLODipine (NORVASC) 10 MG tablet Take 10 mg by mouth daily.      Marland Kitchen aspirin 81 MG tablet Take 81 mg by mouth daily.      . calcium acetate, Phos Binder, (PHOSLYRA) 667 MG/5ML SOLN Take by mouth 3 (three) times daily with meals.      . carvedilol (COREG) 25 MG tablet Take 25 mg by mouth 2 (two) times daily with a meal.      . cloNIDine (CATAPRES) 0.2 MG tablet Take 0.2 mg by mouth 2 (two) times daily.      . ferrous sulfate dried  (SLOW FE) 160 (50 FE) MG TBCR Take 160 mg by mouth daily.      . furosemide (LASIX) 80 MG tablet Take 160 mg by mouth 2 (two) times daily.       Boris Lown Oil 300 MG CAPS Take 1 capsule by mouth daily.      . niacin 250 MG tablet Take 250 mg by mouth 1 day or 1 dose.      . simvastatin (ZOCOR) 10 MG tablet Take 10 mg by mouth 1 day or 1 dose.      . sodium bicarbonate 650 MG tablet Take 650 mg by mouth 2 (two) times daily.       Assessment: 78 yo M with possible PNA and ESRD with no plans for HD.  Goal of Therapy:  Appropriate renal dosing.  Plan:  Change levaquin to 500mg  q48 No further dose changes needed so RX will sign off.

## 2011-04-13 NOTE — Progress Notes (Signed)
ANTIBIOTIC CONSULT NOTE - INITIAL  Pharmacy Consult for levofloxacin Indication: r/o UTI  No Known Allergies  Patient Measurements: Height: 6' (182.9 cm) Weight: 145 lb 1 oz (65.8 kg) IBW/kg (Calculated) : 77.6  Adjusted Body Weight:   Vital Signs: Temp: 98 F (36.7 C) (04/01 2352) Temp src: Oral (04/01 2352) BP: 155/70 mmHg (04/01 2352) Pulse Rate: 91  (04/01 2352) Intake/Output from previous day:   Intake/Output from this shift:    Labs:  [facility] 04/12/11 1842  WBC 7.8  HGB 13.9  PLT 161  LABCREA --  CREATININE 8.28*   Estimated Creatinine Clearance: 6.3 ml/min (by C-G formula based on Cr of 8.28). No results found for this basename: VANCOTROUGH:2,VANCOPEAK:2,VANCORANDOM:2,GENTTROUGH:2,GENTPEAK:2,GENTRANDOM:2,TOBRATROUGH:2,TOBRAPEAK:2,TOBRARND:2,AMIKACINPEAK:2,AMIKACINTROU:2,AMIKACIN:2, in the last 72 hours   Microbiology: No results found for this or any previous visit (from the past 720 hour(s)).  Medical History: Past Medical History  Diagnosis Date  . Hypertension   . Chronic kidney disease   . CHF (congestive heart failure)   . BPH (benign prostatic hyperplasia)   . Prostate cancer     Medications:  Prescriptions prior to admission  Medication Sig Dispense Refill  . amLODipine (NORVASC) 10 MG tablet Take 10 mg by mouth daily.      Marland Kitchen aspirin 81 MG tablet Take 81 mg by mouth daily.      . calcium acetate, Phos Binder, (PHOSLYRA) 667 MG/5ML SOLN Take by mouth 3 (three) times daily with meals.      . carvedilol (COREG) 25 MG tablet Take 25 mg by mouth 2 (two) times daily with a meal.      . cloNIDine (CATAPRES) 0.2 MG tablet Take 0.2 mg by mouth 2 (two) times daily.      . ferrous sulfate dried (SLOW FE) 160 (50 FE) MG TBCR Take 160 mg by mouth daily.      . furosemide (LASIX) 80 MG tablet Take 160 mg by mouth 2 (two) times daily.       Boris Lown Oil 300 MG CAPS Take 1 capsule by mouth daily.      . niacin 250 MG tablet Take 250 mg by mouth 1 day or 1  dose.      . simvastatin (ZOCOR) 10 MG tablet Take 10 mg by mouth 1 day or 1 dose.      . sodium bicarbonate 650 MG tablet Take 650 mg by mouth 2 (two) times daily.       Assessment: 78 yo M with possible UTI and ESRD with no plans for HD.  Goal of Therapy:  Appropriate renal dosing.  Plan:  Levofloxacin 500 mg IV x1 now then 250 mg IV q48h.  Shon Mansouri L. Illene Bolus, PharmD, BCPS Clinical Pharmacist Pager: [phone] 04/13/2011 12:16 AM

## 2011-04-13 NOTE — Progress Notes (Signed)
[facility]   This is a 78 y/o male who presents for weakness, falls, confusion and pedal edema.  He has CKD and follows with Dr Allena Katz as outpt. He is now found to have worsening of his renal function. He has a graft in LUE. The patient states that he does not want dialysis. This was confirmed by his daughter last night as well when he was being admitted. After discussion with Dr Allena Katz last night, he has been started on Lasix to help him diurese and PO Bicarb to treat the acidosis.   He is oriented to place and situation and is able to give some history. States graft was put in a while back in preparation for dialysis. States the pedal edema has been going on for a few weeks and admits to a mild congested cough.   A/P   ARF on CRF Cont with IV Lasix and PO Bicarb. Monitor I and O. Nephro consult requested- spoke with Dr Lowell Guitar.   Pedal edema Likely related to progressive renal failure F/u on 2 D ECHO- has systolic chf (see below)  lasix as above.   Confusion/Delirium  Possibly uremia.  However, CXR consistant with RLL infiltrate and therefore he was placed on Levaquin.  Will check a UA as well to r/o UTi as a source of his confusion.   RLL infiltrate PNA? Atelectasis? asymmetric edema?  ON Levaquin. C/o mild congested cough.   HTN BP stable on Norvasc, Coreg and Clonidine  Chronic systolic CHF EF 35-40% Lasix Should be on Nitro and Hydralzine combo to decrease Preload Will d/c Clonidine and Norvasc and start Imdur and Hydralazine.   Calvert Cantor, MD [phone]

## 2011-04-13 NOTE — Consult Note (Signed)
HPI:78 year old male with known history of CK D. stage-5 followed by Dr. Briant Cedar s/p AVF in past  And hx  of hypertension, CHF, BPH, hyperlipidemia he was brought in the ER because patient was feeling increasingly weak with increasing swelling of the lower extremities and frequent falls. In addition the patient was noticed to confused and patient's home health aide who usually takes care of him brought him to the ER. In the ER patient was found to have a creatinine of 8 with a bicarbonate of 9. Patient has refused dialysis in the past an upon admission.  After discussion with me and Dr. Briant Cedar he has agreed to treatment with IHD.   Past Medical History  Diagnosis Date  . Hypertension   . Chronic kidney disease   . CHF (congestive heart failure)   . BPH (benign prostatic hyperplasia)   . Prostate cancer    Past Surgical History  Procedure Date  . Prostate biopsy    Social History:  reports that he has been smoking Cigarettes.  He has smoked for the past 60 years. He does not have any smokeless tobacco history on file. His alcohol and drug histories not on file. Allergies: No Known Allergies History reviewed. No pertinent family history.  Medications:  Scheduled:   . aspirin  81 mg Oral Daily  . calcium acetate (Phos Binder)  667 mg Oral TID WC  . calcium gluconate  1 g Intravenous Once  . carvedilol  25 mg Oral BID WC  . feeding supplement (NEPRO CARB STEADY)  237 mL Oral Q1400  . heparin  5,000 Units Subcutaneous Q8H  . hydrALAZINE  10 mg Oral Q8H  . isosorbide mononitrate  30 mg Oral Daily  . levofloxacin (LEVAQUIN) IV  500 mg Intravenous Once  . levofloxacin (LEVAQUIN) IV  500 mg Intravenous Q48H  . multivitamin  1 tablet Oral Daily  . niacin  250 mg Oral 1 day or 1 dose  . simvastatin  10 mg Oral 1 day or 1 dose  .  sodium bicarbonate infusion 1000 mL   Intravenous To Major  . sodium bicarbonate  50 mEq Intravenous Once  . sodium chloride      . sodium chloride  3 mL  Intravenous Q12H  . sodium chloride  3 mL Intravenous Q12H  . DISCONTD: amLODipine  10 mg Oral Daily  . DISCONTD: calcium gluconate  1 g Intravenous To Major  . DISCONTD: calcium gluconate  1 g Intravenous Once  . DISCONTD: cloNIDine  0.2 mg Oral BID  . DISCONTD: ferrous sulfate dried  160 mg Oral Daily  . DISCONTD: furosemide  160 mg Intravenous Q8H  . DISCONTD: levofloxacin (LEVAQUIN) IV  250 mg Intravenous Q48H  . DISCONTD: sodium bicarbonate  1,300 mg Oral TID     ROS: generalized malaise Blood pressure 122/64, pulse 71, temperature 97.8 F (36.6 C), temperature source Oral, resp. rate 13, height 6' (1.829 m), weight 65.8 kg (145 lb 1 oz), SpO2 94.00%.  General appearance: alert and cooperative Head: Normocephalic, without obvious abnormality, atraumatic Eyes: conjunctivae/corneas clear. PERRL, EOM's intact. Fundi benign. Ears: normal TM's and external ear canals both ears Nose: Nares normal. Septum midline. Mucosa normal. No drainage or sinus tenderness. Throat: lips, mucosa, and tongue normal; teeth and gums normal Resp: clear to auscultation bilaterally Chest wall: no tenderness Cardio: regular rate and rhythm, S1, S2 normal, no murmur, click, rub or gallop GI: soft, non-tender; bowel sounds normal; no masses,  no organomegaly Extremities: venous stasis dermatitis noted  and 2-3+ edema with chronic changes, well developed AVF left arm Skin: Skin color, texture, turgor normal. No rashes or lesions Neurologic: Grossly normal with slowed mentation Results for orders placed during the hospital encounter of 04/12/11 (from the past 48 hour(s))  GLUCOSE, CAPILLARY     Status: Abnormal   Collection Time   04/12/11  6:34 PM      Component Value Range Comment   Glucose-Capillary 102 (*) 70 - 99 (mg/dL)    Comment 1 Documented in Chart     CBC     Status: Abnormal   Collection Time   04/12/11  6:42 PM      Component Value Range Comment   WBC 7.8  4.0 - 10.5 (K/uL)    RBC 4.42  4.22 -  5.81 (MIL/uL)    Hemoglobin 13.9  13.0 - 17.0 (g/dL)    HCT 14.7  82.9 - 56.2 (%)    MCV 94.6  78.0 - 100.0 (fL)    MCH 31.4  26.0 - 34.0 (pg)    MCHC 33.3  30.0 - 36.0 (g/dL)    RDW 13.0 (*) 86.5 - 15.5 (%)    Platelets 161  150 - 400 (K/uL)   DIFFERENTIAL     Status: Abnormal   Collection Time   04/12/11  6:42 PM      Component Value Range Comment   Neutrophils Relative 85 (*) 43 - 77 (%)    Neutro Abs 6.6  1.7 - 7.7 (K/uL)    Lymphocytes Relative 5 (*) 12 - 46 (%)    Lymphs Abs 0.4 (*) 0.7 - 4.0 (K/uL)    Monocytes Relative 9  3 - 12 (%)    Monocytes Absolute 0.7  0.1 - 1.0 (K/uL)    Eosinophils Relative 0  0 - 5 (%)    Eosinophils Absolute 0.0  0.0 - 0.7 (K/uL)    Basophils Relative 0  0 - 1 (%)    Basophils Absolute 0.0  0.0 - 0.1 (K/uL)   COMPREHENSIVE METABOLIC PANEL     Status: Abnormal   Collection Time   04/12/11  6:42 PM      Component Value Range Comment   Sodium 143  135 - 145 (mEq/L)    Potassium 4.2  3.5 - 5.1 (mEq/L)    Chloride 113 (*) 96 - 112 (mEq/L)    CO2 9 (*) 19 - 32 (mEq/L)    Glucose, Bld 104 (*) 70 - 99 (mg/dL)    BUN 95 (*) 6 - 23 (mg/dL)    Creatinine, Ser 7.84 (*) 0.50 - 1.35 (mg/dL)    Calcium 6.8 (*) 8.4 - 10.5 (mg/dL)    Total Protein 6.6  6.0 - 8.3 (g/dL)    Albumin 2.6 (*) 3.5 - 5.2 (g/dL)    AST 11  0 - 37 (U/L)    ALT 13  0 - 53 (U/L)    Alkaline Phosphatase 84  39 - 117 (U/L)    Total Bilirubin 0.3  0.3 - 1.2 (mg/dL)    GFR calc non Af Amer 5 (*) >90 (mL/min)    GFR calc Af Amer 6 (*) >90 (mL/min)   CK TOTAL AND CKMB     Status: Abnormal   Collection Time   04/12/11  6:49 PM      Component Value Range Comment   Total CK 151  7 - 232 (U/L)    CK, MB 6.9 (*) 0.3 - 4.0 (ng/mL)    Relative Index 4.6 (*)  0.0 - 2.5    POCT I-STAT TROPONIN I     Status: Normal   Collection Time   04/12/11  7:01 PM      Component Value Range Comment   Troponin i, poc 0.07  0.00 - 0.08 (ng/mL)    Comment 3            CARDIAC PANEL(CRET KIN+CKTOT+MB+TROPI)      Status: Abnormal   Collection Time   04/12/11  7:25 PM      Component Value Range Comment   Total CK 146  7 - 232 (U/L)    CK, MB 6.9 (*) 0.3 - 4.0 (ng/mL) CRITICAL VALUE NOTED.  VALUE IS CONSISTENT WITH PREVIOUSLY REPORTED AND CALLED VALUE.   Troponin I <0.30  <0.30 (ng/mL)    Relative Index 4.7 (*) 0.0 - 2.5    PRO B NATRIURETIC PEPTIDE     Status: Abnormal   Collection Time   04/12/11  7:25 PM      Component Value Range Comment   Pro B Natriuretic peptide (BNP) >70000.0 (*) 0 - 450 (pg/mL)   POCT I-STAT 3, BLOOD GAS (G3P V)     Status: Abnormal   Collection Time   04/12/11  8:08 PM      Component Value Range Comment   pH, Ven 7.176 (*) 7.250 - 7.300     pCO2, Ven 30.9 (*) 45.0 - 50.0 (mmHg)    pO2, Ven 40.0  30.0 - 45.0 (mmHg)    Bicarbonate 11.4 (*) 20.0 - 24.0 (mEq/L)    TCO2 12  0 - 100 (mmol/L)    O2 Saturation 63.0      Acid-base deficit 16.0 (*) 0.0 - 2.0 (mmol/L)    Sample type VENOUS      Comment NOTIFIED PHYSICIAN     URINALYSIS, ROUTINE W REFLEX MICROSCOPIC     Status: Abnormal   Collection Time   04/12/11  9:08 PM      Component Value Range Comment   Color, Urine YELLOW  YELLOW     APPearance CLOUDY (*) CLEAR     Specific Gravity, Urine 1.015  1.005 - 1.030     pH 5.5  5.0 - 8.0     Glucose, UA NEGATIVE  NEGATIVE (mg/dL)    Hgb urine dipstick TRACE (*) NEGATIVE     Bilirubin Urine NEGATIVE  NEGATIVE     Ketones, ur NEGATIVE  NEGATIVE (mg/dL)    Protein, ur 161 (*) NEGATIVE (mg/dL)    Urobilinogen, UA 0.2  0.0 - 1.0 (mg/dL)    Nitrite NEGATIVE  NEGATIVE     Leukocytes, UA TRACE (*) NEGATIVE    URINE MICROSCOPIC-ADD ON     Status: Abnormal   Collection Time   04/12/11  9:08 PM      Component Value Range Comment   Squamous Epithelial / LPF FEW (*) RARE     WBC, UA 3-6  <3 (WBC/hpf)    RBC / HPF 3-6  <3 (RBC/hpf)    Bacteria, UA RARE  RARE     Casts HYALINE CASTS (*) NEGATIVE     Urine-Other MUCOUS PRESENT     CREATININE, SERUM     Status: Abnormal   Collection  Time   04/13/11 12:07 AM      Component Value Range Comment   Creatinine, Ser 7.28 (*) 0.50 - 1.35 (mg/dL)    GFR calc non Af Amer 6 (*) >90 (mL/min)    GFR calc Af Amer 7 (*) >90 (mL/min)  CARDIAC PANEL(CRET KIN+CKTOT+MB+TROPI)     Status: Abnormal   Collection Time   04/13/11 12:09 AM      Component Value Range Comment   Total CK 115  7 - 232 (U/L)    CK, MB 5.6 (*) 0.3 - 4.0 (ng/mL)    Troponin I <0.30  <0.30 (ng/mL)    Relative Index 4.9 (*) 0.0 - 2.5    CBC     Status: Abnormal   Collection Time   04/13/11  2:00 AM      Component Value Range Comment   WBC 8.5  4.0 - 10.5 (K/uL)    RBC 3.71 (*) 4.22 - 5.81 (MIL/uL)    Hemoglobin 11.5 (*) 13.0 - 17.0 (g/dL)    HCT 16.1 (*) 09.6 - 52.0 (%)    MCV 91.9  78.0 - 100.0 (fL)    MCH 31.0  26.0 - 34.0 (pg)    MCHC 33.7  30.0 - 36.0 (g/dL)    RDW 04.5 (*) 40.9 - 15.5 (%)    Platelets 151  150 - 400 (K/uL)   MRSA PCR SCREENING     Status: Abnormal   Collection Time   04/13/11  2:58 AM      Component Value Range Comment   MRSA by PCR POSITIVE (*) NEGATIVE    COMPREHENSIVE METABOLIC PANEL     Status: Abnormal   Collection Time   04/13/11  1:07 PM      Component Value Range Comment   Sodium 146 (*) 135 - 145 (mEq/L)    Potassium 4.0  3.5 - 5.1 (mEq/L)    Chloride 113 (*) 96 - 112 (mEq/L)    CO2 14 (*) 19 - 32 (mEq/L)    Glucose, Bld 115 (*) 70 - 99 (mg/dL)    BUN 95 (*) 6 - 23 (mg/dL)    Creatinine, Ser 8.11 (*) 0.50 - 1.35 (mg/dL)    Calcium 6.5 (*) 8.4 - 10.5 (mg/dL)    Total Protein 5.3 (*) 6.0 - 8.3 (g/dL)    Albumin 2.0 (*) 3.5 - 5.2 (g/dL)    AST 16  0 - 37 (U/L)    ALT 12  0 - 53 (U/L)    Alkaline Phosphatase 76  39 - 117 (U/L)    Total Bilirubin 0.2 (*) 0.3 - 1.2 (mg/dL)    GFR calc non Af Amer 5 (*) >90 (mL/min)    GFR calc Af Amer 6 (*) >90 (mL/min)   CBC     Status: Abnormal   Collection Time   04/13/11  1:07 PM      Component Value Range Comment   WBC 7.5  4.0 - 10.5 (K/uL)    RBC 3.71 (*) 4.22 - 5.81 (MIL/uL)     Hemoglobin 11.4 (*) 13.0 - 17.0 (g/dL)    HCT 91.4 (*) 78.2 - 52.0 (%)    MCV 91.6  78.0 - 100.0 (fL)    MCH 30.7  26.0 - 34.0 (pg)    MCHC 33.5  30.0 - 36.0 (g/dL)    RDW 95.6 (*) 21.3 - 15.5 (%)    Platelets 152  150 - 400 (K/uL)   PHOSPHORUS     Status: Abnormal   Collection Time   04/13/11  1:07 PM      Component Value Range Comment   Phosphorus 8.7 (*) 2.3 - 4.6 (mg/dL)   MAGNESIUM     Status: Normal   Collection Time   04/13/11  1:07 PM  Component Value Range Comment   Magnesium 2.2  1.5 - 2.5 (mg/dL)   CARDIAC PANEL(CRET KIN+CKTOT+MB+TROPI)     Status: Abnormal   Collection Time   04/13/11  1:08 PM      Component Value Range Comment   Total CK 107  7 - 232 (U/L)    CK, MB 5.8 (*) 0.3 - 4.0 (ng/mL)    Troponin I <0.30  <0.30 (ng/mL)    Relative Index 5.4 (*) 0.0 - 2.5    URINALYSIS, ROUTINE W REFLEX MICROSCOPIC     Status: Abnormal   Collection Time   04/13/11  3:52 PM      Component Value Range Comment   Color, Urine YELLOW  YELLOW     APPearance CLEAR  CLEAR     Specific Gravity, Urine 1.010  1.005 - 1.030     pH 5.5  5.0 - 8.0     Glucose, UA NEGATIVE  NEGATIVE (mg/dL)    Hgb urine dipstick MODERATE (*) NEGATIVE     Bilirubin Urine NEGATIVE  NEGATIVE     Ketones, ur NEGATIVE  NEGATIVE (mg/dL)    Protein, ur 30 (*) NEGATIVE (mg/dL)    Urobilinogen, UA 0.2  0.0 - 1.0 (mg/dL)    Nitrite NEGATIVE  NEGATIVE     Leukocytes, UA MODERATE (*) NEGATIVE    URINE MICROSCOPIC-ADD ON     Status: Abnormal   Collection Time   04/13/11  3:52 PM      Component Value Range Comment   Squamous Epithelial / LPF RARE  RARE     WBC, UA 7-10  <3 (WBC/hpf)    RBC / HPF 11-20  <3 (RBC/hpf)    Bacteria, UA RARE  RARE     Casts GRANULAR CAST (*) NEGATIVE  HYALINE CASTS   Dg Chest 2 View  04/12/2011  *RADIOLOGY REPORT*  Clinical Data: Generalized weakness.  CHEST - 2 VIEW  Comparison: 02/28/2011  Findings: Chronic lung disease with COPD.  Progression of right lower lobe airspace disease since  the prior study may represent pneumonia.  Mild left lower lobe airspace disease is most likely atelectasis.  Decreased lung volume compared with  the prior study. Small bilateral pleural effusions.  IMPRESSION: COPD.  Progression of right lower lobe airspace disease, possible pneumonia.  Small pleural effusions and mild left lower lobe atelectasis.  Original Report Authenticated By: Camelia Phenes, M.D.   Ct Head Wo Contrast  04/12/2011  *RADIOLOGY REPORT*  Clinical Data: Altered mental status.  CT HEAD WITHOUT CONTRAST  Technique:  Contiguous axial images were obtained from the base of the skull through the vertex without contrast.  Comparison: None  Findings: There is advanced atrophy and ventriculomegaly. Periventricular white matter disease is also noted.  No extra-axial fluid collections.  No CT findings for acute hemispheric infarction or intracranial hemorrhage.  The brainstem and cerebellum are grossly normal.  No acute skull fracture.  There is left-sided maxillary sinus disease which is likely chronic given the slight wall thickening and calcification.  Minimal mucoperiosteal thickening involving the right maxillary sinus.  The mastoid air cells and middle ear cavities are clear.  Vascular calcifications are noted.  Globes are intact.  IMPRESSION:  1.  Atrophy, ventriculomegaly and periventricular white matter disease. 2.  No acute intracranial findings or mass lesions.  Original Report Authenticated By: P. Loralie Champagne, M.D.    Assessment:  1 Uremic Syndrome (acidosis, volume overload, mental status changes) 2  Volume Overload Plan: 1 Begin hemodialyssi via AV  access 2 "CLIP" patient for outpatient dialysis  3 Consider short term placement or rehab                                                                                                                                                                     Makeda Peeks C 04/13/2011, 4:37 PM

## 2011-04-14 ENCOUNTER — Inpatient Hospital Stay (HOSPITAL_COMMUNITY): Payer: Medicare Other

## 2011-04-14 LAB — URINE CULTURE
Colony Count: NO GROWTH
Culture  Setup Time: 201304020458
Culture  Setup Time: 201304021620
Culture: NO GROWTH

## 2011-04-14 LAB — RENAL FUNCTION PANEL
Albumin: 2.1 g/dL — ABNORMAL LOW (ref 3.5–5.2)
Calcium: 7.1 mg/dL — ABNORMAL LOW (ref 8.4–10.5)
GFR calc Af Amer: 8 mL/min — ABNORMAL LOW (ref >90)
Glucose, Bld: 74 mg/dL (ref 70–99)
Phosphorus: 6.2 mg/dL — ABNORMAL HIGH (ref 2.3–4.6)
Potassium: 3.3 mEq/L — ABNORMAL LOW (ref 3.5–5.1)
Sodium: 144 mEq/L (ref 135–145)

## 2011-04-14 LAB — HEPATITIS B CORE ANTIBODY, TOTAL: Hep B Core Total Ab: NEGATIVE

## 2011-04-14 MED ORDER — CHLORHEXIDINE GLUCONATE CLOTH 2 % EX PADS
6.0000 | MEDICATED_PAD | Freq: Every day | CUTANEOUS | Status: DC
  Administered 2011-04-14: 6 via TOPICAL

## 2011-04-14 MED ORDER — THERA-DERM EX LOTN
TOPICAL_LOTION | CUTANEOUS | Status: DC | PRN
  Filled 2011-04-14: qty 236

## 2011-04-14 MED ORDER — LANOLIN HYDROUS EX OINT
TOPICAL_OINTMENT | Freq: Two times a day (BID) | CUTANEOUS | Status: DC | PRN
  Filled 2011-04-14: qty 28

## 2011-04-14 MED ORDER — MUPIROCIN 2 % EX OINT
1.0000 "application " | TOPICAL_OINTMENT | Freq: Two times a day (BID) | CUTANEOUS | Status: AC
  Administered 2011-04-14 – 2011-04-18 (×10): 1 via NASAL
  Filled 2011-04-14: qty 22

## 2011-04-14 MED ORDER — HEPARIN SODIUM (PORCINE) 1000 UNIT/ML IJ SOLN
1300.0000 [IU] | Freq: Once | INTRAMUSCULAR | Status: AC
  Administered 2011-04-14: 1300 [IU] via INTRAVENOUS
  Filled 2011-04-14: qty 1.3

## 2011-04-14 NOTE — Progress Notes (Addendum)
[facility] [place] TEAM 1 - Stepdown/ICU TEAM  Subjective: 78 y/o male who presented for weakness, falls, confusion and pedal edema. He has CKD and follows with Dr Allena Katz as outpt. He was found to have worsening of his renal function. He has a graft in LUE. The patient initially stated that he did NOT want dialysis.   The pt is resting comfortably at present.  He denies f/c, sob, n/v, or abdom pain.  He states that he is tolerating HD w/o significant difficulty thus far.    Objective: Weight change: 0 kg (0 lb)  Intake/Output Summary (Last 24 hours) at 04/14/11 1338 Last data filed at 04/14/11 1200  Gross per 24 hour  Intake      0 ml  Output   4203 ml  Net  -4203 ml   Blood pressure 137/63, pulse 92, temperature 98.5 F (36.9 C), temperature source Axillary, resp. rate 17, height 6' (1.829 m), weight 64.7 kg (142 lb 10.2 oz), SpO2 100.00%.  CBG (last 3)   Basename 04/12/11 1834  GLUCAP 102*   Physical Exam: General: No acute respiratory distress Lungs: Clear to auscultation bilaterally without wheezes with exception to mild bibasilar crackles R>L Cardiovascular: Regular rate and rhythm without murmur gallop or rub  Abdomen: Nontender, nondistended, soft, bowel sounds positive, no rebound, no ascites, no appreciable mass Extremities: No significant cyanosis, clubbing, or edema bilateral lower extremities  Lab Results:  Basename 04/14/11 0928 04/13/11 1307 04/13/11 0007 04/12/11 1842  NA 144 146* -- 143  K 3.3* 4.0 -- 4.2  CL 110 113* -- 113*  CO2 17* 14* -- 9*  GLUCOSE 74 115* -- 104*  BUN 70* 95* -- 95*  CREATININE 6.61* 8.13* 7.28* --  CALCIUM 7.1* 6.5* -- 6.8*  MG -- 2.2 -- --  PHOS 6.2* 8.7* -- --    Basename 04/14/11 0928 04/13/11 1307 04/12/11 1842  AST -- 16 11  ALT -- 12 13  ALKPHOS -- 76 84  BILITOT -- 0.2* 0.3  PROT -- 5.3* 6.6  ALBUMIN 2.1* 2.0* --    Basename 04/13/11 1307 04/13/11 0200 04/12/11 1842  WBC 7.5 8.5 7.8  NEUTROABS -- --  6.6  HGB 11.4* 11.5* 13.9  HCT 34.0* 34.1* 41.8  MCV 91.6 91.9 94.6  PLT 152 151 161    Basename 04/13/11 1554 04/13/11 1308 04/13/11 0009  CKTOTAL 104 107 115  CKMB 5.3* 5.8* 5.6*  CKMBINDEX -- -- --  TROPONINI <0.30 <0.30 <0.30    Micro Results: Recent Results (from the past 240 hour(s))  MRSA PCR SCREENING     Status: Abnormal   Collection Time   04/13/11  2:58 AM      Component Value Range Status Comment   MRSA by PCR POSITIVE (*) NEGATIVE  Final   URINE CULTURE     Status: Normal   Collection Time   04/13/11  4:30 AM      Component Value Range Status Comment   Specimen Description URINE, CATHETERIZED   Final    Special Requests NONE   Final    Culture  Setup Time [id number]   Final    Colony Count NO GROWTH   Final    Culture NO GROWTH   Final    Report Status 04/14/2011 FINAL   Final    Studies/Results: All recent x-ray/radiology reports have been reviewed in detail.   Medications: I have reviewed the patient's complete medication list.  Assessment/Plan:  Acute on Chronic Renal Failure - Stage V  Has now agreed to begin HD - Nephrology is following - tolerating HD well thus far - BUN is falling and MS appears to be improving in turn  Pedal edema Resolved on exam today  Delirium/Uremia Improving w/ HD as noted above   RLL infiltrate? No clinical signs to suggest an active PNA, but CXR findings are concerning - will recheck CXR in AM - consider d/c of abx if no persistent infiltrate on CXR unless s/sx of PNA are noted  HTN Well controlled at present  Chronic systolic CHF EF 35-40% - volume being managed via HD - euvolemic on exam today  +UA No s/sx of UTI - is on Levaquin at present - no change in tx today - f/u culture data  Dispo Stable for transfer to Renal floor  Lonia Blood, MD [facility] Office  [phone] Pager [phone]  On-Call/Text Page:      [email or web address]      password [facility]

## 2011-04-14 NOTE — Progress Notes (Signed)
Hemodialysis note: Tolerating hemodialysis today via AVF.  He appears slightly more animated today. No hemodynamic issues. Edema is decreased. Will begin planning for outpatient HD, but suspect discharge planning may be problematic as patient lives alone.Next  HD treatment probably Friday. [name] C

## 2011-04-14 NOTE — Clinical Documentation Improvement (Signed)
CHANGE MENTAL STATUS DOCUMENTATION CLARIFICATION   THIS DOCUMENT IS NOT A PERMANENT PART OF THE MEDICAL RECORD  TO RESPOND TO THE THIS QUERY, FOLLOW THE INSTRUCTIONS BELOW:  1. If needed, update documentation for the patient's encounter via the notes activity.  2. Access this query again and click edit on the In [employer].  3. After updating, or not, click F2 to complete all highlighted (required) fields concerning your review. Select "additional documentation in the medical record" OR "no additional documentation provided".  4. Click Sign note button.  5. The deficiency will fall out of your In Basket *Please let us know if you are not able to complete this workflow by phone or e-mail (listed below).         04/14/11  Dear [facility]. [facility]  In an effort to better capture your patient's severity of illness, reflect appropriate length of stay and utilization of resources, a review of the patient medical record has revealed the following indicators.    Based on your clinical judgment, please clarify and document in a progress note and/or discharge summary the clinical condition associated with the following supporting information:  In responding to this query please exercise your independent judgment.  The fact that a query is asked, does not imply that any particular answer is desired or expected.  Possible Clinical Conditions  _______Encephalopathy (describe type if known)                       _______ Metabolic                       _______ Toxic                       _______ Uremic   _______Acute delirium  _______Other Condition  _______Cannot Clinically Determine    Supporting Information:  Risk Factors: "Acute Renal Failure/CKD 5" per notes "Severe metabolic acidosis" per notes ?RLL infiltrate per notes   Signs & Symptoms: "Appears slightly more animated today" per 4/3 notes "Confusion/delirium" per notes   Treatment: Continuous  monitoring Correcting Acidosis Receiving Hemodialysis   Reviewed: additional documentation in the medical record   Thank You,   Rossie Muskrat RN, BSN Clinical Documentation Specialist Pager:  [phone] [email or web address]  Health Information Management [place]

## 2011-04-14 NOTE — Progress Notes (Signed)
Pt successfully transferred to [id number] with his daughter at bedside.

## 2011-04-14 NOTE — Progress Notes (Signed)
04/14/2011 [facility], Bosie Clos SPARKS Case Management Note [phone]    CARE MANAGEMENT NOTE 04/14/2011  Patient:  [name],[name]   Account Number:  [id number]  Date Initiated:  04/14/2011  Documentation initiated by:  Fransico Michael  Subjective/Objective Assessment:   admitted on 4/1 with acute on chronic renal failure     Action/Plan:   prior to admission, patient lived at home alone.   Anticipated DC Date:  04/17/2011   Anticipated DC Plan:  SKILLED NURSING FACILITY      DC Planning Services  CM consult      Choice offered to / List presented to:             Status of service:  In process, will continue to follow Medicare Important Message given?   (If response is "NO", the following Medicare IM given date fields will be blank) Date Medicare IM given:   Date Additional Medicare IM given:    Discharge Disposition:    Per UR Regulation:  Reviewed for med. necessity/level of care/duration of stay  If discussed at Long Length of Stay Meetings, dates discussed:    Comments:  04/14/11-1421-J.Columbia Pandey,RN,BSN [phone]      Spoke with Darel Hong in hemodialysis regarding Clip process. HD aware of the patient and has started the process for outpatient HD. CM will continue to monitor.  04/14/11-1415-J.Lyndal Alamillo,RN,BSN [phone]      78 yo male patient admitted on 04/12/11 with creatinine of 8 and  bicarb of 9. Noted by home health aide at home to be confused with gross lower extremity edema. Prior to admission, patient lived at home alone with family support. Also has a home health aide through [facility]. Anticipated discharge plan will be to skilled nursing versus home with home health. Hemodialysis, which the patient has always refused, has been agreed to and initiated this admission.

## 2011-04-15 ENCOUNTER — Inpatient Hospital Stay (HOSPITAL_COMMUNITY): Payer: Medicare Other

## 2011-04-15 ENCOUNTER — Encounter (HOSPITAL_COMMUNITY): Payer: BC Managed Care – PPO

## 2011-04-15 DIAGNOSIS — I5022 Chronic systolic (congestive) heart failure: Secondary | ICD-10-CM | POA: Diagnosis present

## 2011-04-15 DIAGNOSIS — G9349 Other encephalopathy: Secondary | ICD-10-CM | POA: Diagnosis present

## 2011-04-15 MED ORDER — CHLORHEXIDINE GLUCONATE CLOTH 2 % EX PADS
6.0000 | MEDICATED_PAD | Freq: Every morning | CUTANEOUS | Status: DC
  Administered 2011-04-15 – 2011-04-19 (×5): 6 via TOPICAL

## 2011-04-15 MED ORDER — HEPARIN SODIUM (PORCINE) 1000 UNIT/ML DIALYSIS
20.0000 [IU]/kg | INTRAMUSCULAR | Status: DC | PRN
  Filled 2011-04-15: qty 2

## 2011-04-15 NOTE — Progress Notes (Signed)
Nutrition Follow-up  Pt now agreeable to HD. First HD treatment on 4/2. Pt not interested in talking to this RD. Providing 1 word answers to questions and not making eye contact. Discussed need for diet education, pt declined at this time. Discussed pt's mood with RN and SW. Consumed 50% of breakfast.  Diet Order:  Renal 80/90 Supplement: Nepro Shake daily  Meds: Scheduled Meds:   . aspirin  81 mg Oral Daily  . calcium acetate (Phos Binder)  667 mg Oral TID WC  . carvedilol  25 mg Oral BID WC  . Chlorhexidine Gluconate Cloth  6 each Topical q morning - 10a  . feeding supplement (NEPRO CARB STEADY)  237 mL Oral Q1400  . heparin  5,000 Units Subcutaneous Q8H  . hydrALAZINE  10 mg Oral Q8H  . isosorbide mononitrate  30 mg Oral Daily  . levofloxacin (LEVAQUIN) IV  500 mg Intravenous Q48H  . multivitamin  1 tablet Oral Daily  . mupirocin ointment  1 application Nasal BID  . niacin  250 mg Oral 1 day or 1 dose  . simvastatin  10 mg Oral 1 day or 1 dose  . sodium chloride  3 mL Intravenous Q12H  . DISCONTD: Chlorhexidine Gluconate Cloth  6 each Topical Q0600  . DISCONTD: sodium chloride  3 mL Intravenous Q12H   Continuous Infusions:  PRN Meds:.acetaminophen, acetaminophen, lanolin, ondansetron (ZOFRAN) IV, ondansetron, DISCONTD: lanolin/mineral oil  Labs:  CMP     Component Value Date/Time   NA 144 04/14/2011 0928   K 3.3* 04/14/2011 0928   CL 110 04/14/2011 0928   CO2 17* 04/14/2011 0928   GLUCOSE 74 04/14/2011 0928   BUN 70* 04/14/2011 0928   CREATININE 6.61* 04/14/2011 0928   CALCIUM 7.1* 04/14/2011 0928   CALCIUM 7.5* 11/30/2006 1740   PROT 5.3* 04/13/2011 1307   ALBUMIN 2.1* 04/14/2011 0928   AST 16 04/13/2011 1307   ALT 12 04/13/2011 1307   ALKPHOS 76 04/13/2011 1307   BILITOT 0.2* 04/13/2011 1307   GFRNONAA 7* 04/14/2011 0928   GFRAA 8* 04/14/2011 0928  Phosphorus 6.2H   Intake/Output Summary (Last 24 hours) at 04/15/11 1005 Last data filed at 04/15/11 0631  Gross per 24 hour  Intake    463 ml   Output   1732 ml  Net  -1269 ml    Weight Status:  64.7 kg s/p HD on 4/3 - wt trending down since admission likely to fluid removal with HD  Re-estimated needs:  2100 - 2300 kcal, 78 - 90 grams protein, 1.2 L/d  Nutrition Dx:  Inadequate oral intake r/t weakness AEB BMI 19.6 suboptimal for age and build. Ongoing.  Goal:   1. Food/Beverage; pt to consume foods as desired  2. GOC; RD to monitor for nutrition-related plan and wishes  Intervention:   1. Supplements; Nepro once daily  2. Will attempt to educate pt at later time  Monitor:  Weights, labs, PO Intake, I/O's  Adair Laundry Pager #:  [phone]

## 2011-04-15 NOTE — Progress Notes (Signed)
Clinical Social Work Department BRIEF PSYCHOSOCIAL ASSESSMENT 04/15/2011  Patient:  [name],[name]     Account Number:  [id number]     Admit date:  04/12/2011  Clinical Social Worker:  Dennison Bulla  Date/Time:  04/15/2011 11:30 AM  Referred by:  Physician  Date Referred:  04/15/2011 Referred for  Transportation assistance   Other Referral:   Interview type:  Patient Other interview type:    PSYCHOSOCIAL DATA Living Status:  ALONE Admitted from facility:   Level of care:   Primary support name:  [name] Primary support relationship to patient:  CHILD, ADULT Degree of support available:   Adequate    CURRENT CONCERNS Current Concerns  Other - See comment   Other Concerns:   Transportation to dialysis    SOCIAL WORK ASSESSMENT / PLAN CSW received referral due to patient being a new dialysis patient. MD requested that CSW check to make sure that patient had transportation to dialysis. CSW met with patient at bedside. Patient reported that he had spoken to neighbors and friends that offered to give him a ride to dialysis. CSW spoke with patient regarding SCAT and completing an application as a back up plan in case someone could not transport him. Patient was not interested in completing application. CSW spoke with patient regarding his ability to be safe if he returned home by himself. Patient reported that he was unsure of his dc plans but wanted to wait and see how strong he was before making any decisions. CSW agreed to follow up with patient after PT/OT evaluation to determine his level of care. CSW will continue to follow   Assessment/plan status:  Psychosocial Support/Ongoing Assessment of Needs Other assessment/ plan:   Information/referral to community resources:   SCAT information    PATIENT'S/FAMILY'S RESPONSE TO PLAN OF CARE: Patient was alert and oriented during assessment but disengaged. Patient avoided eye contact and gave brief answers. Patient reports that he  does not want to complete SCAT application and reports that he does not want to make a decision regarding placement at this time.

## 2011-04-15 NOTE — Progress Notes (Signed)
  [facility] Progress Note   Assessment:  1 Uremic Syndrome (acidosis, volume overload, mental status changes)  2 Volume Overload w/ increased right pleural fluid by CXR Plan:  1 Begin hemodialysis via AV access  2 "CLIP" patient for outpatient dialysis  3 Consider short term placement or rehab  4 HD Saturday  Subjective:   PERHAPS BETTER   Objective:   BP 134/67  Pulse 81  Temp(Src) 98.6 F (37 C) (Oral)  Resp 20  Ht 6' (1.829 m)  Wt 64.7 kg (142 lb 10.2 oz)  BMI 19.35 kg/m2  SpO2 94%  Intake/Output Summary (Last 24 hours) at 04/15/11 1139 Last data filed at 04/15/11 1100  Gross per 24 hour  Intake    583 ml  Output    232 ml  Net    351 ml   Weight change: -0.4 kg (-14.1 oz)  Physical Exam: BJY:NWGN male, more alert CVS:RRR FAO:ZHYQ MVH:QIONG markedly improved, skin scaly  Imaging: Dg Chest Port 1 View  04/15/2011  *RADIOLOGY REPORT*  Clinical Data: Shortness of breath, follow up infiltrate  PORTABLE CHEST - 1 VIEW  Comparison: Chest x-ray of 04/12/2011  Findings: There has been some worsening of airspace disease bilaterally right greater than left primarily perihilar.  This is most consistent with pulmonary vascular congestion and mild edema with probable right pleural effusion.  Mild cardiomegaly is stable.  IMPRESSION: Some worsening of airspace disease most suggestive of edema with probable right effusion.  Original Report Authenticated By: Juline Patch, M.D.    Labs: BMET  Lab 04/14/11 2952 04/13/11 1307 04/13/11 0007 04/12/11 1842  NA 144 146* -- 143  K 3.3* 4.0 -- 4.2  CL 110 113* -- 113*  CO2 17* 14* -- 9*  GLUCOSE 74 115* -- 104*  BUN 70* 95* -- 95*  CREATININE 6.61* 8.13* 7.28* 8.28*  ALB -- -- -- --  CALCIUM 7.1* 6.5* -- 6.8*  PHOS 6.2* 8.7* -- --   CBC  Lab 04/13/11 1307 04/13/11 0200 04/12/11 1842  WBC 7.5 8.5 7.8  NEUTROABS -- -- 6.6  HGB 11.4* 11.5* 13.9  HCT 34.0* 34.1* 41.8  MCV 91.6 91.9 94.6  PLT 152 151 161     Medications:      . aspirin  81 mg Oral Daily  . calcium acetate (Phos Binder)  667 mg Oral TID WC  . carvedilol  25 mg Oral BID WC  . Chlorhexidine Gluconate Cloth  6 each Topical q morning - 10a  . feeding supplement (NEPRO CARB STEADY)  237 mL Oral Q1400  . heparin  5,000 Units Subcutaneous Q8H  . hydrALAZINE  10 mg Oral Q8H  . isosorbide mononitrate  30 mg Oral Daily  . multivitamin  1 tablet Oral Daily  . mupirocin ointment  1 application Nasal BID  . niacin  250 mg Oral 1 day or 1 dose  . simvastatin  10 mg Oral 1 day or 1 dose  . sodium chloride  3 mL Intravenous Q12H  . DISCONTD: Chlorhexidine Gluconate Cloth  6 each Topical Q0600  . DISCONTD: levofloxacin (LEVAQUIN) IV  500 mg Intravenous Q48H  . DISCONTD: sodium chloride  3 mL Intravenous Q12H

## 2011-04-15 NOTE — Progress Notes (Addendum)
Narrative: 78 y/o male who presented for weakness, falls, confusion and pedal edema. He has CKD and follows with Dr Allena Katz as outpt. He was found to have worsening of his renal function. He has a graft in LUE.  Started on HD during this admission   Objective: Weight change: -0.4 kg (-14.1 oz)  Intake/Output Summary (Last 24 hours) at 04/15/11 1021 Last data filed at 04/15/11 0631  Gross per 24 hour  Intake    463 ml  Output   1732 ml  Net  -1269 ml   Blood pressure 151/74, pulse 81, temperature 98.3 F (36.8 C), temperature source Oral, resp. rate 18, height 6' (1.829 m), weight 64.7 kg (142 lb 10.2 oz), SpO2 93.00%. Patient Vitals for the past 24 hrs:  BP Temp Temp src Pulse Resp SpO2 Height Weight  04/15/11 0512 151/74 mmHg 98.3 F (36.8 C) Oral 81  18  93 % - -  04/14/11 2057 133/70 mmHg 99 F (37.2 C) Oral 76  18  95 % 6' (1.829 m) 64.7 kg (142 lb 10.2 oz)  04/14/11 1700 123/68 mmHg 98.2 F (36.8 C) Oral 79  18  98 % - -  04/14/11 1500 127/55 mmHg - - 77  18  99 % - -  04/14/11 1428 136/61 mmHg - - - - - - -  04/14/11 1215 137/63 mmHg 98.5 F (36.9 C) Axillary 92  17  100 % - -  04/14/11 1150 144/89 mmHg - - 55  13  100 % - -  04/14/11 1113 134/70 mmHg 97.2 F (36.2 C) Oral 77  14  99 % - 64.7 kg (142 lb 10.2 oz)  04/14/11 1100 129/71 mmHg - - 77  15  - - -  04/14/11 1030 125/64 mmHg - - 72  16  - - -    CBG (last 3)   Basename 04/12/11 1834  GLUCAP 102*   Physical Exam: arousable verbal  Not willing to have a conversation CVS: RRR RS: CTAB  Lab Results:  Basename 04/14/11 0928 04/13/11 1307 04/13/11 0007 04/12/11 1842  NA 144 146* -- 143  K 3.3* 4.0 -- 4.2  CL 110 113* -- 113*  CO2 17* 14* -- 9*  GLUCOSE 74 115* -- 104*  BUN 70* 95* -- 95*  CREATININE 6.61* 8.13* 7.28* --  CALCIUM 7.1* 6.5* -- 6.8*  MG -- 2.2 -- --  PHOS 6.2* 8.7* -- --    Basename 04/14/11 0928 04/13/11 1307 04/12/11 1842  AST -- 16 11  ALT -- 12 13  ALKPHOS -- 76 84  BILITOT --  0.2* 0.3  PROT -- 5.3* 6.6  ALBUMIN 2.1* 2.0* --    Basename 04/13/11 1307 04/13/11 0200 04/12/11 1842  WBC 7.5 8.5 7.8  NEUTROABS -- -- 6.6  HGB 11.4* 11.5* 13.9  HCT 34.0* 34.1* 41.8  MCV 91.6 91.9 94.6  PLT 152 151 161    Basename 04/13/11 1554 04/13/11 1308 04/13/11 0009  CKTOTAL 104 107 115  CKMB 5.3* 5.8* 5.6*  CKMBINDEX -- -- --  TROPONINI <0.30 <0.30 <0.30    Micro Results: Recent Results (from the past 240 hour(s))  MRSA PCR SCREENING     Status: Abnormal   Collection Time   04/13/11  2:58 AM      Component Value Range Status Comment   MRSA by PCR POSITIVE (*) NEGATIVE  Final   URINE CULTURE     Status: Normal   Collection Time   04/13/11  4:30 AM  Component Value Range Status Comment   Specimen Description URINE, CATHETERIZED   Final    Special Requests NONE   Final    Culture  Setup Time [id number]   Final    Colony Count NO GROWTH   Final    Culture NO GROWTH   Final    Report Status 04/14/2011 FINAL   Final   URINE CULTURE     Status: Normal   Collection Time   04/13/11  3:52 PM      Component Value Range Status Comment   Specimen Description URINE, CATHETERIZED   Final    Special Requests NONE   Final    Culture  Setup Time [id number]   Final    Colony Count NO GROWTH   Final    Culture NO GROWTH   Final    Report Status 04/14/2011 FINAL   Final    CXR 4/4 PORTABLE CHEST - 1 VIEW  Comparison: Chest x-ray of 04/12/2011  Findings: There has been some worsening of airspace disease  bilaterally right greater than left primarily perihilar. This is  most consistent with pulmonary vascular congestion and mild edema  with probable right pleural effusion. Mild cardiomegaly is stable.  IMPRESSION:  Some worsening of airspace disease most suggestive of edema with  probable right effusion.  Medications:    . aspirin  81 mg Oral Daily  . calcium acetate (Phos Binder)  667 mg Oral TID WC  . carvedilol  25 mg Oral BID WC  . Chlorhexidine Gluconate  Cloth  6 each Topical q morning - 10a  . feeding supplement (NEPRO CARB STEADY)  237 mL Oral Q1400  . heparin  5,000 Units Subcutaneous Q8H  . hydrALAZINE  10 mg Oral Q8H  . isosorbide mononitrate  30 mg Oral Daily  . multivitamin  1 tablet Oral Daily  . mupirocin ointment  1 application Nasal BID  . niacin  250 mg Oral 1 day or 1 dose  . simvastatin  10 mg Oral 1 day or 1 dose  . sodium chloride  3 mL Intravenous Q12H  . DISCONTD: Chlorhexidine Gluconate Cloth  6 each Topical Q0600  . DISCONTD: levofloxacin (LEVAQUIN) IV  500 mg Intravenous Q48H  . DISCONTD: sodium chloride  3 mL Intravenous Q12H    Assessment/Plan: Principal Problem:  *Uremic encephalopathy Active Problems:  Renal failure (ARF), acute on chronic  Metabolic acidosis  HTN (hypertension)  Hyperlipidemia  Tobacco abuse  Chronic systolic congestive heart failure   Acute on Chronic Renal Failure - Stage V Has now agreed to begin HD - Nephrology is following - tolerating HD well thus far - BUN is falling and MS appears to be improving in turn Arrangements are being made for outpatient HD  Uremic encephalopathy Improving w/ HD as noted above   RLL infiltrate? No clinical signs to suggest an active PNA,  Probably due to volume overload abx stopped  HTN Well controlled at present  Chronic systolic CHF EF 35-40% - volume being managed via HD - euvolemic on exam today   Dispo Will get PT evaluation    [name] [phone]

## 2011-04-16 ENCOUNTER — Inpatient Hospital Stay (HOSPITAL_COMMUNITY): Payer: Medicare Other

## 2011-04-16 LAB — RENAL FUNCTION PANEL
CO2: 17 mEq/L — ABNORMAL LOW (ref 19–32)
Calcium: 6.5 mg/dL — ABNORMAL LOW (ref 8.4–10.5)
Chloride: 104 mEq/L (ref 96–112)
GFR calc Af Amer: 9 mL/min — ABNORMAL LOW (ref >90)
GFR calc non Af Amer: 8 mL/min — ABNORMAL LOW (ref >90)
Glucose, Bld: 98 mg/dL (ref 70–99)
Potassium: 3.5 mEq/L (ref 3.5–5.1)
Sodium: 138 mEq/L (ref 135–145)

## 2011-04-16 LAB — CBC
Hemoglobin: 10.6 g/dL — ABNORMAL LOW (ref 13.0–17.0)
MCH: 30.9 pg (ref 26.0–34.0)
Platelets: 169 10*3/uL (ref 150–400)
RBC: 3.43 MIL/uL — ABNORMAL LOW (ref 4.22–5.81)
WBC: 12.4 10*3/uL — ABNORMAL HIGH (ref 4.0–10.5)

## 2011-04-16 NOTE — Progress Notes (Signed)
Narrative: 78 y/o male who presented for weakness, falls, confusion and pedal edema. He has CKD and follows with Dr Allena Katz as outpt. He was found to have worsening of his renal function, pulmonary edema, uremia, acidosis. He has a graft in LUE.  Started on HD during this admission  No new events   Objective: Weight change: -6.1 kg (-13 lb 7.2 oz)  Intake/Output Summary (Last 24 hours) at 04/16/11 1520 Last data filed at 04/16/11 1346  Gross per 24 hour  Intake   1140 ml  Output   3000 ml  Net  -1860 ml   Blood pressure 116/80, pulse 75, temperature 98.2 F (36.8 C), temperature source Oral, resp. rate 18, height 6' (1.829 m), weight 57.3 kg (126 lb 5.2 oz), SpO2 96.00%. Patient Vitals for the past 24 hrs:  BP Temp Temp src Pulse Resp SpO2 Weight  04/16/11 1100 116/80 mmHg 98.2 F (36.8 C) Oral 75  18  96 % -  04/16/11 1033 132/75 mmHg 97.1 F (36.2 C) Oral 81  18  95 % 57.3 kg (126 lb 5.2 oz)  04/16/11 1030 111/60 mmHg - - 78  - - -  04/16/11 1000 127/73 mmHg - - 72  16  - -  04/16/11 0930 134/68 mmHg - - 80  17  - -  04/16/11 0900 122/72 mmHg - - 83  16  - -  04/16/11 0837 120/70 mmHg - - 80  15  - -  04/16/11 0830 132/68 mmHg - - 75  15  - -  04/16/11 0800 126/67 mmHg - - 79  16  - -  04/16/11 0728 111/63 mmHg - - 78  15  - -  04/16/11 0715 134/67 mmHg 97.9 F (36.6 C) Oral 77  16  94 % 60.6 kg (133 lb 9.6 oz)  04/16/11 0558 130/79 mmHg 98.7 F (37.1 C) Oral 82  18  97 % -  04/15/11 2300 - - - - - - 59.3 kg (130 lb 11.7 oz)  04/15/11 2205 122/65 mmHg 98.6 F (37 C) Oral 74  18  98 % -  04/15/11 1800 128/62 mmHg 98.5 F (36.9 C) Oral 75  20  96 % -    CBG (last 3)  No results found for this basename: GLUCAP:3 in the last 72 hours Physical Exam: arousable verbal  Not willing to have a conversation CVS: RRR RS: CTAB  Lab Results:  [facility] 04/16/11 0733 04/14/11 0928  NA 138 144  K 3.5 3.3*  CL 104 110  CO2 17* 17*  GLUCOSE 98 74  BUN 62* 70*  CREATININE 6.00*  6.61*  CALCIUM 6.5* 7.1*  MG -- --  PHOS 5.3* 6.2*    Basename 04/16/11 0733 04/14/11 0928  AST -- --  ALT -- --  ALKPHOS -- --  BILITOT -- --  PROT -- --  ALBUMIN 2.1* 2.1*    Basename 04/16/11 0733  WBC 12.4*  NEUTROABS --  HGB 10.6*  HCT 30.9*  MCV 90.1  PLT 169    Basename 04/13/11 1554  CKTOTAL 104  CKMB 5.3*  CKMBINDEX --  TROPONINI <0.30    Micro Results: Recent Results (from the past 240 hour(s))  MRSA PCR SCREENING     Status: Abnormal   Collection Time   04/13/11  2:58 AM      Component Value Range Status Comment   MRSA by PCR POSITIVE (*) NEGATIVE  Final   URINE CULTURE     Status: Normal  Collection Time   04/13/11  4:30 AM      Component Value Range Status Comment   Specimen Description URINE, CATHETERIZED   Final    Special Requests NONE   Final    Culture  Setup Time [id number]   Final    Colony Count NO GROWTH   Final    Culture NO GROWTH   Final    Report Status 04/14/2011 FINAL   Final   URINE CULTURE     Status: Normal   Collection Time   04/13/11  3:52 PM      Component Value Range Status Comment   Specimen Description URINE, CATHETERIZED   Final    Special Requests NONE   Final    Culture  Setup Time [id number]   Final    Colony Count NO GROWTH   Final    Culture NO GROWTH   Final    Report Status 04/14/2011 FINAL   Final    CXR 4/4 PORTABLE CHEST - 1 VIEW  Comparison: Chest x-ray of 04/12/2011  Findings: There has been some worsening of airspace disease  bilaterally right greater than left primarily perihilar. This is  most consistent with pulmonary vascular congestion and mild edema  with probable right pleural effusion. Mild cardiomegaly is stable.  IMPRESSION:  Some worsening of airspace disease most suggestive of edema with  probable right effusion.  Medications:    . aspirin  81 mg Oral Daily  . calcium acetate (Phos Binder)  667 mg Oral TID WC  . carvedilol  25 mg Oral BID WC  . Chlorhexidine Gluconate Cloth  6  each Topical q morning - 10a  . feeding supplement (NEPRO CARB STEADY)  237 mL Oral Q1400  . heparin  5,000 Units Subcutaneous Q8H  . isosorbide mononitrate  30 mg Oral Daily  . multivitamin  1 tablet Oral Daily  . mupirocin ointment  1 application Nasal BID  . niacin  250 mg Oral 1 day or 1 dose  . simvastatin  10 mg Oral 1 day or 1 dose  . sodium chloride  3 mL Intravenous Q12H    Assessment/Plan: Principal Problem:  *Uremic encephalopathy Active Problems:  Renal failure (ARF), acute on chronic  Metabolic acidosis  HTN (hypertension)  Hyperlipidemia  Tobacco abuse  Chronic systolic congestive heart failure   Acute on Chronic Renal Failure - Stage V Has now agreed to begin HD - Nephrology is following - tolerating HD well thus far - BUN is falling and MS appears to be improving in turn outpatient HD arranged already   Uremic encephalopathy Improving w/ HD as noted above   RLL infiltrate? No clinical signs to suggest an active PNA,  Probably due to volume overload abx stopped  HTN Well controlled at present  Chronic systolic CHF EF 35-40% - volume being managed via HD - euvolemic on exam today   Dispo PT evaluation  Requesting CIR eval     Pavan Bring [phone]

## 2011-04-16 NOTE — Progress Notes (Signed)
Tolerating hemodialysis. Currently in progress.  Feels better. Agreeable to continue with hemodialysis. CLipped a [facility] MWF Second shift.  Assessment:  1 Uremic Syndrome (acidosis, volume overload, mental status changes), improved 2 Volume Overload w/ increased right pleural fluid by CXR  Plan:  On MWF schedule as outpatient.  Merri Dimaano C

## 2011-04-16 NOTE — Progress Notes (Addendum)
CSW met with patient to discuss his plans after dc. 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 CSW spoke with patient regarding going to [facility] after dc. Patient reported that he wants to think about his plans before making any decisions. Patient does not want to be faxed out at this point but wishes to review SNF list. CSW received permission to call his daughter. CSW left a message for Velma (dtr). CSW will continue to follow. [place], [place] [phone]  CSW spoke with dtr who requested a SNF search as a back up plan to CIR. CSW completed FL2 and faxed out to [facility]. CSW will continue to follow. [place], [place] [phone]

## 2011-04-16 NOTE — Plan of Care (Signed)
Problem: Food- and Nutrition-Related Knowledge Deficit (NB-1.1) Goal: Nutrition education Formal process to instruct or train a patient/client in a skill or to impart knowledge to help patients/clients voluntarily manage or modify food choices and eating behavior to maintain or improve health.  Outcome: Completed/Met Date Met:  04/16/11 RD provided renal diet education with daughter at bedside. Daughter reports pt has CNA caregiver care for her father. Reviewed Choose-A-Meal booklet with recommended serving sizes of renal-friendly foods. Daughter asked appropriate questions and verbalized understanding. Pt was consuming lunch and stated appetite was "okay."  RD to continue to follow and reinforce education as able.  Adair Laundry RD Pager#: [phone]

## 2011-04-16 NOTE — Evaluation (Signed)
Occupational Therapy Evaluation Patient Details Name: Natnael Biederman MRN: [id number] DOB: 08-Jan-1926 Today's Date: 04/16/2011  Problem List:  Patient Active Problem List  Diagnoses  . Renal failure (ARF), acute on chronic  . Metabolic acidosis  . HTN (hypertension)  . Hyperlipidemia  . Tobacco abuse  . Uremic encephalopathy  . Chronic systolic congestive heart failure    Past Medical History:  Past Medical History  Diagnosis Date  . Hypertension   . Chronic kidney disease   . CHF (congestive heart failure)   . BPH (benign prostatic hyperplasia)   . Prostate cancer    Past Surgical History:  Past Surgical History  Procedure Date  . Prostate biopsy     OT Assessment/Plan/Recommendation OT Assessment Clinical Impression Statement: This 78 year old man was admitted with uremic encephalopathy.  He has had multiple falls at home, and had help with adls/iadls; caregiver comes 5 hours a day.  He is appropriate for skilled OT to increase safety and independence focusing on toilet transfers/hygiene.  Goals are supervision level OT Recommendation/Assessment: Patient will need skilled OT in the acute care venue OT Problem List: Decreased strength;Decreased activity tolerance;Impaired balance (sitting and/or standing);Decreased safety awareness Barriers to Discharge: Decreased caregiver support OT Therapy Diagnosis : Generalized weakness OT Plan OT Frequency: Min 1X/week OT Treatment/Interventions: Self-care/ADL training;DME and/or AE instruction;Therapeutic activities;Patient/family education;Balance training;Other (comment) (safety) OT Recommendation Follow Up Recommendations: Skilled nursing facility (vs. CIR, depending on activity tolerance) Equipment Recommended: Defer to next venue Individuals Consulted Consulted and Agree with Results and Recommendations: Patient OT Goals Acute Rehab OT Goals OT Goal Formulation: With patient Time For Goal Achievement: 2 weeks ADL Goals Pt  Will Perform Grooming: with supervision ADL Goal: Grooming - Progress: Goal set today Pt Will Transfer to Toilet: with supervision;Ambulation;3-in-1;with cueing (comment type and amount) (no more than 1 vc for safety) ADL Goal: Toilet Transfer - Progress: Goal set today Pt Will Perform Toileting - Clothing Manipulation: with supervision;Standing ADL Goal: Toileting - Clothing Manipulation - Progress: Goal set today Pt Will Perform Toileting - Hygiene: with supervision;Sit to stand from 3-in-1/toilet ADL Goal: Toileting - Hygiene - Progress: Goal set today  OT Evaluation Precautions/Restrictions  Precautions Precautions: Fall Restrictions Weight Bearing Restrictions: No Prior Functioning Home Living Lives With: Alone Receives Help From: Personal care attendant Type of Home: House Home Layout: One level Home Access: Level entry Bathroom Shower/Tub: Walk-in shower;Door [employer] Toilet: Standard Bathroom Accessibility: Yes How Accessible: Accessible via walker Home Adaptive Equipment: Built-in shower seat;Walker - rolling Prior Function Level of Independence: Independent with gait;Needs assistance with ADLs;Independent with transfers;Requires assistive device for independence Able to Take Stairs?: No Driving: Yes Vocation: Retired Leisure: Hobbies-yes (Comment) ADL ADL Eating/Feeding: Simulated;Independent Where Assessed - Eating/Feeding: Chair Grooming: Simulated;Set up Where Assessed - Grooming: Sitting, chair;Supported Upper Body Bathing: Simulated;Set up Where Assessed - Upper Body Bathing: Sitting, chair;Supported Lower Body Bathing: Simulated;Moderate assistance Where Assessed - Lower Body Bathing: Sit to stand from chair Upper Body Dressing: Simulated;Set up Where Assessed - Upper Body Dressing: Sitting, chair;Supported Lower Body Dressing: Simulated;Maximal assistance Where Assessed - Lower Body Dressing: Sit to stand from chair Toilet Transfer: Simulated;Minimal  assistance Toilet Transfer Details (indicate cue type and reason): min cues for safety--holding walker during turn from chair to bed Toilet Transfer Method: Stand pivot Toilet Transfer Equipment:  (chair to bed to another bedside chair) Toileting - Clothing Manipulation: Simulated;Minimal assistance Where Assessed - Toileting Clothing Manipulation: Sit on 3-in-1 or toilet Toileting - Hygiene: Minimal assistance;Simulated Where Assessed - Toileting Hygiene:  Sit on 3-in-1 or toilet Tub/Shower Transfer: Not assessed Equipment Used: Rolling walker Ambulation Related to ADLs: transfers only ADL Comments: pt had no ADLs needs but simulated activities.  He changed his mind walking to bathroom and sat on bed instead.  Friend came to cut hair so I assisted him to another bedside chair, which was easier for friend.  He will call for assist when done:  RN alerted. Vision/Perception  Vision - History Patient Visual Report: Other (comment) (did not ask about vision:  pt getting impatient with questions) Vision - Assessment Eye Alignment: Within Functional Limits Cognition Cognition Arousal/Alertness: Awake/alert Overall Cognitive Status: Difficult to assess (annoyed with questions.  Followed commands but changed plan without telling me:  Decreased safety turning to bed ) Orientation Level: Oriented to person;Oriented to place;Disoriented to time;Disoriented to situation Sensation/Coordination Sensation Light Touch: Appears Intact Proprioception: Appears Intact Coordination Gross Motor Movements are Fluid and Coordinated: Yes Fine Motor Movements are Fluid and Coordinated: Not tested Extremity Assessment RUE Assessment RUE Assessment: Within Functional Limits (bil strength grossly 3+/5) LUE Assessment LUE Assessment: Within Functional Limits Mobility    Transfers Sit to Stand: 4: Min assist;From chair/3-in-1;From bed;With upper extremity assist Stand to Sit: 4: Min assist;To  chair/3-in-1;Other (comment) (to control descent to low chair) Stand to Sit Details: Verbal cues for safe technique.  End of Session OT - End of Session Activity Tolerance: Other (comment) (tolerated well but did not walk to bathroom as planned when friends arrived ) Patient left: in chair;with call bell in reach;with family/visitor present;Other (comment) (friends will call when done with haircut:  RN alerted) General Behavior During Session: [facility] for tasks performed Marica Otter, OTR/L [phone] 04/16/2011  Neils Siracusa 04/16/2011, 2:32 PM

## 2011-04-16 NOTE — Progress Notes (Signed)
Clinical Social Work Department CLINICAL SOCIAL WORK PLACEMENT NOTE 04/16/2011  Patient:  [name], [name]  Account Number:  [id number] Admit date:  04/12/2011  Clinical Social Worker:  Unk Lightning, LCSW  Date/time:  04/16/2011 02:00 PM  Clinical Social Work is seeking post-discharge placement for this patient at the following level of care:   SKILLED NURSING   (*CSW will update this form in Epic as items are completed)   04/16/2011  Patient/family provided with [facility] Department of Clinical Social Work's list of facilities offering this level of care within the geographic area requested by the patient (or if unable, by the patient's family).  04/16/2011  Patient/family informed of their freedom to choose among providers that offer the needed level of care, that participate in Medicare, Medicaid or managed care program needed by the patient, have an available bed and are willing to accept the patient.  04/16/2011  Patient/family informed of [facility]' ownership interest in [facility], as well as of the fact that they are under no obligation to receive care at this facility.  PASARR submitted to EDS on 04/16/2011 PASARR number received from EDS on   FL2 transmitted to all facilities in geographic area requested by pt/family on  04/16/2011 FL2 transmitted to all facilities within larger geographic area on   Patient informed that his/her managed care company has contracts with or will negotiate with  certain facilities, including the following:     Patient/family informed of bed offers received:   Patient chooses bed at  Physician recommends and patient chooses bed at    Patient to be transferred to  on   Patient to be transferred to facility by   The following physician request were entered in Epic:   Additional Comments:

## 2011-04-16 NOTE — Evaluation (Signed)
Physical Therapy Evaluation Patient Details Name: [name] MRN: [id number] DOB: 03/14/1925 Today's Date: 04/16/2011  Problem List:  Patient Active Problem List  Diagnoses  . Renal failure (ARF), acute on chronic  . Metabolic acidosis  . HTN (hypertension)  . Hyperlipidemia  . Tobacco abuse  . Uremic encephalopathy  . Chronic systolic congestive heart failure    Past Medical History:  Past Medical History  Diagnosis Date  . Hypertension   . Chronic kidney disease   . CHF (congestive heart failure)   . BPH (benign prostatic hyperplasia)   . Prostate cancer    Past Surgical History:  Past Surgical History  Procedure Date  . Prostate biopsy     PT Assessment/Plan/Recommendation PT Assessment Clinical Impression Statement: Pt is an 78 y/o male admitted from home with recent recurrence of falling.  Pt presents as significant falls risk .  Pt will require 24/7 (whenever OOB) supervision for safe return to home.  Pt only has 5 hours of paid aid assistance available.  Pt may benefit from CIR consult to address pt's strength and mobility.  If pt not a candidate for CIR , and if pt is unable to have supervision at least during waking hours short term SNF placement may be necessary.  PT Recommendation/Assessment: Patient will need skilled PT in the acute care venue PT Problem List: Decreased strength;Decreased balance;Decreased activity tolerance;Decreased mobility;Decreased knowledge of use of DME;Decreased safety awareness;Pain Barriers to Discharge: Decreased caregiver support Barriers to Discharge Comments: Paid aid only available from 12 to 5pm 7 days per week.   PT Therapy Diagnosis : Difficulty walking;Generalized weakness;Altered mental status PT Plan PT Frequency: Min 3X/week PT Treatment/Interventions: Gait training;DME instruction;Functional mobility training;Therapeutic activities;Therapeutic exercise;Balance training;Neuromuscular re-education;Patient/family education PT  Recommendation Recommendations for Other Services: Rehab consult;OT consult Follow Up Recommendations: Inpatient Rehab;Supervision/Assistance - 24 hour Equipment Recommended: None recommended by PT PT Goals  Acute Rehab PT Goals PT Goal Formulation: With patient Time For Goal Achievement: 2 weeks Pt will go Supine/Side to Sit: Independently;with HOB 0 degrees PT Goal: Supine/Side to Sit - Progress: Goal set today Pt will Sit at Edge of Bed: Independently;6-10 min;with no upper extremity support PT Goal: Sit at Edge Of Bed - Progress: Goal set today Pt will go Sit to Supine/Side: Independently;with HOB 0 degrees PT Goal: Sit to Supine/Side - Progress: Goal set today Pt will go Sit to Stand: with modified independence;with upper extremity assist PT Goal: Sit to Stand - Progress: Goal set today Pt will go Stand to Sit: with modified independence;with upper extremity assist PT Goal: Stand to Sit - Progress: Goal set today Pt will Ambulate: 16 - 50 feet;with modified independence;with rolling walker PT Goal: Ambulate - Progress: Goal set today Additional Goals Additional Goal #1: Pt will perform standard balance assessment within safe limits.  PT Goal: Additional Goal #1 - Progress: Goal set today  PT Evaluation Precautions/Restrictions  Precautions Precautions: Fall Restrictions Weight Bearing Restrictions: No Prior Functioning  Home Living Lives With: Alone Receives Help From: Personal care attendant Type of Home: House Home Layout: One level Home Access: Level entry Bathroom Shower/Tub: Walk-in shower;Door [employer] Toilet: Standard Bathroom Accessibility: Yes How Accessible: Accessible via walker Home Adaptive Equipment: Built-in shower seat;Walker - rolling Prior Function Level of Independence: Independent with gait;Needs assistance with ADLs;Independent with transfers;Requires assistive device for independence Able to Take Stairs?: No Driving: Yes Vocation:  Retired Leisure: Hobbies-yes (Comment) Cognition Cognition Arousal/Alertness: Awake/alert Overall Cognitive Status: Appears within functional limits for tasks assessed Orientation Level: Oriented  to person;Oriented to place;Disoriented to time;Disoriented to situation Sensation/Coordination Sensation Light Touch: Appears Intact Proprioception: Appears Intact Coordination Gross Motor Movements are Fluid and Coordinated: Yes Fine Motor Movements are Fluid and Coordinated: Not tested Extremity Assessment RUE Assessment RUE Assessment: Within Functional Limits LUE Assessment LUE Assessment: Within Functional Limits RLE Assessment RLE Assessment: Exceptions to [facility] RLE Strength RLE Overall Strength: Deficits RLE Overall Strength Comments: 3-/5 gross LE strength. LLE Assessment LLE Assessment: Exceptions to [facility] LLE Strength LLE Overall Strength: Deficits LLE Overall Strength Comments: 3-/5 gross LE strength. Mobility (including Balance) Bed Mobility Bed Mobility: Yes Supine to Sit: 5: Supervision;HOB flat;With rails Supine to Sit Details (indicate cue type and reason): Pt required extensive effort to complete task even with use of rail.  Transfers Transfers: Yes Sit to Stand: 5: Supervision;From bed;With upper extremity assist;From elevated surface Sit to Stand Details (indicate cue type and reason): Pt required 3 attempts to complete task without assistance.  Pt pushing from bed with Right UE and Walker with Left UE. Vebal cues to place L hand on bed, pt continued with L hand on walker.   Stand to Sit: 5: Supervision;With upper extremity assist;With armrests;To chair/3-in-1 Stand to Sit Details: Verbal cues for safe technique. Ambulation/Gait Ambulation/Gait: Yes Ambulation/Gait Assistance: 4: Min assist Ambulation/Gait Assistance Details (indicate cue type and reason): Verbal and tactile cues for safe use of RW, pt unsteady in standing.  Fatiges quickly, unable to contiue after 4  feet.  Pt reported Rt knee pain with gait after session.  Pain appears to be chronic (OA).   Ambulation Distance (Feet): 4 Feet Assistive device: Rolling walker Gait Pattern: Step-through pattern;Decreased stride length;Decreased weight shift to right Stairs: No Wheelchair Mobility Wheelchair Mobility: No  Posture/Postural Control Posture/Postural Control: No significant limitations Balance Balance Assessed: Yes Static Sitting Balance Static Sitting - Balance Support: Feet supported;Bilateral upper extremity supported Static Sitting - Level of Assistance: 5: Stand by assistance Static Sitting - Comment/# of Minutes: Pt sat on EOB for several minutes while preparing to stand.  Pt presents with posterior lean when asked to shift wt to either side. Exercise  Total Joint Exercises Ankle Circles/Pumps: AROM;Both;10 reps;Seated End of Session PT - End of Session Equipment Utilized During Treatment: Gait belt Activity Tolerance: Patient limited by fatigue Patient left: in chair;with call bell in reach Nurse Communication: Mobility status for transfers;Mobility status for ambulation General Behavior During Session: [facility] for tasks performed Cognition: [facility] for tasks performed  Seraiah Nowack 04/16/2011, 1:10 PM Amerah Puleo L. Gregery Walberg DPT [phone]

## 2011-04-17 NOTE — Progress Notes (Signed)
  [facility] Progress Note  Assessment:  1 Uremic Syndrome (acidosis, volume overload, mental status changes), improved  2 Volume Overload w/ right pleural fluid by CXR, clinically better Plan:  On MWF [facility] schedule as outpatient. SNF    Subjective:   Better   Objective:   BP 130/67  Pulse 66  Temp(Src) 98.8 F (37.1 C) (Oral)  Resp 20  Ht 6' (1.829 m)  Wt 57.3 kg (126 lb 5.2 oz)  BMI 17.13 kg/m2  SpO2 97%  Intake/Output Summary (Last 24 hours) at 04/17/11 1212 Last data filed at 04/17/11 0900  Gross per 24 hour  Intake    720 ml  Output    125 ml  Net    595 ml   Weight change: 1.3 kg (2 lb 13.9 oz)  Physical Exam: ZOX:WRUE animated Resp: comfortable AVW:UJWJ Ext:no edema Skin dry and scaly  Imaging: No results found.  Labs: BMET  Lab 04/16/11 0733 04/14/11 0928 04/13/11 1307 04/13/11 0007 04/12/11 1842  NA 138 144 146* -- 143  K 3.5 3.3* 4.0 -- 4.2  CL 104 110 113* -- 113*  CO2 17* 17* 14* -- 9*  GLUCOSE 98 74 115* -- 104*  BUN 62* 70* 95* -- 95*  CREATININE 6.00* 6.61* 8.13* 7.28* 8.28*  ALB -- -- -- -- --  CALCIUM 6.5* 7.1* 6.5* -- 6.8*  PHOS 5.3* 6.2* 8.7* -- --   CBC  Lab 04/16/11 0733 04/13/11 1307 04/13/11 0200 04/12/11 1842  WBC 12.4* 7.5 8.5 7.8  NEUTROABS -- -- -- 6.6  HGB 10.6* 11.4* 11.5* 13.9  HCT 30.9* 34.0* 34.1* 41.8  MCV 90.1 91.6 91.9 94.6  PLT 169 152 151 161    Medications:      . aspirin  81 mg Oral Daily  . calcium acetate (Phos Binder)  667 mg Oral TID WC  . carvedilol  25 mg Oral BID WC  . Chlorhexidine Gluconate Cloth  6 each Topical q morning - 10a  . feeding supplement (NEPRO CARB STEADY)  237 mL Oral Q1400  . heparin  5,000 Units Subcutaneous Q8H  . isosorbide mononitrate  30 mg Oral Daily  . multivitamin  1 tablet Oral Daily  . mupirocin ointment  1 application Nasal BID  . niacin  250 mg Oral 1 day or 1 dose  . simvastatin  10 mg Oral 1 day or 1 dose  . sodium chloride  3 mL  Intravenous Q12H   04/17/2011, 12:12 PM

## 2011-04-17 NOTE — Progress Notes (Signed)
Narrative: 78 y/o male who presented for weakness, falls, confusion and pedal edema. He has CKD and follows with Dr Allena Katz as outpt. He was found to have worsening of his renal function, pulmonary edema, uremia, acidosis. He has a graft in LUE.  Started on HD during this admission  No new events   Objective: Weight change: 1.3 kg (2 lb 13.9 oz)  Intake/Output Summary (Last 24 hours) at 04/17/11 1136 Last data filed at 04/17/11 0900  Gross per 24 hour  Intake    720 ml  Output    125 ml  Net    595 ml   Blood pressure 130/67, pulse 66, temperature 98.8 F (37.1 C), temperature source Oral, resp. rate 20, height 6' (1.829 m), weight 57.3 kg (126 lb 5.2 oz), SpO2 97.00%. Patient Vitals for the past 24 hrs:  BP Temp Temp src Pulse Resp SpO2 Weight  04/17/11 0950 130/67 mmHg 98.8 F (37.1 C) Oral 66  20  97 % -  04/17/11 0630 124/68 mmHg 98 F (36.7 C) Oral 78  20  98 % -  04/16/11 2300 128/80 mmHg 98.7 F (37.1 C) Oral 80  20  97 % 57.3 kg (126 lb 5.2 oz)  04/16/11 1746 129/77 mmHg 99 F (37.2 C) Oral 84  18  96 % -   Physical Exam: arousable verbal  Not willing to have a conversation CVS: RRR RS: CTAB  Lab Results:  [facility] 04/16/11 0733  NA 138  K 3.5  CL 104  CO2 17*  GLUCOSE 98  BUN 62*  CREATININE 6.00*  CALCIUM 6.5*  MG --  PHOS 5.3*    Basename 04/16/11 0733  AST --  ALT --  ALKPHOS --  BILITOT --  PROT --  ALBUMIN 2.1*    Basename 04/16/11 0733  WBC 12.4*  NEUTROABS --  HGB 10.6*  HCT 30.9*  MCV 90.1  PLT 169   No results found for this basename: CKTOTAL:3,CKMB:3,CKMBINDEX:3,TROPONINI:3 in the last 72 hours  Micro Results: Recent Results (from the past 240 hour(s))  MRSA PCR SCREENING     Status: Abnormal   Collection Time   04/13/11  2:58 AM      Component Value Range Status Comment   MRSA by PCR POSITIVE (*) NEGATIVE  Final   URINE CULTURE     Status: Normal   Collection Time   04/13/11  4:30 AM      Component Value Range Status Comment     Specimen Description URINE, CATHETERIZED   Final    Special Requests NONE   Final    Culture  Setup Time [id number]   Final    Colony Count NO GROWTH   Final    Culture NO GROWTH   Final    Report Status 04/14/2011 FINAL   Final   URINE CULTURE     Status: Normal   Collection Time   04/13/11  3:52 PM      Component Value Range Status Comment   Specimen Description URINE, CATHETERIZED   Final    Special Requests NONE   Final    Culture  Setup Time [id number]   Final    Colony Count NO GROWTH   Final    Culture NO GROWTH   Final    Report Status 04/14/2011 FINAL   Final    CXR 4/4 PORTABLE CHEST - 1 VIEW  Comparison: Chest x-ray of 04/12/2011  Findings: There has been some worsening of airspace disease  bilaterally right greater  than left primarily perihilar. This is  most consistent with pulmonary vascular congestion and mild edema  with probable right pleural effusion. Mild cardiomegaly is stable.  IMPRESSION:  Some worsening of airspace disease most suggestive of edema with  probable right effusion.  Medications:    . aspirin  81 mg Oral Daily  . calcium acetate (Phos Binder)  667 mg Oral TID WC  . carvedilol  25 mg Oral BID WC  . Chlorhexidine Gluconate Cloth  6 each Topical q morning - 10a  . feeding supplement (NEPRO CARB STEADY)  237 mL Oral Q1400  . heparin  5,000 Units Subcutaneous Q8H  . isosorbide mononitrate  30 mg Oral Daily  . multivitamin  1 tablet Oral Daily  . mupirocin ointment  1 application Nasal BID  . niacin  250 mg Oral 1 day or 1 dose  . simvastatin  10 mg Oral 1 day or 1 dose  . sodium chloride  3 mL Intravenous Q12H    Assessment/Plan: Principal Problem:  *Uremic encephalopathy Active Problems:  Renal failure (ARF), acute on chronic  Metabolic acidosis  HTN (hypertension)  Hyperlipidemia  Tobacco abuse  Chronic systolic congestive heart failure   Acute on Chronic Renal Failure - Stage V Has now agreed to begin HD - Nephrology is  following - tolerating HD well thus far - BUN is falling and MS appears to be improving in turn outpatient HD arranged already   Uremic encephalopathy Improving w/ HD as noted above   RLL infiltrate? No clinical signs to suggest an active PNA,  Probably due to volume overload abx stopped  HTN Well controlled at present  Chronic systolic CHF EF 35-40% - volume being managed via HD - euvolemic on exam today   Dispo snf placement    Denisa Enterline [phone]

## 2011-04-17 NOTE — Progress Notes (Signed)
Clinical Social Work: Weekend [employer] off list for patient and daughter Manson Allan to review and discuss.  Pt daughter was called due to her not being in hospital and reports I can leave the list in the room and she will come later in the evening to review.  She reports to have someone follow up on Monday, thus will leave note for weekday CSW.  No other needs at this time.    Ashley Jacobs, MSW LCSW [phone]

## 2011-04-17 NOTE — Progress Notes (Signed)
Physical Therapy Treatment Patient Details Name: [name] MRN: [id number] DOB: 1925-06-18 Today's Date: 04/17/2011  PT Assessment/Plan  PT - Assessment/Plan Comments on Treatment Session: Pt is an 78 y/o male admitted from home with recent recurrence of falling.  Pt continues to present as significant falls risk .  Pt will require 24/7 (whenever OOB) supervision for safe return to home. Today pt requiring moderate assist to stand, only able to ambulate very short distance with max encouragement. Discussed need for SNF with pt, unclear whether pt agrees with discharge plan or not. Continues to say "(I'll) think about it."  PT Plan: Discharge plan needs to be updated PT Frequency: Min 3X/week Follow Up Recommendations: Supervision/Assistance - 24 hour;Skilled nursing facility Equipment Recommended: Defer to next venue PT Goals  Acute Rehab PT Goals Pt will go Supine/Side to Sit: Independently;with HOB 0 degrees PT Goal: Supine/Side to Sit - Progress: Progressing toward goal Pt will Sit at [facility] of Bed: Independently;6-10 min;with no upper extremity support PT Goal: Sit at Edge Of Bed - Progress: Progressing toward goal Pt will go Sit to Stand: with modified independence;with upper extremity assist PT Goal: Sit to Stand - Progress: Not progressing Pt will go Stand to Sit: with modified independence;with upper extremity assist PT Goal: Stand to Sit - Progress: Progressing toward goal Pt will Ambulate: 16 - 50 feet;with modified independence;with rolling walker PT Goal: Ambulate - Progress: Progressing toward goal Additional Goals Additional Goal #1: Pt will perform standard balance assessment within safe limits.  PT Goal: Additional Goal #1 - Progress: Progressing toward goal  PT Treatment Precautions/Restrictions  Precautions Precautions: Fall Required Braces or Orthoses: No Restrictions Weight Bearing Restrictions: No Mobility (including Balance) Bed Mobility Bed Mobility:  Yes Supine to Sit: 5: Supervision;HOB flat;With rails Supine to Sit Details (indicate cue type and reason): Pt required extensive effort to complete task even with use of rail. Pt very slow to initiate movements Transfers Transfers: Yes Sit to Stand: From bed;With upper extremity assist;3: Mod assist Sit to Stand Details (indicate cue type and reason): Pt with multiple attempts on own, too weak to complete and required moderate assist to attain full standing position. Verbal cues for safe UE placement Stand to Sit: With upper extremity assist;With armrests;To chair/3-in-1;4: Min assist Stand to Sit Details: Verbal cues for safe UE positioning, Assist to lower safely to chair. Pt initially attempting to sit prior to being square with chair.  Ambulation/Gait Ambulation/Gait: Yes Ambulation/Gait Assistance: 4: Min assist Ambulation/Gait Assistance Details (indicate cue type and reason): Verbal and tactile cues for safe use of RW, pt unsteady in standing. Fatiges quickly, requests to sit and rest after 2 steps. Pt with increased difficulty negotiation objects in room even with verbal cues. Max encouragement required for ambulation Ambulation Distance (Feet): 15 Feet Assistive device: Rolling walker Gait Pattern: Step-through pattern;Decreased stride length;Decreased weight shift to right (minimal foot clearance bilaterally) Stairs: No Wheelchair Mobility Wheelchair Mobility: No  Posture/Postural Control Posture/Postural Control: No significant limitations Balance Balance Assessed: Yes Static Sitting Balance Static Sitting - Balance Support: Feet supported;Bilateral upper extremity supported Static Sitting - Level of Assistance: 5: Stand by assistance Static Sitting - Comment/# of Minutes: No posterior lean today Exercise  Total Joint Exercises Ankle Circles/Pumps: AROM;Both;10 reps;Seated Long Arc Quad: AROM;Both;20 reps;Seated Other Exercises Other Exercises: Marching while seated 2 x 10  reps. End of Session PT - End of Session Equipment Utilized During Treatment: Gait belt Activity Tolerance: Patient limited by fatigue Patient left: in chair;with call bell in  reach;Other (comment) (chair alarm set) Nurse Communication: Mobility status for transfers;Mobility status for ambulation General Behavior During Session: [facility] for tasks performed Cognition: Impaired Cognitive Impairment: Slow to process, hard to analyze as not very verbal. Pt not certain he needs SNF indicating decreased awareness of deficits.  Sherrine Maples Cheek 04/17/2011, 4:02 PM  Sherie Don) Carleene Mains PT, DPT Acute Rehabilitation [phone]

## 2011-04-18 LAB — GLUCOSE, CAPILLARY: Glucose-Capillary: 109 mg/dL — ABNORMAL HIGH (ref 70–99)

## 2011-04-18 MED ORDER — HEPARIN SODIUM (PORCINE) 1000 UNIT/ML DIALYSIS
20.0000 [IU]/kg | INTRAMUSCULAR | Status: DC | PRN
  Administered 2011-04-19: 1200 [IU] via INTRAVENOUS_CENTRAL
  Filled 2011-04-18: qty 2

## 2011-04-18 MED ORDER — CARVEDILOL 12.5 MG PO TABS
12.5000 mg | ORAL_TABLET | Freq: Two times a day (BID) | ORAL | Status: DC
  Administered 2011-04-18 – 2011-04-19 (×3): 12.5 mg via ORAL
  Filled 2011-04-18 (×4): qty 1

## 2011-04-18 NOTE — Progress Notes (Signed)
Narrative: 78 y/o male who presented for weakness, falls, confusion and pedal edema. He has CKD and follows with Dr Allena Katz as outpt. He was found to have worsening of his renal function, pulmonary edema, uremia, acidosis. He has a graft in LUE.  Started on HD during this admission  No new events   Objective: Weight change: -3 kg (-6 lb 9.8 oz)  Intake/Output Summary (Last 24 hours) at 04/18/11 1128 Last data filed at 04/18/11 0900  Gross per 24 hour  Intake    840 ml  Output      0 ml  Net    840 ml   Blood pressure 106/60, pulse 74, temperature 98.2 F (36.8 C), temperature source Oral, resp. rate 20, height 6' (1.829 m), weight 57.6 kg (126 lb 15.8 oz), SpO2 95.00%. Patient Vitals for the past 24 hrs:  BP Temp Temp src Pulse Resp SpO2 Height Weight  04/18/11 0910 106/60 mmHg 98.2 F (36.8 C) Oral 74  20  95 % - -  04/18/11 0837 123/62 mmHg 98.6 F (37 C) Oral 75  20  98 % - -  04/18/11 0459 118/64 mmHg 98.2 F (36.8 C) Oral 75  18  97 % - -  04/17/11 2102 122/58 mmHg 98.6 F (37 C) Oral 82  21  97 % 6' (1.829 m) 57.6 kg (126 lb 15.8 oz)  04/17/11 1715 117/56 mmHg 98.2 F (36.8 C) Oral 77  18  99 % - -  04/17/11 1400 116/54 mmHg 98.7 F (37.1 C) Oral 75  20  99 % - -   Physical Exam: arousable verbal  Not willing to have a conversation CVS: RRR RS: CTAB  Lab Results:  [facility] 04/16/11 0733  NA 138  K 3.5  CL 104  CO2 17*  GLUCOSE 98  BUN 62*  CREATININE 6.00*  CALCIUM 6.5*  MG --  PHOS 5.3*    Basename 04/16/11 0733  AST --  ALT --  ALKPHOS --  BILITOT --  PROT --  ALBUMIN 2.1*    Basename 04/16/11 0733  WBC 12.4*  NEUTROABS --  HGB 10.6*  HCT 30.9*  MCV 90.1  PLT 169   No results found for this basename: CKTOTAL:3,CKMB:3,CKMBINDEX:3,TROPONINI:3 in the last 72 hours  Micro Results: Recent Results (from the past 240 hour(s))  MRSA PCR SCREENING     Status: Abnormal   Collection Time   04/13/11  2:58 AM      Component Value Range Status  Comment   MRSA by PCR POSITIVE (*) NEGATIVE  Final   URINE CULTURE     Status: Normal   Collection Time   04/13/11  4:30 AM      Component Value Range Status Comment   Specimen Description URINE, CATHETERIZED   Final    Special Requests NONE   Final    Culture  Setup Time [id number]   Final    Colony Count NO GROWTH   Final    Culture NO GROWTH   Final    Report Status 04/14/2011 FINAL   Final   URINE CULTURE     Status: Normal   Collection Time   04/13/11  3:52 PM      Component Value Range Status Comment   Specimen Description URINE, CATHETERIZED   Final    Special Requests NONE   Final    Culture  Setup Time [id number]   Final    Colony Count NO GROWTH   Final    Culture NO  GROWTH   Final    Report Status 04/14/2011 FINAL   Final    CXR 4/4 PORTABLE CHEST - 1 VIEW  Comparison: Chest x-ray of 04/12/2011  Findings: There has been some worsening of airspace disease  bilaterally right greater than left primarily perihilar. This is  most consistent with pulmonary vascular congestion and mild edema  with probable right pleural effusion. Mild cardiomegaly is stable.  IMPRESSION:  Some worsening of airspace disease most suggestive of edema with  probable right effusion.  Medications:    . aspirin  81 mg Oral Daily  . calcium acetate (Phos Binder)  667 mg Oral TID WC  . carvedilol  25 mg Oral BID WC  . Chlorhexidine Gluconate Cloth  6 each Topical q morning - 10a  . feeding supplement (NEPRO CARB STEADY)  237 mL Oral Q1400  . heparin  5,000 Units Subcutaneous Q8H  . isosorbide mononitrate  30 mg Oral Daily  . multivitamin  1 tablet Oral Daily  . mupirocin ointment  1 application Nasal BID  . niacin  250 mg Oral 1 day or 1 dose  . simvastatin  10 mg Oral 1 day or 1 dose  . sodium chloride  3 mL Intravenous Q12H    Assessment/Plan: Principal Problem:  *Uremic encephalopathy Active Problems:  Renal failure (ARF), acute on chronic  Metabolic acidosis  HTN  (hypertension)  Hyperlipidemia  Tobacco abuse  Chronic systolic congestive heart failure   Acute on Chronic Renal Failure - Stage V Has now agreed to begin HD - Nephrology is following - tolerating HD well thus far - BUN is falling and MS appears to be improving in turn outpatient HD arranged already   Uremic encephalopathy Improving w/ HD as noted above   RLL infiltrate? No clinical signs to suggest an active PNA,  Probably due to volume overload abx stopped  HTN Well controlled at present  Chronic systolic CHF EF 35-40% - volume being managed via HD - euvolemic on exam today   Dispo snf placement    Vandora Jaskulski [phone]

## 2011-04-18 NOTE — Progress Notes (Signed)
  [facility] Progress Note   Assessment:  1 New Start ESRD s/p Uremic Syndrome (acidosis, volume overload, mental status changes), improved  2 Volume Overload, clinically better  Plan:  On MWF [facility] schedule as outpatient. SNF plan is very appropriate.  Next HD in AM Monday. Reduce Coreg dosage.  Subjective:   Eating lunch   Objective:   BP 106/60  Pulse 74  Temp(Src) 98.2 F (36.8 C) (Oral)  Resp 20  Ht 6' (1.829 m)  Wt 57.6 kg (126 lb 15.8 oz)  BMI 17.22 kg/m2  SpO2 95%  Intake/Output Summary (Last 24 hours) at 04/18/11 1252 Last data filed at 04/18/11 0900  Gross per 24 hour  Intake    840 ml  Output      0 ml  Net    840 ml   Weight change: -3 kg (-6 lb 9.8 oz)  Physical Exam: BMW:UXLKG to me Resp:comfortable MWN:UUVO Ext:no edema no Skin scaly  Imaging: No results found.  Labs: BMET  Lab 04/16/11 0733 04/14/11 0928 04/13/11 1307 04/13/11 0007 04/12/11 1842  NA 138 144 146* -- 143  K 3.5 3.3* 4.0 -- 4.2  CL 104 110 113* -- 113*  CO2 17* 17* 14* -- 9*  GLUCOSE 98 74 115* -- 104*  BUN 62* 70* 95* -- 95*  CREATININE 6.00* 6.61* 8.13* 7.28* 8.28*  ALB -- -- -- -- --  CALCIUM 6.5* 7.1* 6.5* -- 6.8*  PHOS 5.3* 6.2* 8.7* -- --   CBC  Lab 04/16/11 0733 04/13/11 1307 04/13/11 0200 04/12/11 1842  WBC 12.4* 7.5 8.5 7.8  NEUTROABS -- -- -- 6.6  HGB 10.6* 11.4* 11.5* 13.9  HCT 30.9* 34.0* 34.1* 41.8  MCV 90.1 91.6 91.9 94.6  PLT 169 152 151 161    Medications:      . aspirin  81 mg Oral Daily  . calcium acetate (Phos Binder)  667 mg Oral TID WC  . carvedilol  25 mg Oral BID WC  . Chlorhexidine Gluconate Cloth  6 each Topical q morning - 10a  . feeding supplement (NEPRO CARB STEADY)  237 mL Oral Q1400  . heparin  5,000 Units Subcutaneous Q8H  . isosorbide mononitrate  30 mg Oral Daily  . multivitamin  1 tablet Oral Daily  . mupirocin ointment  1 application Nasal BID  . niacin  250 mg Oral 1 day or 1 dose  . simvastatin  10 mg  Oral 1 day or 1 dose  . sodium chloride  3 mL Intravenous Q12H    04/18/2011, 12:52 PM

## 2011-04-19 ENCOUNTER — Inpatient Hospital Stay (HOSPITAL_COMMUNITY): Payer: Medicare Other

## 2011-04-19 DIAGNOSIS — R5381 Other malaise: Secondary | ICD-10-CM

## 2011-04-19 DIAGNOSIS — G92 Toxic encephalopathy: Secondary | ICD-10-CM

## 2011-04-19 LAB — IRON AND TIBC
Iron: 10 ug/dL — ABNORMAL LOW (ref 42–135)
TIBC: 125 ug/dL — ABNORMAL LOW (ref 215–435)

## 2011-04-19 LAB — FERRITIN: Ferritin: 515 ng/mL — ABNORMAL HIGH (ref 22–322)

## 2011-04-19 MED ORDER — RENA-VITE PO TABS: 1.0000 | ORAL_TABLET | Freq: Every day | ORAL | Status: DC

## 2011-04-19 MED ORDER — NEPRO/CARBSTEADY PO LIQD: 237.0000 mL | Freq: Every day | ORAL | Status: DC

## 2011-04-19 NOTE — Progress Notes (Signed)
CSW spoke with patient who stated CSW could contact dtr regarding SNF placement. CSW called dtr Manson Allan) who stated she did receive bed offers over the weekend but had not made any phone calls to choose a facility. CSW explained that patient was ready to dc. Dtr agreed to make a decision and call CSW back after lunch with decision. CSW will continue to follow. [place], [place] [phone]

## 2011-04-19 NOTE — Progress Notes (Signed)
Rehab admissions - Evaluated for possible admission.  Please see rehab consult done by Dr. Riley Kill today recommending SNF.  Agree with need for SNF level therapies.  Pager [phone]

## 2011-04-19 NOTE — Consult Note (Signed)
Physical Medicine and Rehabilitation Consult Reason for Consult: Deconditioning Referring Phsyician:  Dr. Lavera Guise    HPI: [name] is an 78 y.o. malewith known history of CK D. stage 4-5 with creatinine usually around 6, hypertension, CHF, BPH, hyperlipidemia he was brought in the ER  04/01 because patient was feeling increasingly weak with increasing swelling of the lower extremities and frequent falls. In addition the patient was noticed to confused and patient's home health aide who usually takes care of him brought him to the ER. In the ER patient was found to have a creatinine of 8 with a bicarbonate of 9. Patient has always refused dialysis. Patient noted to be uremic with encephalopathy but did agree to initiate HD. Volume overload improving.  Patient has history of recent falls.  PT evaluation done on 04/05 and patient noted to be significantly deconditioned.  MD, PT recommending CIR.  Review of Systems  Unable to perform ROS: mental acuity  Respiratory: Negative for shortness of breath.   Cardiovascular: Negative for chest pain.   Past Medical History  Diagnosis Date  . Hypertension   . Chronic kidney disease   . CHF (congestive heart failure)   . BPH (benign prostatic hyperplasia)   . Prostate cancer    Past Surgical History  Procedure Date  . Prostate biopsy    History reviewed. No pertinent family history.  Social History:  Lives alone.  Has an aide 5 hours/ 7 days. He reports that he has been smoking Cigarettes.  He has smoked for the past 60 years. He does not have any smokeless tobacco history on file. His alcohol and drug histories not on file. Daughter assists prn.   No Known Allergies  Prior to Admission medications   Medication Sig Start Date End Date Taking? Authorizing Provider  amLODipine (NORVASC) 10 MG tablet Take 10 mg by mouth daily.   Yes Historical Provider, MD  aspirin 81 MG tablet Take 81 mg by mouth daily.   Yes Historical Provider, MD  calcium  acetate, Phos Binder, (PHOSLYRA) 667 MG/5ML SOLN Take by mouth 3 (three) times daily with meals.   Yes Historical Provider, MD  carvedilol (COREG) 25 MG tablet Take 25 mg by mouth 2 (two) times daily with a meal.   Yes Historical Provider, MD  cloNIDine (CATAPRES) 0.2 MG tablet Take 0.2 mg by mouth 2 (two) times daily.   Yes Historical Provider, MD  ferrous sulfate dried (SLOW FE) 160 (50 FE) MG TBCR Take 160 mg by mouth daily.   Yes Historical Provider, MD  furosemide (LASIX) 80 MG tablet Take 160 mg by mouth 2 (two) times daily.    Yes Historical Provider, MD  Boris Lown Oil 300 MG CAPS Take 1 capsule by mouth daily.   Yes Historical Provider, MD  niacin 250 MG tablet Take 250 mg by mouth 1 day or 1 dose.   Yes Historical Provider, MD  simvastatin (ZOCOR) 10 MG tablet Take 10 mg by mouth 1 day or 1 dose.   Yes Historical Provider, MD  sodium bicarbonate 650 MG tablet Take 650 mg by mouth 2 (two) times daily.   Yes Historical Provider, MD   Scheduled Medications:    . aspirin  81 mg Oral Daily  . calcium acetate (Phos Binder)  667 mg Oral TID WC  . carvedilol  12.5 mg Oral BID WC  . Chlorhexidine Gluconate Cloth  6 each Topical q morning - 10a  . feeding supplement (NEPRO CARB STEADY)  237 mL Oral Q1400  .  heparin  5,000 Units Subcutaneous Q8H  . isosorbide mononitrate  30 mg Oral Daily  . multivitamin  1 tablet Oral Daily  . mupirocin ointment  1 application Nasal BID  . niacin  250 mg Oral 1 day or 1 dose  . simvastatin  10 mg Oral 1 day or 1 dose  . sodium chloride  3 mL Intravenous Q12H  . DISCONTD: carvedilol  25 mg Oral BID WC   PRN MED's: acetaminophen, acetaminophen, heparin, heparin, lanolin, ondansetron (ZOFRAN) IV, ondansetron Home: Home Living Lives With: Alone Receives Help From: Personal care attendant Type of Home: House Home Layout: One level Home Access: Level entry Bathroom Shower/Tub: Walk-in shower;Door [employer] Toilet: Standard Bathroom Accessibility: Yes How  Accessible: Accessible via walker Home Adaptive Equipment: Built-in shower seat;Walker - rolling  Functional History: Prior Function Level of Independence: Independent with gait;Needs assistance with ADLs;Independent with transfers;Requires assistive device for independence Able to Take Stairs?: No Driving: Yes Vocation: Retired Leisure: Hobbies-yes (Comment) Functional Status:  Mobility: Bed Mobility Bed Mobility: Yes Supine to Sit: 5: Supervision;HOB flat;With rails Supine to Sit Details (indicate cue type and reason): Pt required extensive effort to complete task even with use of rail. Pt very slow to initiate movements Transfers Transfers: Yes Sit to Stand: From bed;With upper extremity assist;3: Mod assist Sit to Stand Details (indicate cue type and reason): Pt with multiple attempts on own, too weak to complete and required moderate assist to attain full standing position. Verbal cues for safe UE placement Stand to Sit: With upper extremity assist;With armrests;To chair/3-in-1;4: Min assist Stand to Sit Details: Verbal cues for safe UE positioning, Assist to lower safely to chair. Pt initially attempting to sit prior to being square with chair.  Ambulation/Gait Ambulation/Gait: Yes Ambulation/Gait Assistance: 4: Min assist Ambulation/Gait Assistance Details (indicate cue type and reason): Verbal and tactile cues for safe use of RW, pt unsteady in standing. Fatiges quickly, requests to sit and rest after 2 steps. Pt with increased difficulty negotiation objects in room even with verbal cues. Max encouragement required for ambulation Ambulation Distance (Feet): 15 Feet Assistive device: Rolling walker Gait Pattern: Step-through pattern;Decreased stride length;Decreased weight shift to right (minimal foot clearance bilaterally) Stairs: No Wheelchair Mobility Wheelchair Mobility: No  ADL: ADL Eating/Feeding: Simulated;Independent Where Assessed - Eating/Feeding: Chair Grooming:  Simulated;Set up Where Assessed - Grooming: Sitting, chair;Supported Upper Body Bathing: Simulated;Set up Where Assessed - Upper Body Bathing: Sitting, chair;Supported Lower Body Bathing: Simulated;Moderate assistance Where Assessed - Lower Body Bathing: Sit to stand from chair Upper Body Dressing: Simulated;Set up Where Assessed - Upper Body Dressing: Sitting, chair;Supported Lower Body Dressing: Simulated;Maximal assistance Where Assessed - Lower Body Dressing: Sit to stand from chair Toilet Transfer: Simulated;Minimal assistance Toilet Transfer Details (indicate cue type and reason): min cues for safety--holding walker during turn Toilet Transfer Method: Stand pivot Toilet Transfer Equipment:  (chair to bed to another bedside chair) Toileting - Clothing Manipulation: Simulated;Minimal assistance Where Assessed - Toileting Clothing Manipulation: Sit on 3-in-1 or toilet Toileting - Hygiene: Minimal assistance;Simulated Where Assessed - Toileting Hygiene: Sit on 3-in-1 or toilet Tub/Shower Transfer: Not assessed Equipment Used: Rolling walker Ambulation Related to ADLs: transfers only ADL Comments: pt had no ADLs needs but simulated activities.  He changed his mind walking to bathroom and sat on bed instead.  Friend came to cut hair so I assisted him to another bedside chair.  Cognition: Cognition Arousal/Alertness: Awake/alert Orientation Level: Oriented to person;Oriented to place Cognition Arousal/Alertness: Awake/alert Overall Cognitive Status: Difficult to assess (  annoyed with questions.  Followed commands but changed plan ) Orientation Level: Oriented to person;Oriented to place  Blood pressure 128/62, pulse 75, temperature 97.8 F (36.6 C), temperature source Oral, resp. rate 18, height 6' (1.829 m), weight 58.06 kg (128 lb), SpO2 96.00%. Physical Exam  Constitutional: He appears well-developed and well-nourished.  HENT:  Head: Normocephalic and atraumatic.  Neck: Normal  range of motion. Neck supple.  Cardiovascular: Normal rate and regular rhythm.   Pulmonary/Chest: Effort normal and breath sounds normal.  Abdominal: Soft.  Musculoskeletal:       Right knee: He exhibits decreased range of motion.       Left knee: He exhibits decreased range of motion.  Neurological: He is alert. A sensory deficit is present. No cranial nerve deficit.  Reflex Scores:      Tricep reflexes are 1+ on the right side and 1+ on the left side.      Bicep reflexes are 1+ on the right side and 1+ on the left side.      Brachioradialis reflexes are 1+ on the right side and 1+ on the left side.      Patellar reflexes are 1+ on the right side and 1+ on the left side.      Achilles reflexes are 1+ on the right side and 1+ on the left side.      Patient with generalized weakness in all 4 limbs. He stocking glove sensory loss in both legs. Cognitively he is alert but fairly quiet. His limited insight and awareness. Does have short-term memory deficits.  Skin: Abrasion (left knee) noted.  Psychiatric: His affect is blunt. He is slowed. He exhibits abnormal recent memory and abnormal remote memory.    Results for orders placed during the hospital encounter of 04/12/11 (from the past 24 hour(s))  GLUCOSE, CAPILLARY     Status: Abnormal   Collection Time   04/18/11  9:21 PM      Component Value Range   Glucose-Capillary 109 (*) 70 - 99 (mg/dL)   Comment 1 Notify RN     No results found.  Assessment/Plan: Diagnosis: Gait disorder and history of falls with acute renal failure 1. Does the need for close, 24 hr/day medical supervision in concert with the patient's rehab needs make it unreasonable for this patient to be served in a less intensive setting? No 2. Co-Morbidities requiring supervision/potential complications: See above 3. Due to bowel management, safety, skin/wound care, disease management, medication administration and pain management, does the patient require 24 hr/day rehab  nursing? No 4. Does the patient require coordinated care of a physician, rehab nurse, PT and OT to address physical and functional deficits in the context of the above medical diagnosis(es)? No Addressing deficits in the following areas: balance, endurance, locomotion, strength, transferring, bowel/bladder control, bathing, dressing, feeding, grooming, toileting and cognition 5. Can the patient actively participate in an intensive therapy program of at least 3 hrs of therapy per day at least 5 days per week? No 6. The potential for patient to make measurable gains while on inpatient rehab is fair 7. Anticipated functional outcomes upon discharge from inpatients are not applicable 8. Estimated rehab length of stay to reach the above functional goals is: Not applicable 9. Does the patient have adequate social supports to accommodate these discharge functional goals? No 10. Anticipated D/C setting: Home 11. Anticipated post D/C treatments: HH therapy 12. Overall Rehab/Functional Prognosis: fair  RECOMMENDATIONS: This patient's condition is appropriate for continued rehabilitative care in the  following setting: SNF Patient has agreed to participate in recommended program. Yes and Potentially Note that insurance prior authorization may be required for reimbursement for recommended care.  Comment: I had an opportunity to review this patient's chart in detail. I spoke with Dr. Lavera Guise as well. The patient has acute on chronic gait deficits and weakness. His daughter works. The most appropriate rehab venue at this point given his cognitive issues and carryover is a skilled nursing facility.   Ranelle Oyster M.D. 04/19/2011

## 2011-04-19 NOTE — Progress Notes (Signed)
PT Cancellation Note  Treatment cancelled today due to pt at HD. Will f/u tomorrow. Humberto Seals HELEN 04/19/2011, 4:25 PM

## 2011-04-19 NOTE — Progress Notes (Signed)
CSW spoke with dtr who reported they chose [facility]. CSW text paged MD this information. CSW will continue to follow and assist with dc planning. [place], [place] [phone]

## 2011-04-19 NOTE — Progress Notes (Signed)
CSW spoke with CIR who stated patient needed SNF. CSW spoke with SNF who received dc summary. CSW prepared dc packet. Dtr will transport patient around 6:30 pm to SNF. SNF, RN, patient and family all agreeable to dc. CSW informed RN Harriett Sine) of family arriving at 6:30 pm for patient. CSW is signing off but available if needed. [place], [place] [phone]

## 2011-04-19 NOTE — Progress Notes (Signed)
Pt ready for DC. Iv removed. ;Pt remains stable. DC packet given to daughter. Daughter to transport pt to [place]. Pt escorted via wheel chair by nurse tech to short stay. [place], Rosanna Randy

## 2011-04-19 NOTE — Progress Notes (Signed)
Narrative: 78 y/o male who presented for weakness, falls, confusion and pedal edema. He has CKD and follows with Dr Allena Katz as outpt. He was found to have worsening of his renal function, pulmonary edema, uremia, acidosis. He has a graft in LUE.  Started on HD during this admission  No new events   Objective: Weight change: 0.46 kg (1 lb 0.2 oz)  Intake/Output Summary (Last 24 hours) at 04/19/11 0802 Last data filed at 04/18/11 2306  Gross per 24 hour  Intake    540 ml  Output      0 ml  Net    540 ml   Blood pressure 128/62, pulse 75, temperature 97.8 F (36.6 C), temperature source Oral, resp. rate 18, height 6' (1.829 m), weight 58.06 kg (128 lb), SpO2 96.00%. Patient Vitals for the past 24 hrs:  BP Temp Temp src Pulse Resp SpO2 Weight  04/19/11 0543 128/62 mmHg 97.8 F (36.6 C) Oral 75  18  96 % -  04/18/11 2124 145/64 mmHg 97.9 F (36.6 C) Oral 60  18  98 % 58.06 kg (128 lb)  04/18/11 1800 132/54 mmHg 98.2 F (36.8 C) Oral 78  18  95 % -  04/18/11 1400 130/64 mmHg 98.6 F (37 C) Oral 71  18  95 % -  04/18/11 0910 106/60 mmHg 98.2 F (36.8 C) Oral 74  20  95 % -  04/18/11 0837 123/62 mmHg 98.6 F (37 C) Oral 75  20  98 % -    Lab Results: No results found for this basename: NA:3,K:3,CL:3,CO2:3,GLUCOSE:3,BUN:3,CREATININE:3,CALCIUM:3,MG:3,PHOS:3 in the last 72 hours No results found for this basename: AST:2,ALT:2,ALKPHOS:2,BILITOT:2,PROT:2,ALBUMIN:2 in the last 72 hours No results found for this basename: WBC:3,NEUTROABS:3,HGB:3,HCT:3,MCV:3,PLT:3 in the last 72 hours No results found for this basename: CKTOTAL:3,CKMB:3,CKMBINDEX:3,TROPONINI:3 in the last 72 hours  Micro Results: Recent Results (from the past 240 hour(s))  MRSA PCR SCREENING     Status: Abnormal   Collection Time   04/13/11  2:58 AM      Component Value Range Status Comment   MRSA by PCR POSITIVE (*) NEGATIVE  Final   URINE CULTURE     Status: Normal   Collection Time   04/13/11  4:30 AM      Component  Value Range Status Comment   Specimen Description URINE, CATHETERIZED   Final    Special Requests NONE   Final    Culture  Setup Time [id number]   Final    Colony Count NO GROWTH   Final    Culture NO GROWTH   Final    Report Status 04/14/2011 FINAL   Final   URINE CULTURE     Status: Normal   Collection Time   04/13/11  3:52 PM      Component Value Range Status Comment   Specimen Description URINE, CATHETERIZED   Final    Special Requests NONE   Final    Culture  Setup Time [id number]   Final    Colony Count NO GROWTH   Final    Culture NO GROWTH   Final    Report Status 04/14/2011 FINAL   Final    CXR 4/4 PORTABLE CHEST - 1 VIEW  Comparison: Chest x-ray of 04/12/2011  Findings: There has been some worsening of airspace disease  bilaterally right greater than left primarily perihilar. This is  most consistent with pulmonary vascular congestion and mild edema  with probable right pleural effusion. Mild cardiomegaly is stable.  IMPRESSION:  Some worsening of  airspace disease most suggestive of edema with  probable right effusion.  Medications:    . aspirin  81 mg Oral Daily  . calcium acetate (Phos Binder)  667 mg Oral TID WC  . carvedilol  12.5 mg Oral BID WC  . Chlorhexidine Gluconate Cloth  6 each Topical q morning - 10a  . feeding supplement (NEPRO CARB STEADY)  237 mL Oral Q1400  . heparin  5,000 Units Subcutaneous Q8H  . isosorbide mononitrate  30 mg Oral Daily  . multivitamin  1 tablet Oral Daily  . mupirocin ointment  1 application Nasal BID  . niacin  250 mg Oral 1 day or 1 dose  . simvastatin  10 mg Oral 1 day or 1 dose  . sodium chloride  3 mL Intravenous Q12H  . DISCONTD: carvedilol  25 mg Oral BID WC    Assessment/Plan: Principal Problem:  *Uremic encephalopathy Active Problems:  Renal failure (ARF), acute on chronic  Metabolic acidosis  HTN (hypertension)  Hyperlipidemia  Tobacco abuse  Chronic systolic congestive heart failure   Acute on  Chronic Renal Failure - Stage V Has now agreed to begin HD - Nephrology is following - tolerating HD well thus far - BUN is falling and MS appears to be improving in turn outpatient HD arranged already   Uremic encephalopathy Improving w/ HD as noted above   RLL infiltrate? No clinical signs to suggest an active PNA,  Probably due to volume overload abx stopped  HTN Well controlled at present  Chronic systolic CHF EF 35-40% - volume being managed via HD - euvolemic on exam today   Dispo snf today   [name] [phone]

## 2011-04-19 NOTE — Progress Notes (Signed)
[facility]  On HD via L upper arm AVF BP-- 131/58 Goal--2L Hgb-- 10.6  K+--3.5 On Phoslyra 5 cc tid No EPO No Vit D No recent Fe studies No recent PTH Will check Fe/TIBC/ferritin and PTH in HD today  May be d/c'd to SNF or may go to rehab today
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
## 2011-04-19 NOTE — Discharge Summary (Signed)
Patient ID: [name] MRN: [id number] DOB/AGE: [birth year] 78 y.o. Primary Care Physician:PATEL,JAY K., MD, MD Admit date: 04/12/2011 Discharge date: 04/19/2011    Discharge Diagnoses:  New onset ESRD Principal Problem:  *Uremic encephalopathy Active Problems:  Renal failure (ARF), acute on chronic  Metabolic acidosis  HTN (hypertension)  Hyperlipidemia  Tobacco abuse  Chronic systolic congestive heart failure   Medication List  As of 04/19/2011  2:23 PM   START taking these medications         feeding supplement (NEPRO CARB STEADY) Liqd   Take 237 mLs by mouth daily at 2 PM daily at 2 PM.      multivitamin Tabs tablet   Take 1 tablet by mouth daily.         CONTINUE taking these medications         aspirin 81 MG tablet      calcium acetate (Phos Binder) 667 MG/5ML Soln   Commonly known as: PHOSLYRA      carvedilol 25 MG tablet   Commonly known as: COREG      niacin 250 MG tablet      simvastatin 10 MG tablet   Commonly known as: ZOCOR         STOP taking these medications         amLODipine 10 MG tablet      cloNIDine 0.2 MG tablet      ferrous sulfate dried 160 (50 FE) MG Tbcr      furosemide 80 MG tablet      Krill Oil 300 MG Caps      sodium bicarbonate 650 MG tablet          Where to get your medications       Information on where to get these meds is not yet available. Ask your nurse or doctor.         feeding supplement (NEPRO CARB STEADY) Liqd   multivitamin Tabs tablet            Discharged Condition:good   Consults:[facility]   Significant Diagnostic Studies: Dg Chest 2 View  04/12/2011  *RADIOLOGY REPORT*  Clinical Data: Generalized weakness.  CHEST - 2 VIEW  Comparison: 02/28/2011  Findings: Chronic lung disease with COPD.  Progression of right lower lobe airspace disease since the prior study may represent pneumonia.  Mild left lower lobe airspace disease is most likely atelectasis.  Decreased lung volume  compared with  the prior study. Small bilateral pleural effusions.  IMPRESSION: COPD.  Progression of right lower lobe airspace disease, possible pneumonia.  Small pleural effusions and mild left lower lobe atelectasis.  Original Report Authenticated By: Camelia Phenes, M.D.   Ct Head Wo Contrast  04/12/2011  *RADIOLOGY REPORT*  Clinical Data: Altered mental status.  CT HEAD WITHOUT CONTRAST  Technique:  Contiguous axial images were obtained from the base of the skull through the vertex without contrast.  Comparison: None  Findings: There is advanced atrophy and ventriculomegaly. Periventricular white matter disease is also noted.  No extra-axial fluid collections.  No CT findings for acute hemispheric infarction or intracranial hemorrhage.  The brainstem and cerebellum are grossly normal.  No acute skull fracture.  There is left-sided maxillary sinus disease which is likely chronic given the slight wall thickening and calcification.  Minimal mucoperiosteal thickening involving the right maxillary sinus.  The mastoid air cells and middle ear cavities are clear.  Vascular calcifications are noted.  Globes are intact.  IMPRESSION:  1.  Atrophy, ventriculomegaly and periventricular white matter disease. 2.  No acute intracranial findings or mass lesions.  Original Report Authenticated By: P. Loralie Champagne, M.D.   Dg Chest Port 1 View  04/15/2011  *RADIOLOGY REPORT*  Clinical Data: Shortness of breath, follow up infiltrate  PORTABLE CHEST - 1 VIEW  Comparison: Chest x-ray of 04/12/2011  Findings: There has been some worsening of airspace disease bilaterally right greater than left primarily perihilar.  This is most consistent with pulmonary vascular congestion and mild edema with probable right pleural effusion.  Mild cardiomegaly is stable.  IMPRESSION: Some worsening of airspace disease most suggestive of edema with probable right effusion.  Original Report Authenticated By: Juline Patch, M.D.    Lab  Results: Results for orders placed during the hospital encounter of 04/12/11 (from the past 48 hour(s))  GLUCOSE, CAPILLARY     Status: Abnormal   Collection Time   04/18/11  9:21 PM      Component Value Range Comment   Glucose-Capillary 109 (*) 70 - 99 (mg/dL)    Comment 1 Notify RN      Recent Results (from the past 240 hour(s))  MRSA PCR SCREENING     Status: Abnormal   Collection Time   04/13/11  2:58 AM      Component Value Range Status Comment   MRSA by PCR POSITIVE (*) NEGATIVE  Final   URINE CULTURE     Status: Normal   Collection Time   04/13/11  4:30 AM      Component Value Range Status Comment   Specimen Description URINE, CATHETERIZED   Final    Special Requests NONE   Final    Culture  Setup Time [id number]   Final    Colony Count NO GROWTH   Final    Culture NO GROWTH   Final    Report Status 04/14/2011 FINAL   Final   URINE CULTURE     Status: Normal   Collection Time   04/13/11  3:52 PM      Component Value Range Status Comment   Specimen Description URINE, CATHETERIZED   Final    Special Requests NONE   Final    Culture  Setup Time [id number]   Final    Colony Count NO GROWTH   Final    Culture NO GROWTH   Final    Report Status 04/14/2011 FINAL   Final      Hospital Course:  Patient is 78 yo man with PMH of CKD (BL Cre 6.0, refused HD), HTN, who presented with weakness, worsening leg swelling and SOB. At his baseline, patient can walk around using cane. In recent 2 weeks, he started having generalized weakness, worsening bilateral leg swelling and SOB. He has refused hemodialysis intially but then he agreed to begin    Uremic encephalopathy on admission  Improved w/ HD as noted above   RLL infiltrate?  No clinical signs to suggest an active PNA,  Probably due to volume overload  abx stopped   HTN  Well controlled at present   Chronic systolic CHF  EF 21-30% - volume being managed via HD - euvolemic   Weak /deconditioned - evaluated by PT/OT/CIR -  felt to be appropriate for SNF   Discharge Exam: Blood pressure 127/61, pulse 76, temperature 97.3 F (36.3 C), temperature source Oral, resp. rate 18, height 6' (1.829 m), weight 58.4 kg (128 lb 12 oz), SpO2 95.00%. Patient Vitals for the past 24 hrs:  BP Temp  Temp src Pulse Resp SpO2 Weight  04/19/11 1430 134/64 mmHg - - 70  - - -  04/19/11 1400 129/60 mmHg - - 70  - - -  04/19/11 1350 127/61 mmHg - - 76  - - -  04/19/11 1335 126/60 mmHg 97.3 F (36.3 C) Oral 75  18  95 % 58.4 kg (128 lb 12 oz)  04/19/11 0949 134/68 mmHg 98.1 F (36.7 C) Oral 80  18  98 % -  04/19/11 0543 128/62 mmHg 97.8 F (36.6 C) Oral 75  18  96 % -  04/18/11 2124 145/64 mmHg 97.9 F (36.6 C) Oral 60  18  98 % 58.06 kg (128 lb)  04/18/11 1800 132/54 mmHg 98.2 F (36.8 C) Oral 78  18  95 % -   Alert and oriented to self Talking very little CVS: RRR RS: ctab   Disposition: To snf   Discharge Orders    Future Orders Please Complete By Expires   Diet renal 60/70-02-12-1198      Increase activity slowly         Follow-up Information    Follow up with Dagoberto Ligas., MD.   Benay Pillow information:   [address] [facility] [place] [address] [phone]          Signed: Lonia Blood 04/19/2011, 2:23 PM

## 2011-04-19 NOTE — Progress Notes (Addendum)
Faxed AVS including Medication Rec with medication frequencies to [facility]. Faxed to [phone]. [place], Rosanna Randy

## 2011-04-20 LAB — PARATHYROID HORMONE, INTACT (NO CA): PTH: 205.2 pg/mL — ABNORMAL HIGH (ref 14.0–72.0)

## 2011-06-30 ENCOUNTER — Other Ambulatory Visit (HOSPITAL_COMMUNITY): Payer: Self-pay | Admitting: Nephrology

## 2011-06-30 DIAGNOSIS — N186 End stage renal disease: Secondary | ICD-10-CM

## 2011-07-06 ENCOUNTER — Ambulatory Visit (HOSPITAL_COMMUNITY): Admission: RE | Admit: 2011-07-06 | Payer: Medicare Other | Source: Ambulatory Visit

## 2011-07-13 ENCOUNTER — Ambulatory Visit (HOSPITAL_COMMUNITY)
Admission: RE | Admit: 2011-07-13 | Discharge: 2011-07-13 | Disposition: A | Discharge status: Home / Self Care | Payer: Medicare Other | Source: Ambulatory Visit | Attending: Nephrology | Admitting: Nephrology

## 2011-07-13 ENCOUNTER — Other Ambulatory Visit (HOSPITAL_COMMUNITY): Payer: Self-pay | Admitting: Nephrology

## 2011-07-13 DIAGNOSIS — C61 Malignant neoplasm of prostate: Secondary | ICD-10-CM | POA: Insufficient documentation

## 2011-07-13 DIAGNOSIS — N186 End stage renal disease: Secondary | ICD-10-CM

## 2011-07-13 DIAGNOSIS — I120 Hypertensive chronic kidney disease with stage 5 chronic kidney disease or end stage renal disease: Secondary | ICD-10-CM | POA: Insufficient documentation

## 2011-07-13 DIAGNOSIS — T82898A Other specified complication of vascular prosthetic devices, implants and grafts, initial encounter: Secondary | ICD-10-CM | POA: Insufficient documentation

## 2011-07-13 DIAGNOSIS — I871 Compression of vein: Secondary | ICD-10-CM | POA: Insufficient documentation

## 2011-07-13 DIAGNOSIS — Y832 Surgical operation with anastomosis, bypass or graft as the cause of abnormal reaction of the patient, or of later complication, without mention of misadventure at the time of the procedure: Secondary | ICD-10-CM | POA: Insufficient documentation

## 2011-07-13 DIAGNOSIS — N400 Enlarged prostate without lower urinary tract symptoms: Secondary | ICD-10-CM | POA: Insufficient documentation

## 2011-07-13 DIAGNOSIS — I509 Heart failure, unspecified: Secondary | ICD-10-CM | POA: Insufficient documentation

## 2011-07-13 MED ORDER — IOHEXOL 300 MG/ML  SOLN
100.0000 mL | Freq: Once | INTRAMUSCULAR | Status: AC | PRN
  Administered 2011-07-13: 50 mL via INTRAVENOUS

## 2011-07-13 NOTE — Procedures (Signed)
PTA venous 7 mm No comp 

## 2011-07-13 NOTE — H&P (Signed)
[name] is an 78 y.o. male.   Chief Complaint: Decreased access flow HPI: Decreased access flow  Past Medical History  Diagnosis Date  . Hypertension   . Chronic kidney disease   . CHF (congestive heart failure)   . BPH (benign prostatic hyperplasia)   . Prostate cancer     Past Surgical History  Procedure Date  . Prostate biopsy     No family history on file. Social History:  reports that he has been smoking Cigarettes.  He has smoked for the past 60 years. He does not have any smokeless tobacco history on file. His alcohol and drug histories not on file.  Allergies: No Known Allergies  See med list  ROS  There were no vitals taken for this visit. Physical Exam  Constitutional: He is oriented to person, place, and time. He appears well-developed and well-nourished. No distress.  HENT:  Head: Normocephalic.  Neck: Normal range of motion.  Cardiovascular: Normal rate and regular rhythm.   Respiratory: Effort normal and breath sounds normal.  GI: Soft.  Neurological: He is alert and oriented to person, place, and time.  Skin: Skin is warm and dry.     Assessment/Plan PTA venous  Diahann Guajardo,ART A 07/13/2011, 1:27 PM

## 2011-07-14 ENCOUNTER — Telehealth (HOSPITAL_COMMUNITY): Payer: Self-pay | Admitting: *Deleted

## 2011-09-16 ENCOUNTER — Other Ambulatory Visit (HOSPITAL_COMMUNITY): Payer: Self-pay | Admitting: Nephrology

## 2011-09-16 DIAGNOSIS — N186 End stage renal disease: Secondary | ICD-10-CM

## 2011-09-21 ENCOUNTER — Ambulatory Visit (HOSPITAL_COMMUNITY): Payer: Medicare Other

## 2011-09-28 ENCOUNTER — Ambulatory Visit (HOSPITAL_COMMUNITY): Admission: RE | Admit: 2011-09-28 | Payer: Medicare Other | Source: Ambulatory Visit

## 2011-09-28 ENCOUNTER — Ambulatory Visit (HOSPITAL_COMMUNITY)
Admission: RE | Admit: 2011-09-28 | Discharge: 2011-09-28 | Disposition: A | Discharge status: Home / Self Care | Payer: Medicare Other | Source: Ambulatory Visit | Attending: Nephrology | Admitting: Nephrology

## 2011-09-28 ENCOUNTER — Other Ambulatory Visit (HOSPITAL_COMMUNITY): Payer: Self-pay | Admitting: Nephrology

## 2011-09-28 ENCOUNTER — Encounter (HOSPITAL_COMMUNITY): Payer: Self-pay

## 2011-09-28 VITALS — BP 127/62 | HR 65 | Resp 16

## 2011-09-28 DIAGNOSIS — Z79899 Other long term (current) drug therapy: Secondary | ICD-10-CM | POA: Insufficient documentation

## 2011-09-28 DIAGNOSIS — N400 Enlarged prostate without lower urinary tract symptoms: Secondary | ICD-10-CM | POA: Insufficient documentation

## 2011-09-28 DIAGNOSIS — T82898A Other specified complication of vascular prosthetic devices, implants and grafts, initial encounter: Secondary | ICD-10-CM | POA: Insufficient documentation

## 2011-09-28 DIAGNOSIS — I509 Heart failure, unspecified: Secondary | ICD-10-CM | POA: Insufficient documentation

## 2011-09-28 DIAGNOSIS — C61 Malignant neoplasm of prostate: Secondary | ICD-10-CM | POA: Insufficient documentation

## 2011-09-28 DIAGNOSIS — I120 Hypertensive chronic kidney disease with stage 5 chronic kidney disease or end stage renal disease: Secondary | ICD-10-CM | POA: Insufficient documentation

## 2011-09-28 DIAGNOSIS — Y841 Kidney dialysis as the cause of abnormal reaction of the patient, or of later complication, without mention of misadventure at the time of the procedure: Secondary | ICD-10-CM | POA: Insufficient documentation

## 2011-09-28 DIAGNOSIS — N186 End stage renal disease: Secondary | ICD-10-CM

## 2011-09-28 DIAGNOSIS — Z452 Encounter for adjustment and management of vascular access device: Secondary | ICD-10-CM | POA: Insufficient documentation

## 2011-09-28 DIAGNOSIS — I871 Compression of vein: Secondary | ICD-10-CM | POA: Insufficient documentation

## 2011-09-28 DIAGNOSIS — Z7982 Long term (current) use of aspirin: Secondary | ICD-10-CM | POA: Insufficient documentation

## 2011-09-28 MED ORDER — LIDOCAINE HCL 1 % IJ SOLN
INTRAMUSCULAR | Status: AC
  Filled 2011-09-28: qty 20

## 2011-09-28 MED ORDER — IOHEXOL 300 MG/ML  SOLN: 54.0000 mL | Freq: Once | INTRAMUSCULAR | Status: AC | PRN

## 2011-09-28 NOTE — Procedures (Signed)
Shuntogram, venous PTA No complication No blood loss. See complete dictation in [facility] PACS.  

## 2011-09-28 NOTE — H&P (Signed)
Chief Complaint: Poor access flows from (L)UE AVG Referring Physician:Coladonato HPI: [name] is an 78 y.o. male with ESRD who has a (L)UE AVG. At his HD treatments, he has had some poor access flows. He is referred for Shuntogram with possible intervention. His last Shuntogram, which resulted in PTA, was 07/13/11 He reports no changes to his PMHx since his last procedure. No new meds No recent illnesses  Past Medical History:  Past Medical History  Diagnosis Date  . Hypertension   . Chronic kidney disease   . CHF (congestive heart failure)   . BPH (benign prostatic hyperplasia)   . Prostate cancer     Past Surgical History:  Past Surgical History  Procedure Date  . Prostate biopsy     Family History: No family history on file.  Social History:  reports that he has been smoking Cigarettes.  He has smoked for the past 60 years. He does not have any smokeless tobacco history on file. His alcohol and drug histories not on file.  Allergies: No Known Allergies  Medications: ASA 81mg  daily Coreg 25mg  bid Rena-Vit daily Niacin 250 daily Zocor 10mg  daily  Please HPI for pertinent positives, otherwise complete 10 system ROS negative.  Physical Exam: There were no vitals taken for this visit. There is no height or weight on file to calculate BMI.   General Appearance:  Alert, cooperative, no distress, appears stated age  Head:  Normocephalic, without obvious abnormality, atraumatic  ENT: Unremarkable  Neck: Supple, symmetrical, trachea midline, no adenopathy, thyroid: not enlarged, symmetric, no tenderness/mass/nodules  Lungs:   Clear to auscultation bilaterally, no w/r/r, respirations unlabored without use of accessory muscles.  Heart:  Regular rate and rhythm, S1, S2 normal, no murmur, rub or gallop. Carotids 2+ without bruit.  Abdomen:   Soft, non-tender, non distended. Bowel sounds active all four quadrants,  no masses, no organomegaly.  Extremities: (L)UE AVG graft  palpable with good pulses/thrill, no edema  Neurologic: Normal affect, no gross deficits.   No results found for this or any previous visit (from the past 48 hour(s)). No results found.  Assessment/Plan (L)UE AVG with stenosis after diagnostic shuntogram Will proceed with angioplasty as treatment for stenosis Discussed procedure with pt, including risks and complications. Consent signed in chart.  Brayton El PA-C 09/28/2011, 3:14 PM

## 2011-12-31 ENCOUNTER — Encounter: Payer: Self-pay | Admitting: Vascular Surgery

## 2011-12-31 ENCOUNTER — Other Ambulatory Visit: Payer: Self-pay

## 2011-12-31 DIAGNOSIS — T82598A Other mechanical complication of other cardiac and vascular devices and implants, initial encounter: Secondary | ICD-10-CM

## 2011-12-31 DIAGNOSIS — N186 End stage renal disease: Secondary | ICD-10-CM

## 2011-12-31 DIAGNOSIS — M7989 Other specified soft tissue disorders: Secondary | ICD-10-CM

## 2012-01-24 ENCOUNTER — Encounter: Payer: Self-pay | Admitting: Vascular Surgery

## 2012-01-25 ENCOUNTER — Encounter: Payer: Self-pay | Admitting: Vascular Surgery

## 2012-01-25 ENCOUNTER — Ambulatory Visit (INDEPENDENT_AMBULATORY_CARE_PROVIDER_SITE_OTHER): Payer: Medicare Other | Admitting: Vascular Surgery

## 2012-01-25 ENCOUNTER — Encounter (INDEPENDENT_AMBULATORY_CARE_PROVIDER_SITE_OTHER): Payer: Medicare Other | Admitting: *Deleted

## 2012-01-25 VITALS — BP 143/74 | HR 67 | Resp 18 | Ht 72.0 in | Wt 122.0 lb

## 2012-01-25 DIAGNOSIS — T82898A Other specified complication of vascular prosthetic devices, implants and grafts, initial encounter: Secondary | ICD-10-CM

## 2012-01-25 DIAGNOSIS — N186 End stage renal disease: Secondary | ICD-10-CM

## 2012-01-25 DIAGNOSIS — T82598A Other mechanical complication of other cardiac and vascular devices and implants, initial encounter: Secondary | ICD-10-CM

## 2012-01-25 DIAGNOSIS — M7989 Other specified soft tissue disorders: Secondary | ICD-10-CM

## 2012-01-25 NOTE — Progress Notes (Signed)
Subjective:     Patient ID: [name], male   DOB: 08/03/1925, 79 y.o.   MRN: [id number]  HPI this 79-year-old male with end-stage renal disease has hemodialysis Monday Wednesday and Friday. He was referred by Dr. Mattingly for evaluation of his left upper arm AV fistula because of poor flows recently. This fistula was created by Dr. early and 2010. Fistula has not thrombosed to our knowledge.  Past Medical History  Diagnosis Date  . Hypertension   . Chronic kidney disease   . CHF (congestive heart failure)   . BPH (benign prostatic hyperplasia)   . Prostate cancer     History  Substance Use Topics  . Smoking status: Former Smoker -- 60 years    Types: Cigarettes    Quit date: 07/25/2011  . Smokeless tobacco: Never Used     Comment: smokes 4-5 per day  . Alcohol Use: No    No family history on file.  No Known Allergies  Current outpatient prescriptions:aspirin 81 MG tablet, Take 81 mg by mouth daily., Disp: , Rfl: ;  calcium acetate, Phos Binder, (PHOSLYRA) 667 MG/5ML SOLN, Take by mouth 3 (three) times daily with meals., Disp: , Rfl: ;  carvedilol (COREG) 25 MG tablet, Take 25 mg by mouth 2 (two) times daily with a meal., Disp: , Rfl: ;  multivitamin (RENA-VIT) TABS tablet, Take 1 tablet by mouth daily., Disp: , Rfl:  niacin 250 MG tablet, Take 250 mg by mouth 1 day or 1 dose., Disp: , Rfl: ;  Nutritional Supplements (FEEDING SUPPLEMENT, NEPRO CARB STEADY,) LIQD, Take 237 mLs by mouth daily at 2 PM daily at 2 PM., Disp: , Rfl: ;  simvastatin (ZOCOR) 10 MG tablet, Take 10 mg by mouth 1 day or 1 dose., Disp: , Rfl:   BP 143/74  Pulse 67  Resp 18  Ht 6' (1.829 m)  Wt 122 lb (55.339 kg)  BMI 16.55 kg/m2  Body mass index is 16.55 kg/(m^2).          Review of Systems denies chest pain, dyspnea on exertion, PND, orthopnea, hemoptysis, claudication. Is able to ambulate short distances. Lives in assisted care community     Objective:   Physical Exam blood pressure 143/74  heart rate 67 respirations 18 Gen.-alert and oriented x3 in no apparent distress HEENT normal for age Lungs no rhonchi or wheezing Cardiovascular regular rhythm no murmurs carotid pulses 3+ palpable no bruits audible Abdomen soft nontender no palpable masses Musculoskeletal free of  major deformities Skin clear -no rashes Neurologic normal Lower extremities 3+ femoral and dorsalis pedis pulses palpable bilaterally with no edema Left upper extremity with functioning AV fistula-brachial-cephalic. There is excellent pulse and thrill up to the mid to proximal upper arm. There is a firm nodule medial to this which appears to be an organized hematoma not connected to the fistula.  Today I ordered a duplex scan of the fistula which are reviewed and interpreted. The organized hematoma is discretely separate from the fistula. There is a linear stenosis at about the same level with velocities up to 794 cm/s.       Assessment:     End-stage renal disease with poorly functioning left upper arm brachiocephalic AV fistula do 2 linear stenosis    Plan:     Plan fistulogram left upper arm AV fistula by Dr Brabham on Tuesday, January 28 with possible PTA of stenotic segment      

## 2012-01-28 ENCOUNTER — Other Ambulatory Visit: Payer: Self-pay

## 2012-01-31 ENCOUNTER — Encounter (HOSPITAL_COMMUNITY): Payer: Self-pay | Admitting: Pharmacy Technician

## 2012-02-08 ENCOUNTER — Ambulatory Visit (HOSPITAL_COMMUNITY)
Admission: RE | Admit: 2012-02-08 | Discharge: 2012-02-08 | Disposition: A | Discharge status: Home / Self Care | Payer: Medicare Other | Source: Ambulatory Visit | Attending: Surgery | Admitting: Surgery

## 2012-02-08 ENCOUNTER — Encounter (HOSPITAL_COMMUNITY)
Admission: RE | Disposition: A | Discharge status: Home / Self Care | Payer: Self-pay | Source: Ambulatory Visit | Attending: Surgery

## 2012-02-08 DIAGNOSIS — T82898A Other specified complication of vascular prosthetic devices, implants and grafts, initial encounter: Secondary | ICD-10-CM

## 2012-02-08 DIAGNOSIS — I120 Hypertensive chronic kidney disease with stage 5 chronic kidney disease or end stage renal disease: Secondary | ICD-10-CM | POA: Insufficient documentation

## 2012-02-08 DIAGNOSIS — N186 End stage renal disease: Secondary | ICD-10-CM | POA: Insufficient documentation

## 2012-02-08 DIAGNOSIS — I871 Compression of vein: Secondary | ICD-10-CM | POA: Insufficient documentation

## 2012-02-08 DIAGNOSIS — Z992 Dependence on renal dialysis: Secondary | ICD-10-CM | POA: Insufficient documentation

## 2012-02-08 DIAGNOSIS — Y832 Surgical operation with anastomosis, bypass or graft as the cause of abnormal reaction of the patient, or of later complication, without mention of misadventure at the time of the procedure: Secondary | ICD-10-CM | POA: Insufficient documentation

## 2012-02-08 HISTORY — PX: SHUNTOGRAM: SHX5491

## 2012-02-08 LAB — POCT I-STAT, CHEM 8
Chloride: 102 mEq/L (ref 96–112)
Creatinine, Ser: 4.7 mg/dL — ABNORMAL HIGH (ref 0.50–1.35)
Glucose, Bld: 72 mg/dL (ref 70–99)
HCT: 39 % (ref 39.0–52.0)
Potassium: 5.5 mEq/L — ABNORMAL HIGH (ref 3.5–5.1)
Sodium: 140 mEq/L (ref 135–145)

## 2012-02-08 SURGERY — ASSESSMENT, SHUNT FUNCTION, WITH CONTRAST RADIOGRAPHIC STUDY
Anesthesia: LOCAL

## 2012-02-08 MED ORDER — SODIUM CHLORIDE 0.9 % IJ SOLN: 3.0000 mL | INTRAMUSCULAR | Status: DC | PRN

## 2012-02-08 MED ORDER — HEPARIN SODIUM (PORCINE) 1000 UNIT/ML IJ SOLN
INTRAMUSCULAR | Status: AC
  Filled 2012-02-08: qty 1

## 2012-02-08 MED ORDER — LIDOCAINE HCL (PF) 1 % IJ SOLN
INTRAMUSCULAR | Status: AC
  Filled 2012-02-08: qty 30

## 2012-02-08 NOTE — H&P (View-Only) (Signed)
Subjective:     Patient ID: [name], male   DOB: 1925-08-31, 79 y.o.   MRN: [id number]  HPI this 79 year old male with end-stage renal disease has hemodialysis Monday Wednesday and Friday. He was referred by Dr. Briant Cedar for evaluation of his left upper arm AV fistula because of poor flows recently. This fistula was created by Dr. early and 2010. Fistula has not thrombosed to our knowledge.  Past Medical History  Diagnosis Date  . Hypertension   . Chronic kidney disease   . CHF (congestive heart failure)   . BPH (benign prostatic hyperplasia)   . Prostate cancer     History  Substance Use Topics  . Smoking status: Former Smoker -- 60 years    Types: Cigarettes    Quit date: 07/25/2011  . Smokeless tobacco: Never Used     Comment: smokes 4-5 per day  . Alcohol Use: No    No family history on file.  No Known Allergies  Current outpatient prescriptions:aspirin 81 MG tablet, Take 81 mg by mouth daily., Disp: , Rfl: ;  calcium acetate, Phos Binder, (PHOSLYRA) 667 MG/5ML SOLN, Take by mouth 3 (three) times daily with meals., Disp: , Rfl: ;  carvedilol (COREG) 25 MG tablet, Take 25 mg by mouth 2 (two) times daily with a meal., Disp: , Rfl: ;  multivitamin (RENA-VIT) TABS tablet, Take 1 tablet by mouth daily., Disp: , Rfl:  niacin 250 MG tablet, Take 250 mg by mouth 1 day or 1 dose., Disp: , Rfl: ;  Nutritional Supplements (FEEDING SUPPLEMENT, NEPRO CARB STEADY,) LIQD, Take 237 mLs by mouth daily at 2 PM daily at 2 PM., Disp: , Rfl: ;  simvastatin (ZOCOR) 10 MG tablet, Take 10 mg by mouth 1 day or 1 dose., Disp: , Rfl:   BP 143/74  Pulse 67  Resp 18  Ht 6' (1.829 m)  Wt 122 lb (55.339 kg)  BMI 16.55 kg/m2  Body mass index is 16.55 kg/(m^2).          Review of Systems denies chest pain, dyspnea on exertion, PND, orthopnea, hemoptysis, claudication. Is able to ambulate short distances. Lives in assisted care community     Objective:   Physical Exam blood pressure 143/74  heart rate 67 respirations 18 Gen.-alert and oriented x3 in no apparent distress HEENT normal for age Lungs no rhonchi or wheezing Cardiovascular regular rhythm no murmurs carotid pulses 3+ palpable no bruits audible Abdomen soft nontender no palpable masses Musculoskeletal free of  major deformities Skin clear -no rashes Neurologic normal Lower extremities 3+ femoral and dorsalis pedis pulses palpable bilaterally with no edema Left upper extremity with functioning AV fistula-brachial-cephalic. There is excellent pulse and thrill up to the mid to proximal upper arm. There is a firm nodule medial to this which appears to be an organized hematoma not connected to the fistula.  Today I ordered a duplex scan of the fistula which are reviewed and interpreted. The organized hematoma is discretely separate from the fistula. There is a linear stenosis at about the same level with velocities up to 794 cm/s.       Assessment:     End-stage renal disease with poorly functioning left upper arm brachiocephalic AV fistula do 2 linear stenosis    Plan:     Plan fistulogram left upper arm AV fistula by Dr Myra Gianotti on Tuesday, January 28 with possible PTA of stenotic segment

## 2012-02-08 NOTE — Interval H&P Note (Signed)
History and Physical Interval Note:  02/08/2012 10:45 AM  [name]  has presented today for surgery, with the diagnosis of End stage renal  The various methods of treatment have been discussed with the patient and family. After consideration of risks, benefits and other options for treatment, the patient has consented to  Procedure(s) (LRB) with comments: SHUNTOGRAM (N/A) as a surgical intervention .  The patient's history has been reviewed, patient examined, no change in status, stable for surgery.  I have reviewed the patient's chart and labs.  Questions were answered to the patient's satisfaction.     [name] IV, V. WELLS

## 2012-02-08 NOTE — Op Note (Signed)
[facility]  Patient name: [name] MRN: [id number] DOB: November 06, 1925 Sex: male  02/08/2012 Pre-operative Diagnosis: Poorly functioning left upper arm fistula Post-operative diagnosis:  Same Surgeon:  Jorge Ny Procedure Performed:  1.  ultrasound access left arm fistula  2.  fistulogram  3.  venoplasty x2, left cephalic vein   Indications:  The patient has been having difficulty with access. He has a history of undergoing percutaneous intervention in 2 different areas of the cephalic vein. He comes in for further evaluation.  Procedure:  The patient was identified in the holding area and taken to room 8.  The patient was then placed supine on the table and prepped and draped in the usual sterile fashion.  A time out was called.  Ultrasound was used to evaluate the fistula.  The vein was patent and compressible.  A digital ultrasound image was acquired.  The fistula was then accessed under ultrasound guidance using a micropuncture needle.  An 018 wire was then asvanced without resistance and a micropuncture sheath was placed.  Contrast injections were then performed through the sheath.  Findings:  The central venous system is widely patent. The cephalic vein fistula is patent throughout the arm. There is no significant stenosis at the arterial anastomosis. Approximately 2 cm more proximal to the arterial anastomosis there is an area of the artery which corresponds to an area that has been previously treated. This is approximately 60% stenosis. There is a second area in approximately the mid forearm with recurrent stenosis. The remaining portion of the cephalic vein fistula is small in diameter but patent without significant stenosis   Intervention:  After the above images were obtained, the decision was made to proceed with intervention. A 7 French sheath was placed. The patient was heparinized. A 014 wire was advanced across the arterial anastomosis. I  selected a 7 mm balloon and perform balloon angioplasty of the more distal lesion near the arterial anastomosis. This was taken to nominal pressure and held up for 1 minute. I then retracted the balloon to treat the second lesion within the cephalic vein. The balloon was taken to grade pressure and held up for 1 minute. Completion arteriogram revealed significant improvement in flow through the fistula with near resolution of the stenosis in each area. At this point the decision was made to terminate the procedure. Catheters and wires were removed. The patient's access site was closed with a pursestring suture.  Impression:  #1  successful venoplasty of 2 stenotic areas within the cephalic vein.   #2  no evidence of central venous stenosis  #3  the patient remained a candidate for percutaneous intervention     V. Durene Cal, M.D. [facility] [place] Office: [phone] Pager:  [phone]

## 2012-04-18 ENCOUNTER — Encounter: Payer: Self-pay | Admitting: Internal Medicine

## 2012-04-18 ENCOUNTER — Ambulatory Visit (INDEPENDENT_AMBULATORY_CARE_PROVIDER_SITE_OTHER): Payer: 59 | Admitting: Internal Medicine

## 2012-04-18 VITALS — BP 110/68 | HR 71 | Temp 98.2°F | Resp 16 | Ht 71.0 in | Wt 160.4 lb

## 2012-04-18 DIAGNOSIS — E785 Hyperlipidemia, unspecified: Secondary | ICD-10-CM

## 2012-04-18 DIAGNOSIS — I10 Essential (primary) hypertension: Secondary | ICD-10-CM

## 2012-04-18 DIAGNOSIS — N186 End stage renal disease: Secondary | ICD-10-CM

## 2012-04-18 NOTE — Assessment & Plan Note (Signed)
Blood pressure well controlled. Has had reduction in his lopressor to 25 mg bid 3 days a week and 25 mg daily once a week. Continue this with statin and asa for now

## 2012-04-18 NOTE — Progress Notes (Signed)
Subjective:    Patient ID: [name], male    DOB: [birth year], 79 y.o.   MRN: [id number]  Chief Complaint  Patient presents with  . Hypertension  . Chronic Kidney Disease   No Known Allergies HPI Pt here with his daughter. He lives at [facility]. He has no complaints this visit. He uses walker/ wheelchair to move about. Able to feed independently, needs assistance with shower and changing clothes. Is toileting independently. No falls reported. Reviewed med list   Review of Systems  Constitutional: Negative for fever, chills, activity change and appetite change.  HENT: Negative for congestion.   Eyes: Negative for discharge.  Respiratory: Negative for cough and shortness of breath.   Cardiovascular: Negative for chest pain, palpitations and leg swelling.  Gastrointestinal: Negative for abdominal pain.  Endocrine: Negative for cold intolerance.  Genitourinary: Negative for dysuria and flank pain.  Musculoskeletal: Positive for arthralgias. Negative for back pain and joint swelling.  Allergic/Immunologic: Negative for environmental allergies.  Neurological: Negative for dizziness, syncope, weakness, light-headedness, numbness and headaches.  Hematological: Negative for adenopathy.  Psychiatric/Behavioral: Negative for behavioral problems and agitation.   Past Medical History  Diagnosis Date  . Hypertension   . Chronic kidney disease   . CHF (congestive heart failure)   . BPH (benign prostatic hyperplasia)   . Prostate cancer   . Anemia   . Hyperlipidemia   . Dementia in conditions classified elsewhere without behavioral disturbance(294.10)    Past Surgical History  Procedure Laterality Date  . Prostate biopsy  04/01/2000  . Av fistula placement  08/11/2009  . Prostatectomy    . Cystoscopy  06/21/2003   Family History  Problem Relation Age of Onset  . Stroke Father   . Early death Sister     Some type of accident  . Cancer Sister     Throat  . Diabetes Sister    . Hypertension Sister   . Dementia Sister       Objective:   Physical Exam  Constitutional: He is oriented to person, place, and time. He appears well-developed and well-nourished. No distress.  HENT:  Head: Normocephalic and atraumatic.  Right Ear: External ear normal.  Left Ear: External ear normal.  Nose: Nose normal.  Mouth/Throat: Oropharynx is clear and moist. No oropharyngeal exudate.  Eyes: Conjunctivae and EOM are normal. Pupils are equal, round, and reactive to light. Right eye exhibits no discharge. Left eye exhibits no discharge. No scleral icterus.  Neck: Normal range of motion. Neck supple. No tracheal deviation present. No thyromegaly present.  Cardiovascular: Normal rate, regular rhythm and normal heart sounds.   Pulmonary/Chest: Effort normal and breath sounds normal. No respiratory distress.  Abdominal: Soft. Bowel sounds are normal. He exhibits no distension. There is no tenderness.  Genitourinary:  refused  Musculoskeletal: Normal range of motion. He exhibits no edema and no tenderness.  Lymphadenopathy:    He has no cervical adenopathy.  Neurological: He is alert and oriented to person, place, and time. He has normal reflexes. No cranial nerve deficit.  Skin: Skin is warm and dry. No rash noted. He is not diaphoretic.  Psychiatric: He has a normal mood and affect. His behavior is normal.  left upper arm AV fistula [resent  BP 110/68  Pulse 71  Temp(Src) 98.2 F (36.8 C) (Oral)  Resp 16  Ht 5\' 11"  (1.803 m)  Wt 160 lb 6.4 oz (72.757 kg)  BMI 22.38 kg/m2  MMSE 29/30 04/18/12 CBC    Component Value Date/Time  WBC 12.4* 04/16/2011 0733   RBC 3.43* 04/16/2011 0733   HGB 13.3 02/08/2012 0822   HCT 39.0 02/08/2012 0822   PLT 169 04/16/2011 0733   MCV 90.1 04/16/2011 0733   MCH 30.9 04/16/2011 0733   MCHC 34.3 04/16/2011 0733   RDW 15.7* 04/16/2011 0733   LYMPHSABS 0.4* 04/12/2011 1842   MONOABS 0.7 04/12/2011 1842   EOSABS 0.0 04/12/2011 1842   BASOSABS 0.0 04/12/2011 1842    CMP     Component Value Date/Time   NA 140 02/08/2012 0822   K 5.5* 02/08/2012 0822   CL 102 02/08/2012 0822   CO2 17* 04/16/2011 0733   GLUCOSE 72 02/08/2012 0822   BUN 39* 02/08/2012 0822   CREATININE 4.70* 02/08/2012 0822   CALCIUM 6.5* 04/16/2011 0733   CALCIUM 7.5* 11/30/2006 1740   PROT 5.3* 04/13/2011 1307   ALBUMIN 2.1* 04/16/2011 0733   AST 16 04/13/2011 1307   ALT 12 04/13/2011 1307   ALKPHOS 76 04/13/2011 1307   BILITOT 0.2* 04/13/2011 1307   GFRNONAA 8* 04/16/2011 0733   GFRAA 9* 04/16/2011 0733       Assessment & Plan:  HTN (hypertension) Blood pressure well controlled. Has had reduction in his lopressor to 25 mg bid 3 days a week and 25 mg daily once a week. Continue this with statin and asa for now  End stage renal disease S/p HD 3 days a week. Continue mectorol and calcium acetate for now. Follow with renal. Reviewed labs- albumin 3.6, k 4.9, hb 11, phos 4.9, corrected calcium 9.2  Hyperlipidemia Continue simvastatin 10 mg daily and monitor  routine visit- no further colonoscopy indicated, refuses rectal exam. Follows with renal. Reviewed lab from kidney doctor's office

## 2012-04-18 NOTE — Progress Notes (Signed)
Failed Clock Drawing 

## 2012-04-18 NOTE — Assessment & Plan Note (Signed)
Continue simvastatin 10 mg daily and monitor

## 2012-04-18 NOTE — Assessment & Plan Note (Signed)
S/p HD 3 days a week. Continue mectorol and calcium acetate for now. Follow with renal. Reviewed labs- albumin 3.6, k 4.9, hb 11, phos 4.9, corrected calcium 9.2

## 2012-07-17 ENCOUNTER — Other Ambulatory Visit: Payer: Self-pay | Admitting: *Deleted

## 2012-07-17 ENCOUNTER — Encounter: Payer: Self-pay | Admitting: *Deleted

## 2012-07-18 ENCOUNTER — Ambulatory Visit: Payer: Medicare Other | Admitting: Internal Medicine

## 2012-07-28 ENCOUNTER — Other Ambulatory Visit: Payer: Self-pay | Admitting: Internal Medicine

## 2012-07-28 DIAGNOSIS — J159 Unspecified bacterial pneumonia: Secondary | ICD-10-CM

## 2012-07-28 MED ORDER — MOXIFLOXACIN HCL 400 MG PO TABS: 400.0000 mg | ORAL_TABLET | Freq: Every day | ORAL | Status: DC

## 2012-08-21 ENCOUNTER — Encounter: Payer: Self-pay | Admitting: Internal Medicine

## 2012-10-27 ENCOUNTER — Telehealth: Payer: Self-pay | Admitting: *Deleted

## 2012-10-27 NOTE — Telephone Encounter (Signed)
Daughter called and Left message on my voicemail and stated that she got the message where her father needs an appointment before Dr. Glade Lloyd will fill out forms for [facility]. Tried calling her back and Left message that yes she did need to make her father an appointment.

## 2012-11-02 ENCOUNTER — Ambulatory Visit: Payer: Self-pay | Admitting: Nurse Practitioner

## 2012-11-02 ENCOUNTER — Encounter: Payer: Self-pay | Admitting: Nurse Practitioner

## 2012-11-02 ENCOUNTER — Ambulatory Visit (INDEPENDENT_AMBULATORY_CARE_PROVIDER_SITE_OTHER): Payer: 59 | Admitting: Nurse Practitioner

## 2012-11-02 VITALS — BP 124/60 | HR 80 | Temp 98.1°F | Resp 18 | Wt 161.0 lb

## 2012-11-02 DIAGNOSIS — I5022 Chronic systolic (congestive) heart failure: Secondary | ICD-10-CM

## 2012-11-02 DIAGNOSIS — I10 Essential (primary) hypertension: Secondary | ICD-10-CM

## 2012-11-02 DIAGNOSIS — N186 End stage renal disease: Secondary | ICD-10-CM

## 2012-11-02 DIAGNOSIS — I509 Heart failure, unspecified: Secondary | ICD-10-CM

## 2012-11-02 DIAGNOSIS — E785 Hyperlipidemia, unspecified: Secondary | ICD-10-CM

## 2012-11-02 NOTE — Patient Instructions (Addendum)
Please have [facility] send records of labs Have nephrology send copy of lab work   To [facility] Please update your records and place Dr Oneal Grout as pts PCP  Pt to follow up in 3 months to see PANDEY with fasting blood work before visit

## 2012-11-02 NOTE — Progress Notes (Signed)
Patient ID: [name], male   DOB: 1925-09-16, 79 y.o.   MRN: [id number]   No Known Allergies  Chief Complaint  Patient presents with  . Follow-up       paper work for [employer]    HPI: Patient is a 79 y.o. male seen in the office today for follow up Needs MAR signed but pt had not had recent office visit Does not have any recent lab work No concerns for today's visit Review of Systems:  Review of Systems  Constitutional: Negative for fever, chills and weight loss.  Respiratory: Negative for shortness of breath.   Cardiovascular: Negative for chest pain and palpitations.  Gastrointestinal: Negative for heartburn, diarrhea and constipation.  Genitourinary: Negative for dysuria.       Dialysis MWF  Musculoskeletal: Negative for joint pain and myalgias.  Neurological: Negative for weakness.  Psychiatric/Behavioral: Positive for memory loss. Negative for depression. The patient is not nervous/anxious.      Past Medical History  Diagnosis Date  . Hypertension   . Chronic kidney disease   . CHF (congestive heart failure)   . BPH (benign prostatic hyperplasia)   . Prostate cancer   . Anemia   . Hyperlipidemia   . Dementia in conditions classified elsewhere without behavioral disturbance(294.10)    Past Surgical History  Procedure Laterality Date  . Prostate biopsy  04/01/2000  . Av fistula placement  08/11/2009  . Prostatectomy    . Cystoscopy  06/21/2003   Social History:   reports that he quit smoking about 15 months ago. His smoking use included Cigarettes. He has a 15 pack-year smoking history. He has never used smokeless tobacco. He reports that he does not drink alcohol or use illicit drugs.  Family History  Problem Relation Age of Onset  . Stroke Father   . Early death Sister     Some type of accident  . Cancer Sister     Throat  . Diabetes Sister   . Hypertension Sister   . Dementia Sister     Medications: Patient's Medications  New Prescriptions    No medications on file  Previous Medications   ASPIRIN 81 MG TABLET    Take 81 mg by mouth daily.   CALCIUM ACETATE, PHOS BINDER, (PHOSLYRA) 667 MG/5ML SOLN    Take by mouth 3 (three) times daily with meals.   DOXERCALCIFEROL (HECTOROL) 0.5 MCG CAPSULE    Take two tablets by mouth three times weekly at diayslis   METOPROLOL TARTRATE (LOPRESSOR) 25 MG TABLET    Take 1/2 tablet on Sunday, Tuesday, Thursday and Saturday. Take 1/2 tablet by mouth at bedtime on Monday, Wednesday and Friday for blood pressure   MULTIPLE VITAMINS-MINERALS (CERTA-VITE SENIOR-LUTEIN PO)    Take 1 tablet by mouth. Take 1 tablet (4-300-250)by mouth every day   SIMVASTATIN (ZOCOR) 10 MG TABLET    Take 10 mg by mouth 1 day or 1 dose.  Modified Medications   No medications on file  Discontinued Medications   MOXIFLOXACIN (AVELOX) 400 MG TABLET    Take 1 tablet (400 mg total) by mouth daily.   MULTIVITAMIN (RENA-VIT) TABS TABLET    Take 1 tablet by mouth daily.     Physical Exam:  Filed Vitals:   11/02/12 1558  BP: 124/60  Pulse: 80  Temp: 98.1 F (36.7 C)  TempSrc: Oral  Resp: 18  Weight: 161 lb (73.029 kg)  SpO2: 97%    Physical Exam  Constitutional: He is oriented to person,  place, and time and well-developed, well-nourished, and in no distress. No distress.  Cardiovascular: Normal rate, regular rhythm and normal heart sounds.   Pulmonary/Chest: Effort normal and breath sounds normal. No respiratory distress.  Abdominal: Soft. Bowel sounds are normal. He exhibits no distension.  Musculoskeletal: He exhibits no edema and no tenderness.  Neurological: He is alert and oriented to person, place, and time.  Skin: Skin is warm and dry. He is not diaphoretic. No erythema.  Right upper arm fistula      Labs reviewed: Basic Metabolic Panel:  Recent Labs  40/98/11 0822  NA 140  K 5.5*  CL 102  GLUCOSE 72  BUN 39*  CREATININE 4.70*   Liver Function Tests: No results found for this basename: AST,  ALT, ALKPHOS, BILITOT, PROT, ALBUMIN,  in the last 8760 hours No results found for this basename: LIPASE, AMYLASE,  in the last 8760 hours No results found for this basename: AMMONIA,  in the last 8760 hours CBC:  Recent Labs  02/08/12 0822  HGB 13.3  HCT 39.0      Assessment/Plan 1. HTN (hypertension) -stable on current medications  2. End stage renal disease - conts dialysis MWF - CBC With differential/Platelet; Future - Comprehensive metabolic panel; Future  3. Chronic systolic congestive heart failure -stable no signs of worsening heart failure  4. Hyperlipidemia -conts statin - Lipid panel; Future to evaluate cholesterol   Pts daughter here for [facility] to be signed however incorrect PCP name on form; to have facility update form and fax to [place] To follow up in 3 months with PANDEY; pt to have fasting blood work before visit

## 2012-11-21 ENCOUNTER — Ambulatory Visit (INDEPENDENT_AMBULATORY_CARE_PROVIDER_SITE_OTHER): Payer: 59

## 2012-11-21 VITALS — BP 108/63 | HR 73 | Resp 16 | Ht 73.0 in | Wt 160.0 lb

## 2012-11-21 DIAGNOSIS — B351 Tinea unguium: Secondary | ICD-10-CM

## 2012-11-21 DIAGNOSIS — M79609 Pain in unspecified limb: Secondary | ICD-10-CM

## 2012-11-21 DIAGNOSIS — Q828 Other specified congenital malformations of skin: Secondary | ICD-10-CM

## 2012-11-21 NOTE — Patient Instructions (Signed)

## 2012-11-21 NOTE — Progress Notes (Signed)
  Subjective:    Patient ID: [name], male    DOB: March 16, 1925, 79 y.o.   MRN: [id number] "Clip those toenails."  HPI    Review of Systems  Constitutional: Negative.   Respiratory: Negative.   Cardiovascular: Negative.   Gastrointestinal: Negative.   Endocrine: Negative.   Genitourinary: Negative.   Musculoskeletal: Negative.   Skin: Negative.   Allergic/Immunologic: Negative.   Neurological: Negative.   Hematological: Negative.   Psychiatric/Behavioral: Negative.        Objective:   Physical Exam  Vitals reviewed. Constitutional: He is oriented to person, place, and time. He appears well-developed.  Cardiovascular:  Pulses:      Dorsalis pedis pulses are 2+ on the right side, and 2+ on the left side.       Posterior tibial pulses are 1+ on the right side, and 1+ on the left side.  Capillary refill 3 seconds all digits skin temperature warm to cool turgor greatly diminished bilateral no varicosities noted no edema noted atrophy the skin noted bilateral  Musculoskeletal:  Mild flexible digital contractures noted bilateral decreased muscle strength and tone patient is nonambulatory and wheelchair.  Neurological: He is alert and oriented to person, place, and time. He exhibits abnormal muscle tone.  Neurologically epicritic and proprioceptive sensations intact although diminished bilateral on Semmes Weinstein testing toe plantar forefoot DTRs not listed there is normal plantar response is definite weakness motor function bilateral lower extremities and symmetric muscle atrophy and lower extremity weakness.  Skin: Skin is warm and dry. No cyanosis. Nails show no clubbing.  Skin color pigment normal there is hyperpigmentation lower leg dry scaling skin some fissuring nails thick extremely hypertrophic friable and criptotic have not been cut greater than one-year. Time is also severe distal clavus of keratoses distal tuft of the left hallux with hemorrhage a keratoses and  pre-ulcerative changes on debridement this is treated with lumicain and Silvadene and Band-Aid dressing at this time monitor for any changes and complications  Psychiatric: He has a normal mood and affect. His behavior is normal.          Assessment & Plan:  Onychomycosis with painful severely deformed kyphotic nails debrided x10 the presence of pain symptomatology. Also severe distal clavus first digit left foot debrided with pre-ulcer being identified distal clavus of the left hallux followup 3-4 months for continued palliative care in the future as needed suggest Neosporin and Band-Aid to the left hallux for the next couple of days advised to contact me change or difficulties develop stressed to followup for palliative care maintain lotion to the skin for the dry scaling skin on a daily basis as well  Alvan Dame DPM

## 2013-02-01 ENCOUNTER — Other Ambulatory Visit: Payer: 59

## 2013-02-01 DIAGNOSIS — E785 Hyperlipidemia, unspecified: Secondary | ICD-10-CM

## 2013-02-01 DIAGNOSIS — N186 End stage renal disease: Secondary | ICD-10-CM

## 2013-02-02 LAB — COMPREHENSIVE METABOLIC PANEL
A/G RATIO: 1.2 (ref 1.1–2.5)
ALT: 8 IU/L (ref 0–44)
AST: 13 IU/L (ref 0–40)
Albumin: 4.1 g/dL (ref 3.5–4.7)
Alkaline Phosphatase: 89 IU/L (ref 39–117)
BUN/Creatinine Ratio: 5 — ABNORMAL LOW (ref 10–22)
BUN: 34 mg/dL — ABNORMAL HIGH (ref 8–27)
CALCIUM: 9.7 mg/dL (ref 8.6–10.2)
CHLORIDE: 99 mmol/L (ref 97–108)
CO2: 27 mmol/L (ref 18–29)
Creatinine, Ser: 6.4 mg/dL — ABNORMAL HIGH (ref 0.76–1.27)
GFR calc Af Amer: 8 mL/min/{1.73_m2} — ABNORMAL LOW (ref >59)
GFR calc non Af Amer: 7 mL/min/{1.73_m2} — ABNORMAL LOW (ref >59)
GLUCOSE: 82 mg/dL (ref 65–99)
Globulin, Total: 3.4 g/dL (ref 1.5–4.5)
POTASSIUM: 5.1 mmol/L (ref 3.5–5.2)
SODIUM: 143 mmol/L (ref 134–144)
TOTAL PROTEIN: 7.5 g/dL (ref 6.0–8.5)
Total Bilirubin: 0.3 mg/dL (ref 0.0–1.2)

## 2013-02-02 LAB — CBC WITH DIFFERENTIAL
BASOS ABS: 0 10*3/uL (ref 0.0–0.2)
Basos: 0 %
EOS ABS: 0.6 10*3/uL — AB (ref 0.0–0.4)
Eos: 9 %
HCT: 37 % — ABNORMAL LOW (ref 37.5–51.0)
Hemoglobin: 12 g/dL — ABNORMAL LOW (ref 12.6–17.7)
IMMATURE GRANS (ABS): 0 10*3/uL (ref 0.0–0.1)
IMMATURE GRANULOCYTES: 0 %
LYMPHS: 22 %
Lymphocytes Absolute: 1.5 10*3/uL (ref 0.7–3.1)
MCH: 31.3 pg (ref 26.6–33.0)
MCHC: 32.4 g/dL (ref 31.5–35.7)
MCV: 97 fL (ref 79–97)
MONOS ABS: 0.6 10*3/uL (ref 0.1–0.9)
Monocytes: 9 %
NEUTROS PCT: 60 %
Neutrophils Absolute: 4.2 10*3/uL (ref 1.4–7.0)
PLATELETS: 189 10*3/uL (ref 150–379)
RBC: 3.83 x10E6/uL — ABNORMAL LOW (ref 4.14–5.80)
RDW: 15 % (ref 12.3–15.4)
WBC: 7 10*3/uL (ref 3.4–10.8)

## 2013-02-02 LAB — LIPID PANEL
CHOLESTEROL TOTAL: 138 mg/dL (ref 100–199)
Chol/HDL Ratio: 3.1 ratio units (ref 0.0–5.0)
HDL: 45 mg/dL (ref >39)
LDL Calculated: 77 mg/dL (ref 0–99)
TRIGLYCERIDES: 82 mg/dL (ref 0–149)
VLDL Cholesterol Cal: 16 mg/dL (ref 5–40)

## 2013-02-05 ENCOUNTER — Encounter: Payer: Self-pay | Admitting: *Deleted

## 2013-02-06 ENCOUNTER — Encounter: Payer: Self-pay | Admitting: Internal Medicine

## 2013-02-06 ENCOUNTER — Ambulatory Visit (INDEPENDENT_AMBULATORY_CARE_PROVIDER_SITE_OTHER): Payer: 59 | Admitting: Internal Medicine

## 2013-02-06 VITALS — BP 140/78 | HR 72 | Temp 98.0°F | Wt 164.5 lb

## 2013-02-06 DIAGNOSIS — I10 Essential (primary) hypertension: Secondary | ICD-10-CM

## 2013-02-06 DIAGNOSIS — N186 End stage renal disease: Secondary | ICD-10-CM

## 2013-02-06 DIAGNOSIS — E785 Hyperlipidemia, unspecified: Secondary | ICD-10-CM

## 2013-02-06 MED ORDER — SIMVASTATIN 5 MG PO TABS: 10.0000 mg | ORAL_TABLET | ORAL | Status: DC

## 2013-02-06 NOTE — Progress Notes (Signed)
Patient ID: [name], male   DOB: 22-Nov-1925, 80 y.o.   MRN: [id number]    Chief Complaint  Patient presents with  . Medical Managment of Chronic Issues    3 month f/u, discuss labs. (printed)   No Known Allergies  HPI Patient is here with his daughter [name]. He lives at [place] at independent living facility. He has no complaints this visit. No concerns from his daughter. He uses walker/ wheelchair to move about. no falls reported. No new skin concern. Able to feed independently, needs assistance with shower and changing clothes. Is toileting independently. No falls reported. Reviewed med list. He is on hemodialysis 3 days a week. bp is stable.  Review of Systems  Constitutional: Negative for fever, chills, activity change and appetite change.  HENT: Negative for congestion.   Eyes: Negative for discharge.  Respiratory: Negative for cough and shortness of breath.   Cardiovascular: Negative for chest pain, palpitations and leg swelling.  Gastrointestinal: Negative for abdominal pain.  Endocrine: Negative for cold intolerance.  Genitourinary: Negative for dysuria and flank pain.  Musculoskeletal: PNegative for back pain, joint pain and joint swelling.  Allergic/Immunologic: Negative for environmental allergies.  Neurological: Negative for dizziness, syncope, weakness, light-headedness, numbness and headaches.  Hematological: Negative for adenopathy.  Psychiatric/Behavioral: Negative for behavioral problems and agitation. No insomnia  Past Medical History  Diagnosis Date  . Hypertension   . Chronic kidney disease   . CHF (congestive heart failure)   . BPH (benign prostatic hyperplasia)   . Prostate cancer   . Anemia   . Hyperlipidemia   . Dementia in conditions classified elsewhere without behavioral disturbance    Current Outpatient Prescriptions on File Prior to Visit  Medication Sig Dispense Refill  . aspirin 81 MG tablet Take 81 mg by mouth daily.      .  calcium acetate, Phos Binder, (PHOSLYRA) 667 MG/5ML SOLN Take by mouth 3 (three) times daily with meals.      . metoprolol tartrate (LOPRESSOR) 25 MG tablet Take 1/2 tablet on Sunday, Tuesday, Thursday and Saturday. Take 1/2 tablet by mouth at bedtime on Monday, Wednesday and Friday for blood pressure      . Multiple Vitamins-Minerals (CERTA-VITE SENIOR-LUTEIN PO) Take 1 tablet by mouth. Take 1 tablet (4-300-250)by mouth every day      . simvastatin (ZOCOR) 10 MG tablet Take 10 mg by mouth 1 day or 1 dose.      Marland Kitchen doxercalciferol (HECTOROL) 0.5 MCG capsule Take two tablets by mouth three times weekly at diayslis       No current facility-administered medications on file prior to visit.   Physical exam BP 140/78  Pulse 72  Temp(Src) 98 F (36.7 C) (Oral)  Wt 164 lb 8 oz (74.617 kg)  SpO2 98%  Constitutional: He is oriented to person, place, and time. He appears well-developed and well-nourished. No distress.  HENT:   Head: Normocephalic and atraumatic.  Nose: Nose normal.   Mouth/Throat: Oropharynx is clear and moist. No oropharyngeal exudate.  Eyes: Conjunctivae and EOM are normal. Pupils are equal, round, and reactive to light. Right eye exhibits no discharge. Left eye exhibits no discharge. No scleral icterus.  Neck: Normal range of motion. Neck supple. No tracheal deviation present. No thyromegaly present.  Cardiovascular: Normal rate, regular rhythm and normal heart sounds.   Pulmonary/Chest: Effort normal and breath sounds normal. No respiratory distress.  Abdominal: Soft. Bowel sounds are normal. He exhibits no distension. There is no tenderness.   Musculoskeletal:  Normal range of motion. He exhibits no edema and no tenderness.  Lymphadenopathy:    He has no cervical adenopathy.  Neurological: He is alert and oriented to person, place, and time. He has normal reflexes. No cranial nerve deficit.  Skin: Skin is warm and dry. No rash noted. He is not diaphoretic. Left upper arm  fistula for dialysis Psychiatric: He has a normal mood and affect. His behavior is normal.   Labs- CBC    Component Value Date/Time   WBC 7.0 02/01/2013 0822   WBC 12.4* 04/16/2011 0733   RBC 3.83* 02/01/2013 0822   RBC 3.43* 04/16/2011 0733   HGB 12.0* 02/01/2013 0822   HCT 37.0* 02/01/2013 0822   PLT 189 02/01/2013 0822   MCV 97 02/01/2013 0822   MCH 31.3 02/01/2013 0822   MCH 30.9 04/16/2011 0733   MCHC 32.4 02/01/2013 0822   MCHC 34.3 04/16/2011 0733   RDW 15.0 02/01/2013 0822   RDW 15.7* 04/16/2011 0733   LYMPHSABS 1.5 02/01/2013 0822   LYMPHSABS 0.4* 04/12/2011 1842   MONOABS 0.7 04/12/2011 1842   EOSABS 0.6* 02/01/2013 0822   EOSABS 0.0 04/12/2011 1842   BASOSABS 0.0 02/01/2013 0822   BASOSABS 0.0 04/12/2011 1842    CMP     Component Value Date/Time   NA 143 02/01/2013 0822   NA 140 02/08/2012 0822   K 5.1 02/01/2013 0822   CL 99 02/01/2013 0822   CO2 27 02/01/2013 0822   GLUCOSE 82 02/01/2013 0822   GLUCOSE 72 02/08/2012 0822   BUN 34* 02/01/2013 0822   BUN 39* 02/08/2012 0822   CREATININE 6.40* 02/01/2013 0822   CALCIUM 9.7 02/01/2013 0822   CALCIUM 7.5* 11/30/2006 1740   PROT 7.5 02/01/2013 0822   PROT 5.3* 04/13/2011 1307   ALBUMIN 2.1* 04/16/2011 0733   AST 13 02/01/2013 0822   ALT 8 02/01/2013 0822   ALKPHOS 89 02/01/2013 0822   BILITOT 0.3 02/01/2013 0822   GFRNONAA 7* 02/01/2013 0822   GFRAA 8* 02/01/2013 0822   Lipid Panel     Component Value Date/Time   CHOL  Value: 140        ATP III CLASSIFICATION:  <200     mg/dL   Desirable  200-239  mg/dL   Borderline High  >=240    mg/dL   High        08/08/2009 0325   TRIG 82 02/01/2013 0822   HDL 45 02/01/2013 0822   HDL 32* 08/08/2009 0325   CHOLHDL 3.1 02/01/2013 0822   CHOLHDL 4.4 08/08/2009 0325   VLDL 17 08/08/2009 0325   LDLCALC 77 02/01/2013 0822   LDLCALC  Value: 91        Total Cholesterol/HDL:CHD Risk Coronary Heart Disease Risk Table                     Men   Women  1/2 Average Risk   3.4   3.3  Average Risk       5.0   4.4  2 X Average Risk    9.6   7.1  3 X Average Risk  23.4   11.0        Use the calculated Patient Ratio above and the CHD Risk Table to determine the patient's CHD Risk.        ATP III CLASSIFICATION (LDL):  <100     mg/dL   Optimal  100-129  mg/dL   Near or Above  Optimal  130-159  mg/dL   Borderline  160-189  mg/dL   High  >190     mg/dL   Very High 08/08/2009 0325    Assessment/plan  HTN  Blood pressure well controlled. Continue lopressor 12.5 mg daily with bay aspirin and statin  Hyperlipidemia Improved. Decrease zocor to 5 mg daily for now and reassess  End stage renal disease S/p HD 3 days a week. Continue hectorol and calcium acetate for now. Follow with renal.

## 2013-02-20 ENCOUNTER — Ambulatory Visit: Payer: 59

## 2013-03-12 ENCOUNTER — Other Ambulatory Visit: Payer: Self-pay | Admitting: Internal Medicine

## 2013-03-12 DIAGNOSIS — J189 Pneumonia, unspecified organism: Secondary | ICD-10-CM

## 2013-03-12 MED ORDER — MOXIFLOXACIN HCL 400 MG PO TABS: 400.0000 mg | ORAL_TABLET | Freq: Every day | ORAL | Status: AC

## 2013-03-12 MED ORDER — IPRATROPIUM-ALBUTEROL 0.5-2.5 (3) MG/3ML IN SOLN: 3.0000 mL | Freq: Four times a day (QID) | RESPIRATORY_TRACT | Status: DC | PRN

## 2013-03-12 NOTE — Progress Notes (Signed)
Received fax from [facility] of CXR done over the weekend 03/11/13.  It showed infrahilar airspace opacities bilaterally which could be consistent with pneumonitis vs. Atelectatic change.  This was done due to increased cough.  Fax also requested order for nebulizer and treatments.  I prescribed avelox for 7 days and duonebs q6hrs prn.  F/u with Dr. Bubba Camp in 1 week here.

## 2013-03-13 ENCOUNTER — Telehealth: Payer: Self-pay | Admitting: *Deleted

## 2013-03-13 NOTE — Telephone Encounter (Signed)
[name] with [facility] called and stated that Lestine Box ordered an antibiotic over the weekend for patient [name] 500mg  every 48hours for 2 weeks and then Dr. Mariea Clonts order [name] yesterday. I told her that the Positive U/A was faxed to [place] yesterday but had no response of Claudette ordering anything. She apoligized and will fax over an order to discontinue one of them. Agreed.

## 2013-03-20 ENCOUNTER — Ambulatory Visit (INDEPENDENT_AMBULATORY_CARE_PROVIDER_SITE_OTHER): Payer: 59

## 2013-03-20 VITALS — BP 165/77 | HR 70 | Resp 16

## 2013-03-20 DIAGNOSIS — M79609 Pain in unspecified limb: Secondary | ICD-10-CM

## 2013-03-20 DIAGNOSIS — Q828 Other specified congenital malformations of skin: Secondary | ICD-10-CM

## 2013-03-20 DIAGNOSIS — B351 Tinea unguium: Secondary | ICD-10-CM

## 2013-03-20 NOTE — Progress Notes (Signed)
   Subjective:    Patient ID: [name], male    DOB: [birth year], 80 y.o.   MRN: [id number]  HPI Comments: "Cut the toenails"     Review of Systems new changes or findings     Objective:   Physical Exam [name] objective findings as follows patient does have diabetes hours and acute renal failure is on dialysis pedal pulses dorsalis pedis pulse one over 4 bilateral PT thready nonpalpable bilateral +2 edema noted bilateral capillary refill timed 3-4 seconds. Skin texture and temperature and turgor greatly diminished some hyperpigmentation around the ankle is also hemorrhage a keratoses distal clavus of the left hallux with distal clavus formation. No open wounds ulcerations mild digital contractures noted bilateral lesser digits. Patient is minimally endotracheal wheelchair neurologically epicritic and proprioceptive sensations appear to be diminished bilateral on Semmes Weinstein testing to the forefoot DTRs not elicited there is some muscle atrophy decreased activities and muscle strength noted in the skin warm dry nails thickened and criptotic incurvated friable brittle discolored 1 through 5 bilateral with previous with extreme hypertrophy and dystrophy of the nails also distal clavus of the left hallux noted.        Assessment & Plan:  Assessment onychomycosis painful mycotic nails 1 through 5 bilateral debridement at this time also distal clavus of left hallux is debrided maintain accommodative shoe gear suggested lotion to the feet daily on all areas except between toes recheck for followup continued palliative care in 3 months or as needed  [name] DPM

## 2013-03-20 NOTE — Patient Instructions (Signed)

## 2013-03-27 ENCOUNTER — Encounter: Payer: Self-pay | Admitting: Internal Medicine

## 2013-04-04 ENCOUNTER — Encounter: Payer: Self-pay | Admitting: Internal Medicine

## 2013-05-29 ENCOUNTER — Encounter: Payer: Self-pay | Admitting: *Deleted

## 2013-06-05 ENCOUNTER — Encounter: Payer: Self-pay | Admitting: Internal Medicine

## 2013-06-05 ENCOUNTER — Ambulatory Visit (INDEPENDENT_AMBULATORY_CARE_PROVIDER_SITE_OTHER): Payer: 59 | Admitting: Internal Medicine

## 2013-06-05 VITALS — BP 118/66 | HR 70 | Temp 98.9°F | Wt 166.0 lb

## 2013-06-05 DIAGNOSIS — Z720 Tobacco use: Secondary | ICD-10-CM

## 2013-06-05 DIAGNOSIS — N186 End stage renal disease: Secondary | ICD-10-CM

## 2013-06-05 DIAGNOSIS — I10 Essential (primary) hypertension: Secondary | ICD-10-CM

## 2013-06-05 DIAGNOSIS — E785 Hyperlipidemia, unspecified: Secondary | ICD-10-CM

## 2013-06-05 DIAGNOSIS — F17200 Nicotine dependence, unspecified, uncomplicated: Secondary | ICD-10-CM

## 2013-06-05 DIAGNOSIS — Z992 Dependence on renal dialysis: Secondary | ICD-10-CM

## 2013-06-05 DIAGNOSIS — I509 Heart failure, unspecified: Secondary | ICD-10-CM

## 2013-06-05 DIAGNOSIS — I5022 Chronic systolic (congestive) heart failure: Secondary | ICD-10-CM

## 2013-06-05 MED ORDER — AMBULATORY NON FORMULARY MEDICATION: Status: DC

## 2013-06-05 NOTE — Progress Notes (Signed)
Patient ID: [name], male   DOB: 03/03/1925, 80 y.o.   MRN: [id number]   Chief Complaint  Patient presents with  . Medical Management of Chronic Issues    4 month follow-up , no concerns    No Known Allergies  HPI 80 y/o male patient is here with his daughter [name] for routine follow up visit. He has no concerns this visit. He is getting his dialysis 3 days a week. He lives at [place] at independent living facility. no falls reported. No new skin concern. Able to feed independently, needs assistance with shower and changing clothes. Is toileting independently.   Review of Systems   Constitutional: Negative for fever, chills, activity change and appetite change.   HENT: Negative for congestion.    Eyes: Negative for discharge.   Respiratory: Negative for cough and shortness of breath.    Cardiovascular: Negative for chest pain, palpitations and leg swelling.   Gastrointestinal: Negative for abdominal pain.   Endocrine: Negative for cold intolerance.   Genitourinary: Negative for dysuria and flank pain.   Musculoskeletal: Negative for back pain, joint pain and joint swelling.   Allergic/Immunologic: Negative for environmental allergies.   Neurological: Negative for dizziness, syncope, weakness, light-headedness, numbness and headaches.   Hematological: Negative for adenopathy.   Psychiatric/Behavioral: Negative for behavioral problems and agitation. No insomnia  Medication reviewed. See [facility]  Physical exam BP 118/66  Pulse 70  Temp(Src) 98.9 F (37.2 C) (Oral)  Wt 166 lb (75.297 kg)  SpO2 97%  Constitutional: He is oriented to person, place, and time. He appears well-developed and well-nourished. No distress.   HENT:   Head: Normocephalic and atraumatic.   Nose: Nose normal.   Mouth/Throat: Oropharynx is clear and moist. No oropharyngeal exudate.   Eyes: Conjunctivae and EOM are normal. Pupils are equal, round, and reactive to light. Right eye exhibits no  discharge. Left eye exhibits no discharge. No scleral icterus.   Neck: Normal range of motion. Neck supple. No tracheal deviation present. No thyromegaly present.   Cardiovascular: Normal rate, regular rhythm and normal heart sounds.    Pulmonary/Chest: Effort normal and breath sounds normal. No respiratory distress.   Abdominal: Soft. Bowel sounds are normal. He exhibits no distension. There is no tenderness.   Musculoskeletal: Normal range of motion. He exhibits no edema and no tenderness.  Lymphadenopathy:    He has no cervical adenopathy.  Neurological: He is alert and oriented to person, place, and time. He has normal reflexes. No cranial nerve deficit.   Skin: Skin is warm and dry. No rash noted. He is not diaphoretic. Left upper arm fistula for dialysis Psychiatric: He has a normal mood and affect. His behavior is normal.   Labs- From jan 2015 reviewed  Assessment/plan  1. Chronic systolic congestive heart failure Weight has been stable, on dialysis. Continue with coreg on dialysis days. Continue aspirin and statin  2. HTN (hypertension) Well controlled bp reading with coreg  3. ESRD on dialysis HD 3 days a week. Continue hectorol and calcium acetate for now. Follow with renal.   4. Hyperlipidemia Continue zocor 5 mg daily for now  5. Tobacco abuse Continues to soke 1-2 cigerretes few days a week. Not willing to quit  Labs- cbc, cmp, lipid, vit d ordered with next dialysis and result to be faxed to office  F/u in 6 month for CPE

## 2013-07-03 ENCOUNTER — Ambulatory Visit: Payer: 59

## 2013-07-28 IMAGING — CR DG CHEST 2V
2 series · 2 of 2 positions shown · non-contrast
Comparison: 02/18/2011 and 11/26/2006.

CLINICAL DATA: Cough, fatigue and chest congestion.

CHEST - 2 VIEW

[PA]
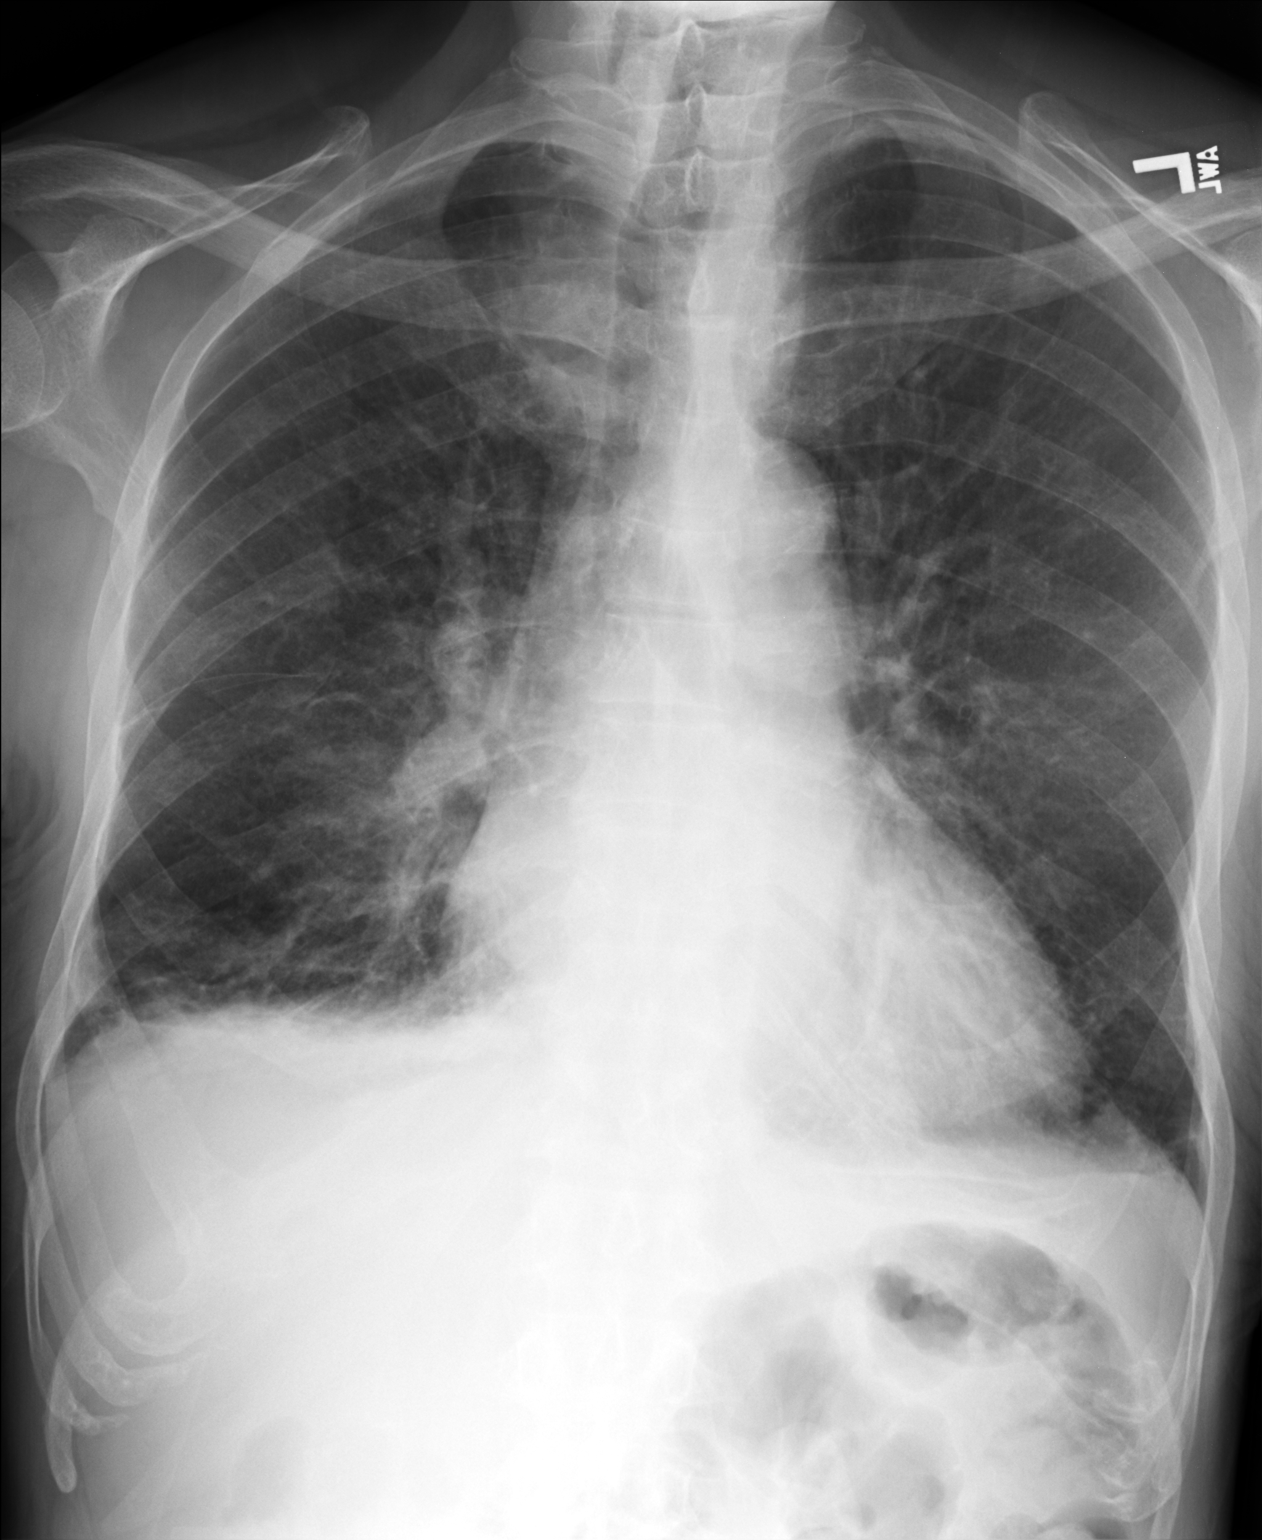

[lateral]
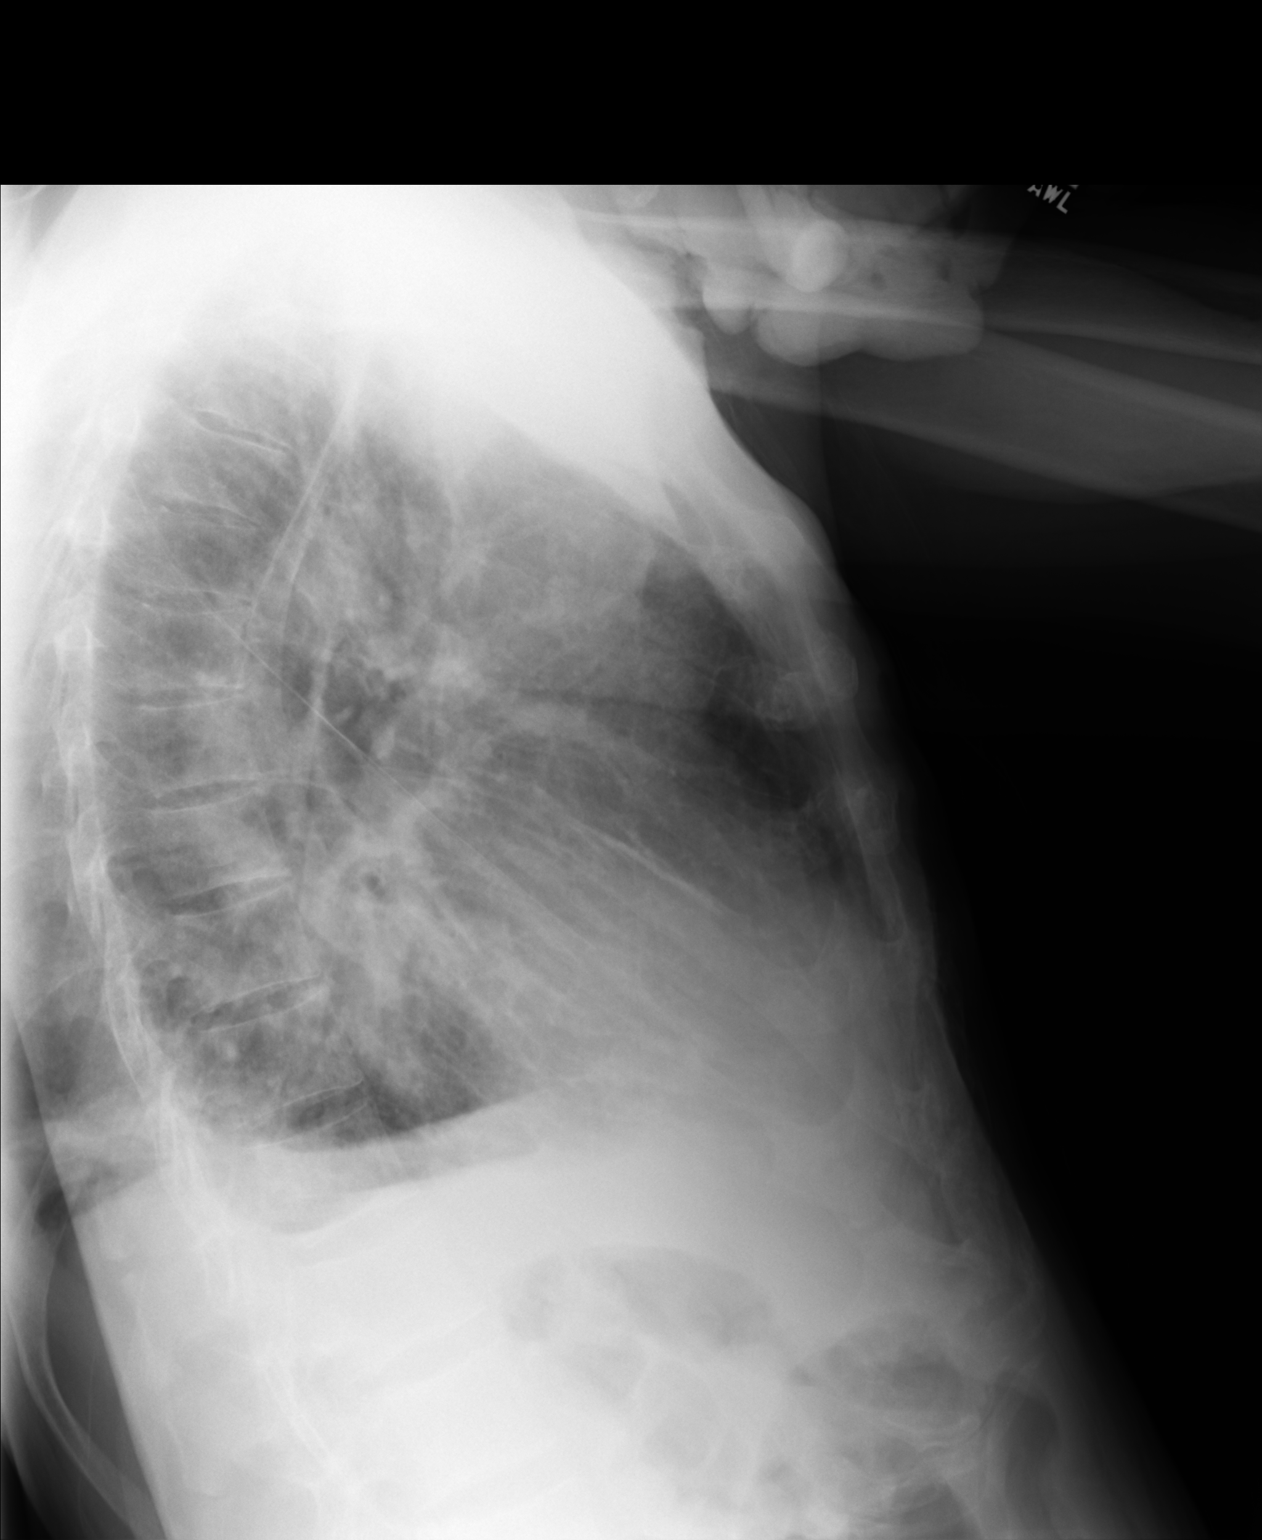

[2 of 2 positions shown; findings below may reference images not displayed]

FINDINGS: Trachea is midline.  Heart size stable.  There has been
interval improvement in bilateral air space disease, with residual
mild volume loss at the lung bases and right infrahilar patchy
opacification.  Small bilateral pleural effusions.  Biapical
pleural thickening.
IMPRESSION: Improving pulmonary edema with residual right infrahilar and
bibasilar air space opacification.  Small bilateral pleural
effusions.

## 2013-08-21 ENCOUNTER — Ambulatory Visit (INDEPENDENT_AMBULATORY_CARE_PROVIDER_SITE_OTHER): Payer: 59

## 2013-08-21 DIAGNOSIS — M79609 Pain in unspecified limb: Secondary | ICD-10-CM

## 2013-08-21 DIAGNOSIS — B351 Tinea unguium: Secondary | ICD-10-CM

## 2013-08-21 DIAGNOSIS — M79606 Pain in leg, unspecified: Secondary | ICD-10-CM

## 2013-08-21 DIAGNOSIS — Q828 Other specified congenital malformations of skin: Secondary | ICD-10-CM

## 2013-08-21 NOTE — Patient Instructions (Signed)

## 2013-08-21 NOTE — Progress Notes (Signed)
   Subjective:    Patient ID: [name], male    DOB: 19-Jan-1925, 80 y.o.   MRN: [id number]  HPI Pt presents for nail debridement   Review of Systems no new findings or systemic changes     Objective:   Physical Exam 80 year old [place] American male presents this time for followup well-developed well-nourished oriented is not endotracheal wheelchair does have renal failure with history dialysis for several years renal dialysis 3 times a week history of hypertension and does chronic kidney disease. At this time does have pedal pulses palpable DP plus one over 4 PT nonpalpable bilateral plus one at +2 edema bilateral extremities with hyperpigmentation venous stasis changes consistent with history of edema. There is mild keratoses distal left hallux nails thick brittle crumbly friable gratified with discoloration tenderness both on palpation with enclosed shoe wear and ambulation. No open wounds no secondary infections no discharge or drainage noted significant decreased muscle strength noted both lower extremities.       Assessment & Plan:  Assessment this time is onychomycosis painful mycotic nails 1 through 5 bilateral debridement at this time continue with appropriate palliative nail care in 3 months for an as-needed basis contact me change or exacerbations or difficulties occur at any time.  Harriet Masson DP

## 2013-11-07 ENCOUNTER — Other Ambulatory Visit: Payer: Self-pay | Admitting: Nurse Practitioner

## 2013-11-27 ENCOUNTER — Ambulatory Visit: Payer: 59

## 2013-12-04 ENCOUNTER — Encounter: Payer: 59 | Admitting: Internal Medicine

## 2013-12-04 ENCOUNTER — Ambulatory Visit: Payer: 59

## 2013-12-05 ENCOUNTER — Ambulatory Visit (INDEPENDENT_AMBULATORY_CARE_PROVIDER_SITE_OTHER): Payer: 59 | Admitting: Internal Medicine

## 2013-12-05 ENCOUNTER — Encounter: Payer: Self-pay | Admitting: Internal Medicine

## 2013-12-05 VITALS — BP 154/68 | HR 78 | Temp 98.1°F | Resp 18 | Ht 73.0 in | Wt 166.0 lb

## 2013-12-05 DIAGNOSIS — I5022 Chronic systolic (congestive) heart failure: Secondary | ICD-10-CM

## 2013-12-05 DIAGNOSIS — I10 Essential (primary) hypertension: Secondary | ICD-10-CM

## 2013-12-05 DIAGNOSIS — J849 Interstitial pulmonary disease, unspecified: Secondary | ICD-10-CM

## 2013-12-05 MED ORDER — AZITHROMYCIN 250 MG PO TABS: ORAL_TABLET | ORAL | Status: DC

## 2013-12-05 MED ORDER — DM-GUAIFENESIN ER 30-600 MG PO TB12: 1.0000 | ORAL_TABLET | Freq: Two times a day (BID) | ORAL | Status: DC

## 2013-12-05 NOTE — Progress Notes (Signed)
Patient ID: [name], male   DOB: 1925/07/22, 80 y.o.   MRN: [id number]    Chief Complaint  Patient presents with  . Acute Visit    abnormal cxr, cough   No Known Allergies  HPI 80 y/o male pt is here for acute visit. He was noted by staff at [place] to have productive cough. He had cxr with findings s/o vascular congestion vs PNA. He is here with his family member. He complaints of ongoing cough with phlegm and some chest congestion. His breathing is stable post dialysis. He denies any fever. He has occasional chills. Denies leg swelling  ROS No fever or diaphoresis Gets HD 3 days a week Denies chest pain, wheezing, dyspnea, palpitations Denies bowel or bladder complaints  Past Medical History  Diagnosis Date  . Hypertension   . Chronic kidney disease   . CHF (congestive heart failure)   . BPH (benign prostatic hyperplasia)   . Prostate cancer   . Anemia   . Hyperlipidemia   . Dementia in conditions classified elsewhere without behavioral disturbance    Current Outpatient Prescriptions on File Prior to Visit  Medication Sig Dispense Refill  . AMBULATORY NON FORMULARY MEDICATION Lab Order Lipid/CBCD/CMP/Vit D 272.4/585.6/ 1 each 0  . aspirin 81 MG tablet Take 81 mg by mouth daily.    . calcium acetate, Phos Binder, (PHOSLYRA) 667 MG/5ML SOLN Take by mouth 3 (three) times daily with meals.    Marland Kitchen doxercalciferol (HECTOROL) 0.5 MCG capsule Take two tablets by mouth three times weekly at diayslis    . ipratropium-albuterol (DUONEB) 0.5-2.5 (3) MG/3ML SOLN Take 3 mLs by nebulization every 6 (six) hours as needed. 360 mL 0  . metoprolol tartrate (LOPRESSOR) 25 MG tablet Take 1/2 tablet on Sunday, Tuesday, Thursday and Saturday. Take 1/2 tablet by mouth at bedtime on Monday, Wednesday and Friday for blood pressure    . Multiple Vitamins-Minerals (CERTA-VITE SENIOR-LUTEIN PO) Take 1 tablet by mouth. Take 1 tablet (4-300-250)by mouth every day    . simvastatin (ZOCOR) 5 MG  tablet Take 2 tablets (10 mg total) by mouth 1 day or 1 dose. 90 tablet 3   No current facility-administered medications on file prior to visit.   Physical exam BP 154/68 mmHg  Pulse 78  Temp(Src) 98.1 F (36.7 C) (Oral)  Resp 18  Ht 6\' 1"  (1.854 m)  Wt 166 lb (75.297 kg)  BMI 21.91 kg/m2  SpO2 96%  Repeat bp check 142/68  Wt Readings from Last 3 Encounters:  12/05/13 166 lb (75.297 kg)  06/05/13 166 lb (75.297 kg)  02/06/13 164 lb 8 oz (74.617 kg)   Constitutional: He is oriented to person, place, and time. He appears well-developed and well-nourished. No distress.   HENT:   Head: Normocephalic and atraumatic.   Nose: Nose normal.   Mouth/Throat: Oropharynx is clear and moist. No oropharyngeal exudate.   Eyes: Conjunctivae and EOM are normal. Pupils are equal, round, and reactive to light. Right eye exhibits no discharge. Left eye exhibits no discharge. No scleral icterus.   Neck: Normal range of motion. Neck supple. No tracheal deviation present. No thyromegaly present.   Cardiovascular: Normal rate, regular rhythm and normal heart sounds.    Pulmonary/Chest: Effort normal. Decreased breath sounds with basilar crackle. No use of accessory muscles. No wheeze or rhonchi.   Abdominal: Soft. Bowel sounds are normal. He exhibits no distension. There is no tenderness.   Musculoskeletal: Normal range of motion. He exhibits no edema and no  tenderness.  Lymphadenopathy: He has no cervical adenopathy.  Neurological: He is alert and oriented. Skin: Skin is warm and dry. No rash noted. He is not diaphoretic. Left upper arm fistula for dialysis Psychiatric: He has a normal mood and affect. His behavior is normal.   Labs- 12/04/13 wbc 8.6, hb 11.4, hct 35.6, plt 172, na 142, k 4.6, glu 79, bun 48, cr 8.65, ca 9.5  imaging 12/01/13 cxr- borderline cardiomegaly, mild central pulmonary vascular congestion with question of chf, possible interstital pneumonia or  pneumonitis   Assessment/plan  1. Pneumonia, interstitial Start zpack 5 days course to treat his pneumonia. Start mucinex DM for now. Reassess if no improvement  2. Chronic systolic congestive heart failure euvolemic on exam. Minimal basilar crackles. On HD. Continue lopressor  3. Essential hypertension Continue lopressor and aspirin

## 2013-12-18 ENCOUNTER — Ambulatory Visit: Payer: 59

## 2013-12-20 ENCOUNTER — Encounter (HOSPITAL_COMMUNITY): Payer: Self-pay | Admitting: Surgery

## 2013-12-20 ENCOUNTER — Ambulatory Visit (INDEPENDENT_AMBULATORY_CARE_PROVIDER_SITE_OTHER): Payer: 59 | Admitting: Podiatry

## 2013-12-20 DIAGNOSIS — B351 Tinea unguium: Secondary | ICD-10-CM

## 2013-12-20 DIAGNOSIS — M79673 Pain in unspecified foot: Secondary | ICD-10-CM

## 2013-12-22 NOTE — Progress Notes (Signed)
Subjective:     Patient ID: [name], male   DOB: 1925/04/26, 80 y.o.   MRN: [id number]  HPI patient presents with painful nailbeds 1-5 both feet that are thick and hard for her to cut at this time   Review of Systems     Objective:   Physical Exam Neurovascular status unchanged with thick yellow brittle nailbeds 1-5 both feet that are painful    Assessment:     Mycotic nail infection with pain 1-5 both feet    Plan:     Debride painful nailbeds 1-5 both feet with no iatrogenic bleeding noted

## 2013-12-24 ENCOUNTER — Encounter: Payer: Self-pay | Admitting: Internal Medicine

## 2014-02-28 ENCOUNTER — Ambulatory Visit
Admission: RE | Admit: 2014-02-28 | Discharge: 2014-02-28 | Disposition: A | Discharge status: Home / Self Care | Payer: PRIVATE HEALTH INSURANCE | Source: Ambulatory Visit | Attending: Nephrology | Admitting: Nephrology

## 2014-02-28 ENCOUNTER — Other Ambulatory Visit: Payer: Self-pay | Admitting: Nephrology

## 2014-02-28 DIAGNOSIS — R188 Other ascites: Secondary | ICD-10-CM

## 2014-03-05 ENCOUNTER — Encounter: Payer: Self-pay | Admitting: Internal Medicine

## 2014-05-30 ENCOUNTER — Telehealth: Payer: Self-pay | Admitting: *Deleted

## 2014-05-30 NOTE — Telephone Encounter (Signed)
Received FL2 Form fax from [facility] requesting Dr. Serena Croissant. Patient has no upcoming appointment and has canceled several appointment. Faxed back that patient needs an appointment before signature can be given.

## 2014-06-04 ENCOUNTER — Encounter: Payer: Self-pay | Admitting: Internal Medicine

## 2014-06-05 ENCOUNTER — Encounter: Payer: Self-pay | Admitting: Internal Medicine

## 2014-06-18 ENCOUNTER — Encounter: Payer: Self-pay | Admitting: Nurse Practitioner

## 2014-06-18 ENCOUNTER — Ambulatory Visit (INDEPENDENT_AMBULATORY_CARE_PROVIDER_SITE_OTHER): Payer: Medicare Other | Admitting: Nurse Practitioner

## 2014-06-18 VITALS — BP 136/66 | HR 70 | Temp 98.1°F | Resp 20 | Ht 73.0 in | Wt 165.8 lb

## 2014-06-18 DIAGNOSIS — E785 Hyperlipidemia, unspecified: Secondary | ICD-10-CM

## 2014-06-18 DIAGNOSIS — I10 Essential (primary) hypertension: Secondary | ICD-10-CM

## 2014-06-18 DIAGNOSIS — J449 Chronic obstructive pulmonary disease, unspecified: Secondary | ICD-10-CM | POA: Diagnosis not present

## 2014-06-18 DIAGNOSIS — N186 End stage renal disease: Secondary | ICD-10-CM

## 2014-06-18 NOTE — Progress Notes (Signed)
Patient ID: [name], male   DOB: 29-Jan-1925, 81 y.o.   MRN: [id number]    PCP: Gildardo Cranker, DO  No Known Allergies  Chief Complaint  Patient presents with  . OTHER    ing a form for his medication  . Medical Management of Chronic Issues     HPI: Patient is a 81 y.o. male seen in the office today for routine follow up and FL2 form. Pt with a pmh ESRD on dialysis 3 days a week, BPH, CHF, anemia, dementia, hyperlipidemia.  Reports everything going good, doing well. No complaints today.     Advanced Directive information Does patient have an advance directive?: Yes Review of Systems:  Review of Systems  Constitutional: Negative for fever and chills.  Respiratory: Negative for cough and shortness of breath.   Cardiovascular: Negative for chest pain and palpitations.  Gastrointestinal: Negative for diarrhea and constipation.  Genitourinary: Negative for dysuria.       Dialysis MWF  Musculoskeletal: Negative for myalgias and arthralgias.  Neurological: Negative for weakness.  Psychiatric/Behavioral: The patient is not nervous/anxious.        Some STML     Past Medical History  Diagnosis Date  . Hypertension   . Chronic kidney disease   . CHF (congestive heart failure)   . BPH (benign prostatic hyperplasia)   . Prostate cancer   . Anemia   . Hyperlipidemia   . Dementia in conditions classified elsewhere without behavioral disturbance    Past Surgical History  Procedure Laterality Date  . Prostate biopsy  04/01/2000  . Av fistula placement Left 08/11/2009    Dr Sherren Mocha Early  . Prostatectomy    . Cystoscopy  06/21/2003  . Shuntogram N/A 02/08/2012    Procedure: Earney Mallet;  Surgeon: Serafina Mitchell, MD;  Location: [facility] CATH LAB;  Service: Cardiovascular;  Laterality: N/A;   Social History:   reports that he quit smoking about 2 years ago. His smoking use included Cigarettes. He has a 15 pack-year smoking history. He has never used smokeless tobacco. He reports that he  does not drink alcohol or use illicit drugs.  Family History  Problem Relation Age of Onset  . Stroke Father   . Early death Sister     Some type of accident  . Cancer Sister     Throat  . Diabetes Sister   . Hypertension Sister   . Dementia Sister     Medications: Patient's Medications  New Prescriptions   No medications on file  Previous Medications   ASPIRIN 81 MG TABLET    Take 81 mg by mouth daily.   CALCIUM ACETATE, PHOS BINDER, (PHOSLYRA) 667 MG/5ML SOLN    Take by mouth 3 (three) times daily with meals.   DEXTROMETHORPHAN-GUAIFENESIN (MUCINEX DM) 30-600 MG PER 12 HR TABLET    Take 1 tablet by mouth 2 (two) times daily.   DOXERCALCIFEROL (HECTOROL) 0.5 MCG CAPSULE    Take two tablets by mouth three times weekly at diayslis   IPRATROPIUM-ALBUTEROL (DUONEB) 0.5-2.5 (3) MG/3ML SOLN    Take 3 mLs by nebulization every 6 (six) hours as needed.   METOPROLOL TARTRATE (LOPRESSOR) 25 MG TABLET    Take 1/2 tablet on Sunday, Tuesday, Thursday and Saturday. Take 1/2 tablet by mouth at bedtime on Monday, Wednesday and Friday for blood pressure   MULTIPLE VITAMINS-MINERALS (CERTA-VITE SENIOR-LUTEIN PO)    Take 1 tablet by mouth. Take 1 tablet (4-300-250)by mouth every day   SIMVASTATIN (ZOCOR) 5 MG TABLET  Take 2 tablets (10 mg total) by mouth 1 day or 1 dose.  Modified Medications   No medications on file  Discontinued Medications   AMBULATORY NON FORMULARY MEDICATION    Lab Order Lipid/CBCD/CMP/Vit D 272.4/585.6/   AZITHROMYCIN (ZITHROMAX) 250 MG TABLET    Take 2 tablets today and then one tablet daily from tomorrow for 4 days     Physical Exam:  Filed Vitals:   06/18/14 1047  BP: 136/66  Pulse: 70  Temp: 98.1 F (36.7 C)  TempSrc: Oral  Resp: 20  Height: 6\' 1"  (1.854 m)  Weight: 165 lb 12.8 oz (75.206 kg)  SpO2: 95%    Physical Exam  Constitutional: He is oriented to person, place, and time. He appears well-developed and well-nourished. No distress.  HENT:  Head:  Normocephalic and atraumatic.  Mouth/Throat: Oropharynx is clear and moist. No oropharyngeal exudate.  Eyes: Conjunctivae and EOM are normal. Pupils are equal, round, and reactive to light.  Neck: Normal range of motion. Neck supple.  Cardiovascular: Normal rate, regular rhythm and normal heart sounds.   Left upper arm fistula for dialysis  Pulmonary/Chest: Effort normal and breath sounds normal.  Abdominal: Soft. Bowel sounds are normal.  Musculoskeletal: He exhibits no edema or tenderness.  Neurological: He is alert and oriented to person, place, and time.  Skin: Skin is warm and dry. He is not diaphoretic.  Psychiatric: He has a normal mood and affect.    Labs reviewed: Basic Metabolic Panel: No results for input(s): NA, K, CL, CO2, GLUCOSE, BUN, CREATININE, CALCIUM, MG, PHOS, TSH in the last 8760 hours. Liver Function Tests: No results for input(s): AST, ALT, ALKPHOS, BILITOT, PROT, ALBUMIN in the last 8760 hours. No results for input(s): LIPASE, AMYLASE in the last 8760 hours. No results for input(s): AMMONIA in the last 8760 hours. CBC: No results for input(s): WBC, NEUTROABS, HGB, HCT, MCV, PLT in the last 8760 hours. Lipid Panel: No results for input(s): CHOL, HDL, LDLCALC, TRIG, CHOLHDL, LDLDIRECT in the last 8760 hours. TSH: No results for input(s): TSH in the last 8760 hours. A1C: Lab Results  Component Value Date   HGBA1C  11/26/2006    5.3 (NOTE)   The ADA recommends the following therapeutic goals for glycemic   control related to Hgb A1C measurement:   Goal of Therapy:   < 7.0% Hgb A1C   Action Suggested:  > 8.0% Hgb A1C   Ref:  Diabetes Care, 22, Suppl. 1, 1999     Assessment/Plan 1. Essential hypertension -controlled on current regimen, to cont lopressor  2. End stage renal disease -conts on dialysis M,W, F  -cont hectorol and calcium acetate    3. Hyperlipidemia -cont on zocor, not fasting today, will make early am appt for next visit to get fasting labs    4. Chronic obstructive pulmonary disease, unspecified COPD, unspecified chronic bronchitis type conts to smoke but without acute exacerbation in COPD  FL2 form signed, will have Dr Eulas Post review and sign as well To follow up in 3 months for routine follow up.

## 2014-06-18 NOTE — Patient Instructions (Signed)
Follow up with Dr Eulas Post in 3 months

## 2014-06-20 ENCOUNTER — Telehealth: Payer: Self-pay

## 2014-06-20 NOTE — Telephone Encounter (Signed)
Message left on triage voicemail, please call to give status update on FL2.   I called Dr.Carter on her cell to see if she recalls signing a FL2 on this patient, FL2 was signed and placed in triage pick-up bin located in the doctors charting area.  Form was located and faxed to [phone].  I called Levada Dy to let her know form was faxed, Levada Dy verified that she received form.

## 2014-08-22 ENCOUNTER — Encounter: Payer: Self-pay | Admitting: Podiatry

## 2014-08-22 ENCOUNTER — Ambulatory Visit (INDEPENDENT_AMBULATORY_CARE_PROVIDER_SITE_OTHER): Payer: Medicare Other | Admitting: Podiatry

## 2014-08-22 DIAGNOSIS — M79673 Pain in unspecified foot: Secondary | ICD-10-CM | POA: Diagnosis not present

## 2014-08-22 DIAGNOSIS — B351 Tinea unguium: Secondary | ICD-10-CM

## 2014-08-22 NOTE — Progress Notes (Signed)
Patient ID: [name], male   DOB: 1925-05-02, 81 y.o.   MRN: [id number] Complaint:  Visit Type: Patient returns to my office for continued preventative foot care services. Complaint: Patient states" my nails have grown long and thick and become painful to walk and wear shoes.. The patient presents for preventative foot care services. No changes to ROS  Podiatric Exam: Vascular: dorsalis pedis and posterior tibial pulses are not  palpable bilateral. Capillary return is immediate. Cold feet noted.  Sensorium: Normal Semmes Weinstein monofilament test. Normal tactile sensation bilaterally. Nail Exam: Pt has thick disfigured discolored nails with subungual debris noted bilateral entire nail hallux through fifth toenails Ulcer Exam: There is no evidence of ulcer or pre-ulcerative changes or infection. Orthopedic Exam: Muscle tone and strength are WNL. No limitations in general ROM. No crepitus or effusions noted. Foot type and digits show no abnormalities. Bony prominences are unremarkable. Skin: No Porokeratosis. No infection or ulcers  Diagnosis:  Onychomycosis, , Pain in right toe, pain in left toes  Treatment & Plan Procedures and Treatment: Consent by patient was obtained for treatment procedures. The patient understood the discussion of treatment and procedures well. All questions were answered thoroughly reviewed. Debridement of mycotic and hypertrophic toenails, 1 through 5 bilateral and clearing of subungual debris. No ulceration, no infection noted.  Return Visit-Office Procedure: Patient instructed to return to the office for a follow up visit 3 months for continued evaluation and treatment.

## 2014-09-19 ENCOUNTER — Ambulatory Visit: Payer: Medicare Other | Admitting: Nurse Practitioner

## 2014-09-24 ENCOUNTER — Encounter: Payer: Self-pay | Admitting: Nurse Practitioner

## 2014-09-24 ENCOUNTER — Ambulatory Visit (INDEPENDENT_AMBULATORY_CARE_PROVIDER_SITE_OTHER): Payer: Medicare Other | Admitting: Nurse Practitioner

## 2014-09-24 VITALS — BP 120/62 | HR 71 | Temp 98.2°F | Resp 14 | Ht 73.0 in | Wt 169.0 lb

## 2014-09-24 DIAGNOSIS — J449 Chronic obstructive pulmonary disease, unspecified: Secondary | ICD-10-CM | POA: Diagnosis not present

## 2014-09-24 DIAGNOSIS — I5022 Chronic systolic (congestive) heart failure: Secondary | ICD-10-CM

## 2014-09-24 DIAGNOSIS — E785 Hyperlipidemia, unspecified: Secondary | ICD-10-CM | POA: Diagnosis not present

## 2014-09-24 DIAGNOSIS — N186 End stage renal disease: Secondary | ICD-10-CM | POA: Diagnosis not present

## 2014-09-24 DIAGNOSIS — I10 Essential (primary) hypertension: Secondary | ICD-10-CM

## 2014-09-24 NOTE — Progress Notes (Signed)
Patient ID: [name], male   DOB: October 16, 1925, 81 y.o.   MRN: [id number]    PCP: Lauree Chandler, NP  No Known Allergies  Chief Complaint  Patient presents with  . Medical Management of Chronic Issues    3 month follow-up, not fasting today    . Immunizations    Will get flu vaccine at dialysis center      HPI: Patient is a 81 y.o. male seen in the office today for routine follow up. Pt with a pmh of ESRD on dialysis 3 days a week, BPH, CHF, anemia, dementia, hyperlipidemia. dialysis Monday, Wednesday, fridays.  Lives at [facility] in the assisted living.  Eating well. Good appetite.  No shortness of breath, cough or congestion. No swelling. No constipation.  Does urinate some.  Mood has been good, no depression or anxiety Sleeps well at night.   Advanced Directive information Does patient have an advance directive?: Yes, Does patient want to make changes to advanced directive?: Yes - information given Review of Systems:  Review of Systems  Constitutional: Negative for fever and chills.  Respiratory: Negative for cough and shortness of breath.   Cardiovascular: Negative for chest pain, palpitations and leg swelling.  Gastrointestinal: Negative for diarrhea, constipation and abdominal distention.  Genitourinary: Negative for dysuria.       Dialysis MWF  Musculoskeletal: Negative for myalgias and arthralgias.  Neurological: Negative for weakness.  Psychiatric/Behavioral: Negative for confusion. The patient is not nervous/anxious.        Some STML     Past Medical History  Diagnosis Date  . Hypertension   . Chronic kidney disease   . CHF (congestive heart failure)   . BPH (benign prostatic hyperplasia)   . Prostate cancer   . Anemia   . Hyperlipidemia   . Dementia in conditions classified elsewhere without behavioral disturbance    Past Surgical History  Procedure Laterality Date  . Prostate biopsy  04/01/2000  . Av fistula placement Left 08/11/2009    Dr  Sherren Mocha Early  . Prostatectomy    . Cystoscopy  06/21/2003  . Shuntogram N/A 02/08/2012    Procedure: Earney Mallet;  Surgeon: Serafina Mitchell, MD;  Location: [facility] CATH LAB;  Service: Cardiovascular;  Laterality: N/A;   Social History:   reports that he has been smoking Cigarettes.  He has a 15 pack-year smoking history. He has never used smokeless tobacco. He reports that he does not drink alcohol or use illicit drugs.  Family History  Problem Relation Age of Onset  . Stroke Father   . Early death Sister     Some type of accident  . Cancer Sister     Throat  . Diabetes Sister   . Hypertension Sister   . Dementia Sister     Medications: Patient's Medications  New Prescriptions   No medications on file  Previous Medications   ASPIRIN 81 MG TABLET    Take 81 mg by mouth daily.   CALCIUM ACETATE, PHOS BINDER, (PHOSLYRA) 667 MG/5ML SOLN    Take by mouth 3 (three) times daily with meals.   DEXTROMETHORPHAN-GUAIFENESIN (MUCINEX DM) 30-600 MG PER 12 HR TABLET    Take 1 tablet by mouth 2 (two) times daily.   DOXERCALCIFEROL (HECTOROL) 0.5 MCG CAPSULE    Take two tablets by mouth three times weekly at diayslis   IPRATROPIUM-ALBUTEROL (DUONEB) 0.5-2.5 (3) MG/3ML SOLN    Take 3 mLs by nebulization every 6 (six) hours as needed.   METOPROLOL TARTRATE (  LOPRESSOR) 25 MG TABLET    Take 1/2 tablet on Sunday, Tuesday, Thursday and Saturday. Take 1/2 tablet by mouth at bedtime on Monday, Wednesday and Friday for blood pressure   MULTIPLE VITAMINS-MINERALS (CERTA-VITE SENIOR-LUTEIN PO)    Take 1 tablet by mouth. Take 1 tablet (4-300-250)by mouth every day   SIMVASTATIN (ZOCOR) 5 MG TABLET    Take 2 tablets (10 mg total) by mouth 1 day or 1 dose.  Modified Medications   No medications on file  Discontinued Medications   No medications on file     Physical Exam:  Filed Vitals:   09/24/14 1439  BP: 120/62  Pulse: 71  Temp: 98.2 F (36.8 C)  TempSrc: Oral  Resp: 14  Height: 6\' 1"  (1.854 m)    Weight: 169 lb (76.658 kg)  SpO2: 97%    Physical Exam  Constitutional: He is oriented to person, place, and time. He appears well-developed and well-nourished. No distress.  HENT:  Head: Normocephalic and atraumatic.  Mouth/Throat: Oropharynx is clear and moist. No oropharyngeal exudate.  Eyes: Conjunctivae and EOM are normal. Pupils are equal, round, and reactive to light.  Neck: Normal range of motion. Neck supple.  Cardiovascular: Normal rate, regular rhythm and normal heart sounds.   Left upper arm fistula for dialysis  Pulmonary/Chest: Effort normal and breath sounds normal.  Abdominal: Soft. Bowel sounds are normal.  Musculoskeletal: He exhibits no edema or tenderness.  Neurological: He is alert and oriented to person, place, and time.  Skin: Skin is warm and dry. He is not diaphoretic.  Psychiatric: He has a normal mood and affect.    Labs reviewed: Basic Metabolic Panel: No results for input(s): NA, K, CL, CO2, GLUCOSE, BUN, CREATININE, CALCIUM, MG, PHOS, TSH in the last 8760 hours. Liver Function Tests: No results for input(s): AST, ALT, ALKPHOS, BILITOT, PROT, ALBUMIN in the last 8760 hours. No results for input(s): LIPASE, AMYLASE in the last 8760 hours. No results for input(s): AMMONIA in the last 8760 hours. CBC: No results for input(s): WBC, NEUTROABS, HGB, HCT, MCV, PLT in the last 8760 hours. Lipid Panel: No results for input(s): CHOL, HDL, LDLCALC, TRIG, CHOLHDL, LDLDIRECT in the last 8760 hours. TSH: No results for input(s): TSH in the last 8760 hours. A1C: Lab Results  Component Value Date   HGBA1C  11/26/2006    5.3 (NOTE)   The ADA recommends the following therapeutic goals for glycemic   control related to Hgb A1C measurement:   Goal of Therapy:   < 7.0% Hgb A1C   Action Suggested:  > 8.0% Hgb A1C   Ref:  Diabetes Care, 22, Suppl. 1, 1999     Assessment/Plan 1. Essential hypertension Blood pressure stable on lopressor   2. End stage renal  disease conts on dialysis M,W, F, and remains on  hectorol and calcium acetate   3. Hyperlipidemia -remains on zocor, will have lipids checked at dialysis and have results faxed to office, not fasting today.   4. Chronic obstructive pulmonary disease, unspecified COPD, unspecified chronic bronchitis type Without recent excerebration, conts on mucinex DM twice daily and duonebs as needed  5. Chronic systolic congestive heart failure euvolemic- conts on beta blocker.   Follow up in 3 months for EV with MMSE dialysis to fax lab work to office   [place] K. Harle Battiest  [facility] & Adult Medicine [phone] 8 am - 5 pm) [phone] (after hours)

## 2014-09-24 NOTE — Patient Instructions (Signed)
Follow up in 3 months for extended visit with MMSE  To get lipid panel (cholesterol) done with labs at dialysis and have results sent to office

## 2014-11-21 ENCOUNTER — Ambulatory Visit: Payer: Medicare Other | Admitting: Podiatry

## 2014-12-12 ENCOUNTER — Ambulatory Visit: Payer: Medicare Other | Admitting: Podiatry

## 2014-12-26 ENCOUNTER — Ambulatory Visit: Payer: Medicare Other | Admitting: Nurse Practitioner

## 2015-01-09 ENCOUNTER — Ambulatory Visit: Payer: Medicare Other | Admitting: Podiatry

## 2015-05-07 ENCOUNTER — Telehealth: Payer: Self-pay | Admitting: *Deleted

## 2015-05-07 NOTE — Telephone Encounter (Signed)
Received an FL2 form from [facility] for Provider to sign and fax back to them at Fax: [phone]. Sent fax back to [facility] stating patient needs an appointment first. Patient has not been seen since 09/2014 and has canceled an follow up appointment.

## 2015-07-07 ENCOUNTER — Ambulatory Visit: Payer: Self-pay | Admitting: Nurse Practitioner

## 2015-07-08 ENCOUNTER — Ambulatory Visit: Payer: Self-pay | Admitting: Nurse Practitioner

## 2015-07-10 ENCOUNTER — Ambulatory Visit (INDEPENDENT_AMBULATORY_CARE_PROVIDER_SITE_OTHER): Payer: Medicare Other | Admitting: Nurse Practitioner

## 2015-07-10 ENCOUNTER — Encounter: Payer: Self-pay | Admitting: Nurse Practitioner

## 2015-07-10 VITALS — BP 124/68 | HR 70 | Temp 98.1°F | Resp 17 | Ht 73.0 in | Wt 162.0 lb

## 2015-07-10 DIAGNOSIS — N186 End stage renal disease: Secondary | ICD-10-CM

## 2015-07-10 DIAGNOSIS — E785 Hyperlipidemia, unspecified: Secondary | ICD-10-CM | POA: Diagnosis not present

## 2015-07-10 DIAGNOSIS — I5022 Chronic systolic (congestive) heart failure: Secondary | ICD-10-CM | POA: Diagnosis not present

## 2015-07-10 DIAGNOSIS — J449 Chronic obstructive pulmonary disease, unspecified: Secondary | ICD-10-CM

## 2015-07-10 DIAGNOSIS — Z992 Dependence on renal dialysis: Secondary | ICD-10-CM | POA: Diagnosis not present

## 2015-07-10 DIAGNOSIS — I10 Essential (primary) hypertension: Secondary | ICD-10-CM

## 2015-07-10 DIAGNOSIS — R413 Other amnesia: Secondary | ICD-10-CM

## 2015-07-10 NOTE — Progress Notes (Signed)
Patient ID: [name], male   DOB: 28-Jun-1925, 82 y.o.   MRN: [id number]    PCP: Lauree Chandler, NP  Advanced Directive information Does patient have an advance directive?: Yes, Type of Advance Directive: [place], Does patient want to make changes to advanced directive?: No - Patient declined  No Known Allergies  Chief Complaint  Patient presents with  . Medical Management of Chronic Issues    Medication management fo cholesterol.      HPI: Patient is a 82 y.o. male seen in the office today for routine follow up. Pt with hx of hyperlipidemia, htn, ESRD on dialysis, CHF, dementia, anemia, BPH  Pt living at [facility].  Here today with daughter. No significant memory loss or change in functional status noted.  Never got cholesterol checked from last visit when Rx was written, pt goes [facility] [address]- fax [phone] Appetite good, eating most of what he is served at [employer].  Pt reports he is doing well.     Review of Systems:  Review of Systems  Constitutional: Negative for fever and chills.  Respiratory: Negative for cough and shortness of breath.   Cardiovascular: Negative for chest pain, palpitations and leg swelling.  Gastrointestinal: Negative for diarrhea, constipation and abdominal distention.  Genitourinary: Negative for dysuria.       Dialysis MWF  Musculoskeletal: Negative for myalgias and arthralgias.  Neurological: Negative for weakness.  Psychiatric/Behavioral: Negative for confusion. The patient is not nervous/anxious.        STML     Past Medical History  Diagnosis Date  . Hypertension   . Chronic kidney disease   . CHF (congestive heart failure) ([place])   . BPH (benign prostatic hyperplasia)   . Prostate cancer ([place])   . Anemia   . Hyperlipidemia   . Dementia in conditions classified elsewhere without behavioral disturbance    Past Surgical History  Procedure Laterality Date  . Prostate biopsy   04/01/2000  . Av fistula placement Left 08/11/2009    Dr Sherren Mocha Early  . Prostatectomy    . Cystoscopy  06/21/2003  . Shuntogram N/A 02/08/2012    Procedure: Earney Mallet;  Surgeon: Serafina Mitchell, MD;  Location: [facility] CATH LAB;  Service: Cardiovascular;  Laterality: N/A;   Social History:   reports that he has been smoking Cigarettes.  He has a 15 pack-year smoking history. He has never used smokeless tobacco. He reports that he does not drink alcohol or use illicit drugs.  Family History  Problem Relation Age of Onset  . Stroke Father   . Early death Sister     Some type of accident  . Cancer Sister     Throat  . Diabetes Sister   . Hypertension Sister   . Dementia Sister     Medications: Patient's Medications  New Prescriptions   No medications on file  Previous Medications   ASPIRIN 81 MG TABLET    Take 81 mg by mouth daily.   CALCIUM ACETATE, PHOS BINDER, (PHOSLYRA) 667 MG/5ML SOLN    Take by mouth 3 (three) times daily with meals.   DEXTROMETHORPHAN-GUAIFENESIN (MUCINEX DM) 30-600 MG PER 12 HR TABLET    Take 1 tablet by mouth 2 (two) times daily.   DOXERCALCIFEROL (HECTOROL) 0.5 MCG CAPSULE    Take two tablets by mouth three times weekly at diayslis   IPRATROPIUM-ALBUTEROL (DUONEB) 0.5-2.5 (3) MG/3ML SOLN    Take 3 mLs by nebulization every 6 (six)  hours as needed.   METOPROLOL TARTRATE (LOPRESSOR) 25 MG TABLET    Take 1/2 tablet on Sunday, Tuesday, Thursday and Saturday. Take 1/2 tablet by mouth at bedtime on Monday, Wednesday and Friday for blood pressure   MULTIPLE VITAMINS-MINERALS (CERTA-VITE SENIOR-LUTEIN PO)    Take 1 tablet by mouth. Take 1 tablet (4-300-250)by mouth every day   SIMVASTATIN (ZOCOR) 5 MG TABLET    Take 2 tablets (10 mg total) by mouth 1 day or 1 dose.  Modified Medications   No medications on file  Discontinued Medications   No medications on file     Physical Exam:  Filed Vitals:   07/10/15 0916  BP: 124/68  Pulse: 70  Temp: 98.1 F (36.7 C)    TempSrc: Oral  Resp: 17  Height: 6\' 1"  (1.854 m)  Weight: 162 lb (73.483 kg)  SpO2: 99%   Body mass index is 21.38 kg/(m^2).  Physical Exam  Constitutional: He is oriented to person, place, and time. He appears well-developed and well-nourished. No distress.  HENT:  Head: Normocephalic and atraumatic.  Mouth/Throat: Oropharynx is clear and moist. No oropharyngeal exudate.  Eyes: Conjunctivae and EOM are normal. Pupils are equal, round, and reactive to light.  Neck: Normal range of motion. Neck supple.  Cardiovascular: Normal rate, regular rhythm and normal heart sounds.   Left upper arm fistula for dialysis  Pulmonary/Chest: Effort normal and breath sounds normal.  Abdominal: Soft. Bowel sounds are normal.  Musculoskeletal: He exhibits no edema or tenderness.  Neurological: He is alert and oriented to person, place, and time.  Skin: Skin is warm and dry. He is not diaphoretic.  Psychiatric: He has a normal mood and affect.    Labs reviewed: Basic Metabolic Panel: No results for input(s): NA, K, CL, CO2, GLUCOSE, BUN, CREATININE, CALCIUM, MG, PHOS, TSH in the last 8760 hours. Liver Function Tests: No results for input(s): AST, ALT, ALKPHOS, BILITOT, PROT, ALBUMIN in the last 8760 hours. No results for input(s): LIPASE, AMYLASE in the last 8760 hours. No results for input(s): AMMONIA in the last 8760 hours. CBC: No results for input(s): WBC, NEUTROABS, HGB, HCT, MCV, PLT in the last 8760 hours. Lipid Panel: No results for input(s): CHOL, HDL, LDLCALC, TRIG, CHOLHDL, LDLDIRECT in the last 8760 hours. TSH: No results for input(s): TSH in the last 8760 hours. A1C: Lab Results  Component Value Date   HGBA1C  11/26/2006    5.3 (NOTE)   The ADA recommends the following therapeutic goals for glycemic   control related to Hgb A1C measurement:   Goal of Therapy:   < 7.0% Hgb A1C   Action Suggested:  > 8.0% Hgb A1C   Ref:  Diabetes Care, 22, Suppl. 1, 1999   Functional Status  Survey: Is the patient deaf or have difficulty hearing?: No Does the patient have difficulty seeing, even when wearing glasses/contacts?: No Does the patient have difficulty concentrating, remembering, or making decisions?: No Does the patient have difficulty walking or climbing stairs?: Yes (uses wheelchair) Does the patient have difficulty dressing or bathing?: No Does the patient have difficulty doing errands alone such as visiting a doctor's office or shopping?: Yes (does not drive)   Assessment/Plan 1. Essential hypertension Blood pressure stable, conts lopressor  2. Chronic systolic congestive heart failure (HCC) Remains stable and euvolemic, conts on betablocker   3. ESRD on dialysis ([place]) conts to follow up with renal, HD MWF  4. Hyperlipidemia Has not had recent lipid panel -conts on zocor, will fax  Rx requesting lipid panel to dialysis center at this time  5. Memory loss Stable, MMSE of 26/30 today  6. Chronic obstructive pulmonary disease, unspecified COPD, unspecified chronic bronchitis type COPD remains stable, no recent flares conts mucinex tablets BID and PRN nebs  Follow up in 6 months, sooner if needed    Glennie Bose K. Harle Battiest  [facility] & Adult Medicine [phone] 8 am - 5 pm) [phone] (after hours)

## 2015-09-18 ENCOUNTER — Ambulatory Visit (INDEPENDENT_AMBULATORY_CARE_PROVIDER_SITE_OTHER): Payer: Medicare Other | Admitting: Podiatry

## 2015-09-18 ENCOUNTER — Encounter: Payer: Self-pay | Admitting: Podiatry

## 2015-09-18 DIAGNOSIS — M79673 Pain in unspecified foot: Secondary | ICD-10-CM | POA: Diagnosis not present

## 2015-09-18 DIAGNOSIS — B351 Tinea unguium: Secondary | ICD-10-CM

## 2015-09-18 NOTE — Progress Notes (Signed)
Patient ID: [name], male   DOB: 1925-09-16, 82 y.o.   MRN: [id number] Complaint:  Visit Type: Patient returns to my office for continued preventative foot care services. Complaint: Patient states" my nails have grown long and thick and become painful to walk and wear shoes.. The patient presents for preventative foot care services. No changes to ROS  Podiatric Exam: Vascular: dorsalis pedis and posterior tibial pulses are not  palpable bilateral. Capillary return is immediate. Cold feet noted.  Sensorium: Normal Semmes Weinstein monofilament test. Normal tactile sensation bilaterally. Nail Exam: Pt has thick disfigured discolored nails with subungual debris noted bilateral entire nail hallux through fifth toenails Ulcer Exam: There is no evidence of ulcer or pre-ulcerative changes or infection. Orthopedic Exam: Muscle tone and strength are WNL. No limitations in general ROM. No crepitus or effusions noted. Foot type and digits show no abnormalities. Bony prominences are unremarkable. Skin: No Porokeratosis. No infection or ulcers  Diagnosis:  Onychomycosis, , Pain in right toe, pain in left toes  Treatment & Plan Procedures and Treatment: Consent by patient was obtained for treatment procedures. The patient understood the discussion of treatment and procedures well. All questions were answered thoroughly reviewed. Debridement of mycotic and hypertrophic toenails, 1 through 5 bilateral and clearing of subungual debris. No ulceration, no infection noted.  Return Visit-Office Procedure: Patient instructed to return to the office for a follow up visit 3 months for continued evaluation and treatment.   Gardiner Barefoot DPM

## 2016-01-15 ENCOUNTER — Ambulatory Visit: Payer: Medicare Other | Admitting: Nurse Practitioner

## 2016-01-15 ENCOUNTER — Ambulatory Visit: Payer: Medicare Other

## 2016-02-05 ENCOUNTER — Ambulatory Visit (INDEPENDENT_AMBULATORY_CARE_PROVIDER_SITE_OTHER): Payer: Medicare Other | Admitting: Nurse Practitioner

## 2016-02-05 ENCOUNTER — Encounter: Payer: Self-pay | Admitting: Nurse Practitioner

## 2016-02-05 ENCOUNTER — Ambulatory Visit (INDEPENDENT_AMBULATORY_CARE_PROVIDER_SITE_OTHER): Payer: Medicare Other

## 2016-02-05 VITALS — BP 178/66 | HR 73 | Temp 98.2°F | Resp 18 | Ht 73.0 in | Wt 170.0 lb

## 2016-02-05 VITALS — BP 180/64 | HR 73 | Temp 98.2°F | Ht 73.0 in | Wt 170.0 lb

## 2016-02-05 DIAGNOSIS — I5022 Chronic systolic (congestive) heart failure: Secondary | ICD-10-CM

## 2016-02-05 DIAGNOSIS — Z0000 Encounter for general adult medical examination without abnormal findings: Secondary | ICD-10-CM

## 2016-02-05 DIAGNOSIS — R413 Other amnesia: Secondary | ICD-10-CM

## 2016-02-05 DIAGNOSIS — Z23 Encounter for immunization: Secondary | ICD-10-CM | POA: Diagnosis not present

## 2016-02-05 DIAGNOSIS — I10 Essential (primary) hypertension: Secondary | ICD-10-CM | POA: Diagnosis not present

## 2016-02-05 DIAGNOSIS — Z992 Dependence on renal dialysis: Secondary | ICD-10-CM

## 2016-02-05 DIAGNOSIS — N186 End stage renal disease: Secondary | ICD-10-CM | POA: Diagnosis not present

## 2016-02-05 DIAGNOSIS — E785 Hyperlipidemia, unspecified: Secondary | ICD-10-CM | POA: Diagnosis not present

## 2016-02-05 NOTE — Patient Instructions (Addendum)
[name] , Thank you for taking time to come for your Medicare Wellness Visit. I appreciate your ongoing commitment to your health goals. Please review the following plan we discussed and let me know if I can assist you in the future.   These are the goals we discussed: Goals    . increase physical activity          Starting 02/05/16, I will attempt to increase my physical activity at [facility].        This is a list of the screening recommended for you and due dates:  Health Maintenance  Topic Date Due  . Shingles Vaccine  08/11/2018*  . Tetanus Vaccine  08/06/2021  . Flu Shot  Addressed  . Pneumonia vaccines  Completed  *Topic was postponed. The date shown is not the original due date.  Preventive Care for Adults  A healthy lifestyle and preventive care can promote health and wellness. Preventive health guidelines for adults include the following key practices.  . A routine yearly physical is a good way to check with your health care provider about your health and preventive screening. It is a chance to share any concerns and updates on your health and to receive a thorough exam.  . Visit your dentist for a routine exam and preventive care every 6 months. Brush your teeth twice a day and floss once a day. Good oral hygiene prevents tooth decay and gum disease.  . The frequency of eye exams is based on your age, health, family medical history, use  of contact lenses, and other factors. Follow your health care provider's ecommendations for frequency of eye exams.  . Eat a healthy diet. Foods like vegetables, fruits, whole grains, low-fat dairy products, and lean protein foods contain the nutrients you need without too many calories. Decrease your intake of foods high in solid fats, added sugars, and salt. Eat the right amount of calories for you. Get information about a proper diet from your health care provider, if necessary.  . Regular physical exercise is one of the most  important things you can do for your health. Most adults should get at least 150 minutes of moderate-intensity exercise (any activity that increases your heart rate and causes you to sweat) each week. In addition, most adults need muscle-strengthening exercises on 2 or more days a week.  Silver Sneakers may be a benefit available to you. To determine eligibility, you may visit the website: www.silversneakers.com or contact program at 775-184-1440 Mon-Fri between 8AM-8PM.   . Maintain a healthy weight. The body mass index (BMI) is a screening tool to identify possible weight problems. It provides an estimate of body fat based on height and weight. Your health care provider can find your BMI and can help you achieve or maintain a healthy weight.   For adults 20 years and older: ? A BMI below 18.5 is considered underweight. ? A BMI of 18.5 to 24.9 is normal. ? A BMI of 25 to 29.9 is considered overweight. ? A BMI of 30 and above is considered obese.   . Maintain normal blood lipids and cholesterol levels by exercising and minimizing your intake of saturated fat. Eat a balanced diet with plenty of fruit and vegetables. Blood tests for lipids and cholesterol should begin at age 54 and be repeated every 5 years. If your lipid or cholesterol levels are high, you are over 50, or you are at high risk for heart disease, you may need your cholesterol levels  checked more frequently. Ongoing high lipid and cholesterol levels should be treated with medicines if diet and exercise are not working.  . If you smoke, find out from your health care provider how to quit. If you do not use tobacco, please do not start.  . If you choose to drink alcohol, please do not consume more than 2 drinks per day. One drink is considered to be 12 ounces (355 mL) of beer, 5 ounces (148 mL) of wine, or 1.5 ounces (44 mL) of liquor.  . If you are 51-74 years old, ask your health care provider if you should take aspirin to prevent  strokes.  . Use sunscreen. Apply sunscreen liberally and repeatedly throughout the day. You should seek shade when your shadow is shorter than you. Protect yourself by wearing long sleeves, pants, a wide-brimmed hat, and sunglasses year round, whenever you are outdoors.  . Once a month, do a whole body skin exam, using a mirror to look at the skin on your back. Tell your health care provider of new moles, moles that have irregular borders, moles that are larger than a pencil eraser, or moles that have changed in shape or color.

## 2016-02-05 NOTE — Progress Notes (Signed)
Careteam: Patient Care Team: Lauree Chandler, NP as PCP - General (Nurse Practitioner)  Advanced Directive information Does Patient Have a Medical Advance Directive?: Yes, Type of Advance Directive: Healthcare Power of Attorney  No Known Allergies  Chief Complaint  Patient presents with  . Medical Management of Chronic Issues    6 month routine follow up     HPI: Patient is a 83 y.o. male seen in the office today for routine follow up. Here with daughter today. hx of hyperlipidemia, htn, ESRD on dialysis, CHF, dementia, anemia, BPH  Blood pressure high today but reports it is lower after dialysis.  Eating well. Good appetite.  Still at [facility] independent living.  Blood pressure this morning was 126/64, generally stays in the 120-140. Lower after dialysis.   Review of Systems:  Review of Systems  Constitutional: Negative for chills and fever.  Respiratory: Negative for cough and shortness of breath.   Cardiovascular: Negative for chest pain, palpitations and leg swelling.  Gastrointestinal: Negative for abdominal distention, constipation and diarrhea.  Genitourinary: Negative for dysuria.       Dialysis MWF  Musculoskeletal: Negative for arthralgias and myalgias.  Neurological: Negative for weakness.  Psychiatric/Behavioral: Negative for confusion. The patient is not nervous/anxious.        STML     Past Medical History:  Diagnosis Date  . Anemia   . BPH (benign prostatic hyperplasia)   . CHF (congestive heart failure) ([place])   . Chronic kidney disease   . Dementia in conditions classified elsewhere without behavioral disturbance   . Hyperlipidemia   . Hypertension   . Prostate cancer [facility])    Past Surgical History:  Procedure Laterality Date  . AV FISTULA PLACEMENT Left 08/11/2009   Dr Sherren Mocha Early  . CYSTOSCOPY  06/21/2003  . PROSTATE BIOPSY  04/01/2000  . PROSTATECTOMY    . SHUNTOGRAM N/A 02/08/2012   Procedure: Earney Mallet;  Surgeon: Serafina Mitchell, MD;  Location: [facility] CATH LAB;  Service: Cardiovascular;  Laterality: N/A;   Social History:   reports that he has been smoking Cigarettes.  He has a 15.00 pack-year smoking history. He has never used smokeless tobacco. He reports that he does not drink alcohol or use drugs.  Family History  Problem Relation Age of Onset  . Stroke Father   . Early death Sister     Some type of accident  . Cancer Sister     Throat  . Diabetes Sister   . Hypertension Sister   . Dementia Sister     Medications: Patient's Medications  New Prescriptions   No medications on file  Previous Medications   ASPIRIN 81 MG TABLET    Take 81 mg by mouth daily.   CALCIUM ACETATE, PHOS BINDER, (PHOSLYRA) 667 MG/5ML SOLN    Take by mouth 3 (three) times daily with meals.   CINACALCET (SENSIPAR) 30 MG TABLET    Take 30 mg by mouth 3 (three) times a week.   DOXERCALCIFEROL (HECTOROL) 0.5 MCG CAPSULE    Take two tablets by mouth three times weekly at diayslis   METOPROLOL TARTRATE (LOPRESSOR) 25 MG TABLET    Take 1/2 tablet on Sunday, Tuesday, Thursday and Saturday. Take 1/2 tablet by mouth at bedtime on Monday, Wednesday and Friday for blood pressure   MULTIPLE VITAMINS-MINERALS (CERTA-VITE SENIOR-LUTEIN PO)    Take 1 tablet by mouth. Take 1 tablet (4-300-250)by mouth every day   RENVELA 800 MG TABLET    Take 3  tablets by mouth 3 (three) times daily with meals.   SIMVASTATIN (ZOCOR) 10 MG TABLET    Take 1 tablet by mouth at bedtime.  Modified Medications   No medications on file  Discontinued Medications   SIMVASTATIN (ZOCOR) 5 MG TABLET    Take 2 tablets (10 mg total) by mouth 1 day or 1 dose.     Physical Exam:  Vitals:   02/05/16 1511  BP: (!) 178/66  Pulse: 73  Resp: 18  Temp: 98.2 F (36.8 C)  TempSrc: Oral  SpO2: 99%  Weight: 170 lb (77.1 kg)  Height: 6\' 1"  (1.854 m)   Body mass index is 22.43 kg/m.  Physical Exam  Constitutional: He is oriented to person, place, and time. He appears  well-developed and well-nourished. No distress.  HENT:  Head: Normocephalic and atraumatic.  Mouth/Throat: Oropharynx is clear and moist. No oropharyngeal exudate.  Eyes: Conjunctivae and EOM are normal. Pupils are equal, round, and reactive to light.  Neck: Normal range of motion. Neck supple.  Cardiovascular: Normal rate, regular rhythm and normal heart sounds.   Left upper arm fistula for dialysis  Pulmonary/Chest: Effort normal and breath sounds normal.  Abdominal: Soft. Bowel sounds are normal.  Musculoskeletal: He exhibits no edema or tenderness.  Neurological: He is alert and oriented to person, place, and time.  Skin: Skin is warm and dry. He is not diaphoretic.  Psychiatric: He has a normal mood and affect.    Labs reviewed: Basic Metabolic Panel: No results for input(s): NA, K, CL, CO2, GLUCOSE, BUN, CREATININE, CALCIUM, MG, PHOS, TSH in the last 8760 hours. Liver Function Tests: No results for input(s): AST, ALT, ALKPHOS, BILITOT, PROT, ALBUMIN in the last 8760 hours. No results for input(s): LIPASE, AMYLASE in the last 8760 hours. No results for input(s): AMMONIA in the last 8760 hours. CBC: No results for input(s): WBC, NEUTROABS, HGB, HCT, MCV, PLT in the last 8760 hours. Lipid Panel: No results for input(s): CHOL, HDL, LDLCALC, TRIG, CHOLHDL, LDLDIRECT in the last 8760 hours. TSH: No results for input(s): TSH in the last 8760 hours. A1C: Lab Results  Component Value Date   HGBA1C  11/26/2006    5.3 (NOTE)   The ADA recommends the following therapeutic goals for glycemic   control related to Hgb A1C measurement:   Goal of Therapy:   < 7.0% Hgb A1C   Action Suggested:  > 8.0% Hgb A1C   Ref:  Diabetes Care, 22, Suppl. 1, 1999     Assessment/Plan 1. Essential hypertension -elevated today however reports home readings within appropriate range and blood pressure drops after dialysis.  To cont current regimen, to notify if blood pressure remaining over 140/90.  To  have facility fax over blood pressure readings for review.   2. Chronic systolic congestive heart failure (HCC) Euvolemic. conts on metoprolol  3. ESRD on dialysis ([place]) Stable, conts on supplements by nephrology   4. Hyperlipidemia, unspecified hyperlipidemia type conts on zocor, not fasting today but over due for lipids - Lipid Panel - Hepatic function panel  5. Memory loss Remains stable, no significant loss in the last year. mmse 25/30 (last year 26/30); weight has been stable.   Carlos American. Harle Battiest  [facility] [phone] 8 am - 5 pm) [phone] (after hours)

## 2016-02-05 NOTE — Progress Notes (Signed)
Quick Notes   Health Maintenance:  Pn23 given today;    Abnormal Screen: None; MMSE-25/30 Failed clock test    Patient Concerns:  None    Nurse Concerns:  None

## 2016-02-05 NOTE — Progress Notes (Signed)
Subjective:   [name] is a 83 y.o. male who presents for an Initial Medicare Annual Wellness Visit.  Review of Systems Cardiac Risk Factors include: advanced age (>26men, >70 women);dyslipidemia;family history of premature cardiovascular disease;hypertension;male gender;smoking/ tobacco exposure    Objective:    Today's Vitals   02/05/16 1433  BP: (!) 180/64  Pulse: 73  Temp: 98.2 F (36.8 C)  TempSrc: Oral  SpO2: 99%  Weight: 170 lb (77.1 kg)  Height: 6\' 1"  (1.854 m)   Body mass index is 22.43 kg/m.  Current Medications (verified) Outpatient Encounter Prescriptions as of 02/05/2016  Medication Sig  . aspirin 81 MG tablet Take 81 mg by mouth daily.  . calcium acetate, Phos Binder, (PHOSLYRA) 667 MG/5ML SOLN Take by mouth 3 (three) times daily with meals.  . cinacalcet (SENSIPAR) 30 MG tablet Take 30 mg by mouth 3 (three) times a week.  Marland Kitchen doxercalciferol (HECTOROL) 0.5 MCG capsule Take two tablets by mouth three times weekly at diayslis  . metoprolol tartrate (LOPRESSOR) 25 MG tablet Take 1/2 tablet on Sunday, Tuesday, Thursday and Saturday. Take 1/2 tablet by mouth at bedtime on Monday, Wednesday and Friday for blood pressure  . Multiple Vitamins-Minerals (CERTA-VITE SENIOR-LUTEIN PO) Take 1 tablet by mouth. Take 1 tablet (4-300-250)by mouth every day  . simvastatin (ZOCOR) 5 MG tablet Take 2 tablets (10 mg total) by mouth 1 day or 1 dose.  . [DISCONTINUED] dextromethorphan-guaiFENesin (MUCINEX DM) 30-600 MG per 12 hr tablet Take 1 tablet by mouth 2 (two) times daily.  . [DISCONTINUED] ipratropium-albuterol (DUONEB) 0.5-2.5 (3) MG/3ML SOLN Take 3 mLs by nebulization every 6 (six) hours as needed.   No facility-administered encounter medications on file as of 02/05/2016.     Allergies (verified) Patient has no known allergies.   History: Past Medical History:  Diagnosis Date  . Anemia   . BPH (benign prostatic hyperplasia)   . CHF (congestive heart failure) ([place])    . Chronic kidney disease   . Dementia in conditions classified elsewhere without behavioral disturbance   . Hyperlipidemia   . Hypertension   . Prostate cancer [facility])    Past Surgical History:  Procedure Laterality Date  . AV FISTULA PLACEMENT Left 08/11/2009   Dr Sherren Mocha Early  . CYSTOSCOPY  06/21/2003  . PROSTATE BIOPSY  04/01/2000  . PROSTATECTOMY    . SHUNTOGRAM N/A 02/08/2012   Procedure: Earney Mallet;  Surgeon: Serafina Mitchell, MD;  Location: [facility] CATH LAB;  Service: Cardiovascular;  Laterality: N/A;   Family History  Problem Relation Age of Onset  . Stroke Father   . Early death Sister     Some type of accident  . Cancer Sister     Throat  . Diabetes Sister   . Hypertension Sister   . Dementia Sister    Social History   Occupational History  . Not on file.   Social History Main Topics  . Smoking status: Current Every Day Smoker    Packs/day: 0.25    Years: 60.00    Types: Cigarettes    Last attempt to quit: 07/25/2011  . Smokeless tobacco: Never Used     Comment: 2 cigarette every now and then  . Alcohol use No  . Drug use: No  . Sexual activity: No   Tobacco Counseling Ready to quit: No Counseling given: No   Activities of Daily Living In your present state of health, do you have any difficulty performing the following activities: 02/05/2016 07/10/2015  Hearing? N N  Vision? N N  Difficulty concentrating or making decisions? N N  Walking or climbing stairs? Y Y  Dressing or bathing? Y N  Doing errands, shopping? Tempie Donning  Preparing Food and eating ? Y -  Using the Toilet? N -  In the past six months, have you accidently leaked urine? N -  Do you have problems with loss of bowel control? N -  Managing your Medications? Y -  Managing your Finances? Y -  Housekeeping or managing your Housekeeping? Y -  Some recent data might be hidden    Immunizations and Health Maintenance Immunization History  Administered Date(s) Administered  . Influenza Split 10/14/2012    . Pneumococcal Conjugate-13 10/12/2011, 10/19/2014  . Pneumococcal Polysaccharide-23 02/05/2016  . Td 08/07/2011   There are no preventive care reminders to display for this patient.  Patient Care Team: Christian Chandler, NP as PCP - General (Nurse Practitioner)  Indicate any recent Medical Services you may have received from other than [facility] providers in the past year (date may be approximate).    Assessment:   This is a routine wellness examination for [name].  Hearing/Vision screen Vision Screening Comments: Last eye exam was done at [facility] (where pt lives).   Dietary issues and exercise activities discussed: Current Exercise Habits: The patient does not participate in regular exercise at present, Exercise limited by: orthopedic condition(s)  Goals    . increase physical activity          Starting 02/05/16, I will attempt to increase my physical activity at [facility].       Depression Screen PHQ 2/9 Scores 02/05/2016 07/10/2015 09/24/2014 06/05/2013  PHQ - 2 Score 0 0 0 0    Fall Risk Fall Risk  02/05/2016 07/10/2015 09/24/2014 06/18/2014 06/05/2013  Falls in the past year? No No No No No  Risk for fall due to : Impaired balance/gait - - - -    Cognitive Function: MMSE - Mini Mental State Exam 02/05/2016 07/10/2015 04/18/2012  Orientation to time 5 5 5   Orientation to Place 4 5 5   Registration 3 3 3   Attention/ Calculation 3 4 5   Recall 2 1 2   Language- name 2 objects 2 2 2   Language- repeat 1 1 1   Language- follow 3 step command 3 3 3   Language- read & follow direction 1 1 1   Write a sentence 1 1 1   Copy design 0 0 1  Total score 25 26 29         Screening Tests Health Maintenance  Topic Date Due  . ZOSTAVAX  08/11/2018 (Originally 09/29/1987)  . TETANUS/TDAP  08/06/2021  . INFLUENZA VACCINE  Addressed  . PNA vac Low Risk Adult  Completed        Plan:    I have personally reviewed and addressed the Medicare Annual Wellness questionnaire and  have noted the following in the patient's chart:  A. Medical and social history B. Use of alcohol, tobacco or illicit drugs  C. Current medications and supplements D. Functional ability and status E.  Nutritional status F.  Physical activity G. Advance directives H. List of other physicians I.  Hospitalizations, surgeries, and ER visits in previous 12 months J.  [place] to include hearing, vision, cognitive, depression L. Referrals and appointments - none  In addition, I have reviewed and discussed with patient certain preventive protocols, quality metrics, and best practice recommendations. A written personalized care plan for preventive services as well as general preventive  health recommendations were provided to patient.  See attached scanned questionnaire for additional information.   Signed,   Allyn Kenner, LPN Health Advisor   I reviewed health advisors note, was available for consultation and agree with documentation and plan.  Carlos American. Harle Battiest  [facility] [phone] 8 am - 5 pm) [phone] (after hours)

## 2016-02-06 LAB — LIPID PANEL
CHOLESTEROL: 104 mg/dL (ref <200)
HDL: 34 mg/dL — ABNORMAL LOW (ref >40)
LDL Cholesterol: 49 mg/dL (ref <100)
TRIGLYCERIDES: 106 mg/dL (ref <150)
Total CHOL/HDL Ratio: 3.1 Ratio (ref <5.0)
VLDL: 21 mg/dL (ref <30)

## 2016-02-06 LAB — HEPATIC FUNCTION PANEL
ALK PHOS: 91 U/L (ref 40–115)
ALT: 6 U/L — AB (ref 9–46)
AST: 16 U/L (ref 10–35)
Albumin: 3.4 g/dL — ABNORMAL LOW (ref 3.6–5.1)
BILIRUBIN DIRECT: 0.1 mg/dL (ref <=0.2)
BILIRUBIN INDIRECT: 0.4 mg/dL (ref 0.2–1.2)
TOTAL PROTEIN: 7 g/dL (ref 6.1–8.1)
Total Bilirubin: 0.5 mg/dL (ref 0.2–1.2)

## 2016-02-13 ENCOUNTER — Telehealth: Payer: Self-pay

## 2016-02-13 NOTE — Telephone Encounter (Signed)
Patient's daughter called to discuss recent labs. [name] verbalized understanding of results and aware copy of labs mailed.

## 2016-05-06 ENCOUNTER — Ambulatory Visit (INDEPENDENT_AMBULATORY_CARE_PROVIDER_SITE_OTHER): Payer: Medicare Other | Admitting: Podiatry

## 2016-05-06 ENCOUNTER — Encounter: Payer: Self-pay | Admitting: Podiatry

## 2016-05-06 DIAGNOSIS — B351 Tinea unguium: Secondary | ICD-10-CM | POA: Diagnosis not present

## 2016-05-06 NOTE — Progress Notes (Signed)
Patient ID: [name], male   DOB: Mar 15, 1925, 83 y.o.   MRN: [id number] Complaint:  Visit Type: Patient returns to my office for continued preventative foot care services. Complaint: Patient states" my nails have grown long and thick and become painful to walk and wear shoes.. The patient presents for preventative foot care services. No changes to ROS  Podiatric Exam: Vascular: dorsalis pedis and posterior tibial pulses are not  palpable bilateral. Capillary return is immediate. Cold feet noted.  Sensorium: Normal Semmes Weinstein monofilament test. Normal tactile sensation bilaterally. Nail Exam: Pt has thick disfigured discolored nails with subungual debris noted bilateral entire nail hallux through fifth toenails Ulcer Exam: There is no evidence of ulcer or pre-ulcerative changes or infection. Orthopedic Exam: Muscle tone and strength are WNL. No limitations in general ROM. No crepitus or effusions noted. Foot type and digits show no abnormalities. Bony prominences are unremarkable. Skin: No Porokeratosis. No infection or ulcers  Diagnosis:  Onychomycosis, , Pain in right toe, pain in left toes  Treatment & Plan Procedures and Treatment: Consent by patient was obtained for treatment procedures. The patient understood the discussion of treatment and procedures well. All questions were answered thoroughly reviewed. Debridement of mycotic and hypertrophic toenails, 1 through 5 bilateral and clearing of subungual debris. No ulceration, no infection noted.  Return Visit-Office Procedure: Patient instructed to return to the office for a follow up visit 3 months for continued evaluation and treatment.   Gardiner Barefoot DPM

## 2016-06-10 ENCOUNTER — Telehealth: Payer: Self-pay

## 2016-06-10 NOTE — Telephone Encounter (Signed)
Lets set up an appt time with pt and we can call the daughter while he is here.  He does have memory deficit which lack of hygiene can be an issue in regards to progressive memory loss and this may contribute to the problem however no one can make him bath. Agree that the family may have to be more involved and find out why he does not wish to take a bath or shower.

## 2016-06-10 NOTE — Telephone Encounter (Signed)
Hilda Blades (patient's daughter) called to get recommendations regarding patient's hygiene. Patient is living at [facility] and [facility] states patient can refuse bathing. The Dialysis center contacted the family and informed them that they can not continue to allow the patient to come to the dialysis center with soiled clothing and improper hygiene.   The family is at a stand still not knowing what can be done in this situation and needs the expertise of patient's PCP. Please advise

## 2016-06-10 NOTE — Telephone Encounter (Signed)
I would educate the patient on the situation, that dialysis is requiring him to bath and change clothing prior to his appt. He needs dialysis to prolong life. Have a caregiver that he trust help him with bathing (he may prefer a male) and give him choices as to time of bath and clothing change (morning vs evening)

## 2016-06-10 NOTE — Telephone Encounter (Signed)
Spoke with patients daughter, who lives in [place]. Patient's children and spouses work full time and no one is available to perform this task of bathing and proper hygiene. Patient's daughter has requested a male caregiver and when one is available one is sent to the facility and they still have this issue.   Patient's daughter was dissatisfied with this response and would like to speak directly with Janett Billow for a resolution, I tried to explain to the patient's daughter that the family will have to be involved for it appears he is not receptive to strangers.

## 2016-06-10 NOTE — Telephone Encounter (Signed)
Spoke with [name] will correspond with Velma (sister) and have Velma call to schedule an appointment that will fit both schedules.

## 2016-07-12 ENCOUNTER — Other Ambulatory Visit: Payer: Self-pay

## 2016-07-12 NOTE — Telephone Encounter (Signed)
This should be prescribed through dialysis

## 2016-07-12 NOTE — Telephone Encounter (Signed)
Incoming fax requesting refill for Sensipar 30 mg. Please advise for previous dispense number is #[phone], patient is taking medication 3 x weekly (12 pills monthly). 3 month supply would be 36.  Please advise on dispense and refills allowed

## 2016-08-05 ENCOUNTER — Ambulatory Visit: Payer: Medicare Other | Admitting: Internal Medicine

## 2016-08-17 ENCOUNTER — Ambulatory Visit (INDEPENDENT_AMBULATORY_CARE_PROVIDER_SITE_OTHER): Payer: Medicare Other | Admitting: Podiatry

## 2016-08-17 ENCOUNTER — Encounter: Payer: Self-pay | Admitting: Podiatry

## 2016-08-17 DIAGNOSIS — B351 Tinea unguium: Secondary | ICD-10-CM

## 2016-08-17 NOTE — Progress Notes (Signed)
Patient ID: [name], male   DOB: 1925-02-27, 83 y.o.   MRN: [id number] Complaint:  Visit Type: Patient returns to my office for continued preventative foot care services. Complaint: Patient states" my nails have grown long and thick and become painful to walk and wear shoes.. The patient presents for preventative foot care services. No changes to ROS  Podiatric Exam: Vascular: dorsalis pedis and posterior tibial pulses are not  palpable bilateral. Capillary return is immediate. Cold feet noted.  Sensorium: Normal Semmes Weinstein monofilament test. Normal tactile sensation bilaterally. Nail Exam: Pt has thick disfigured discolored nails with subungual debris noted bilateral entire nail hallux through fifth toenails Ulcer Exam: There is no evidence of ulcer or pre-ulcerative changes or infection. Orthopedic Exam: Muscle tone and strength are WNL. No limitations in general ROM. No crepitus or effusions noted. Foot type and digits show no abnormalities. Bony prominences are unremarkable. Skin: No Porokeratosis. No infection or ulcers  Diagnosis:  Onychomycosis, , Pain in right toe, pain in left toes  Treatment & Plan Procedures and Treatment: Consent by patient was obtained for treatment procedures. The patient understood the discussion of treatment and procedures well. All questions were answered thoroughly reviewed. Debridement of mycotic and hypertrophic toenails, 1 through 5 bilateral and clearing of subungual debris. No ulceration, no infection noted.  Return Visit-Office Procedure: Patient instructed to return to the office for a follow up visit 3 months for continued evaluation and treatment.   Gardiner Barefoot DPM

## 2016-08-30 ENCOUNTER — Encounter (HOSPITAL_COMMUNITY): Payer: Self-pay | Admitting: Emergency Medicine

## 2016-08-30 ENCOUNTER — Emergency Department (HOSPITAL_COMMUNITY)
Admission: EM | Admit: 2016-08-30 | Discharge: 2016-08-30 | Disposition: A | Discharge status: Home / Self Care | Payer: Medicare Other | Attending: Emergency Medicine | Admitting: Emergency Medicine

## 2016-08-30 DIAGNOSIS — F17210 Nicotine dependence, cigarettes, uncomplicated: Secondary | ICD-10-CM | POA: Insufficient documentation

## 2016-08-30 DIAGNOSIS — Z79899 Other long term (current) drug therapy: Secondary | ICD-10-CM | POA: Diagnosis not present

## 2016-08-30 DIAGNOSIS — I132 Hypertensive heart and chronic kidney disease with heart failure and with stage 5 chronic kidney disease, or end stage renal disease: Secondary | ICD-10-CM | POA: Diagnosis not present

## 2016-08-30 DIAGNOSIS — N186 End stage renal disease: Secondary | ICD-10-CM | POA: Diagnosis not present

## 2016-08-30 DIAGNOSIS — Z789 Other specified health status: Secondary | ICD-10-CM | POA: Diagnosis present

## 2016-08-30 DIAGNOSIS — I5022 Chronic systolic (congestive) heart failure: Secondary | ICD-10-CM | POA: Insufficient documentation

## 2016-08-30 DIAGNOSIS — Z7982 Long term (current) use of aspirin: Secondary | ICD-10-CM | POA: Insufficient documentation

## 2016-08-30 DIAGNOSIS — Z992 Dependence on renal dialysis: Secondary | ICD-10-CM | POA: Insufficient documentation

## 2016-08-30 DIAGNOSIS — Z8546 Personal history of malignant neoplasm of prostate: Secondary | ICD-10-CM | POA: Diagnosis not present

## 2016-08-30 NOTE — ED Triage Notes (Signed)
Per [facility]:  Called to dialysis center due to bleeding fistula. During de access nurses were unable to control bleeding.  Pt completed full dialysis treatment without complications. No other complaints.  V/S pta: 130/66, HR 72, O2 100% on RA

## 2016-08-30 NOTE — ED Provider Notes (Signed)
[facility] DEPT Provider Note   CSN: [id number] Arrival date & time: 08/30/16  1708     History   Chief Complaint Chief Complaint  Patient presents with  . Vascular Access Problem    HPI [name] is a 83 y.o. male.  HPI  The pt is an 83 y/o male - dialysis for ESRD on M,W,F, was at dialysis - finished his normal session and upon removal of the needles, the pt continued to have bleeding which would not stop despite pressure and gauze - a clamp was placed as per protocol and the pt was transferred to the hospital for evaluation.  He has not had any bleeding while en route to the hospital.  He has no physical complaints - has been eating well, drinking well and having no SOB, CP or fevers.  Past Medical History:  Diagnosis Date  . Anemia   . BPH (benign prostatic hyperplasia)   . CHF (congestive heart failure) ([place])   . Chronic kidney disease   . Dementia in conditions classified elsewhere without behavioral disturbance   . Hyperlipidemia   . Hypertension   . Prostate cancer [facility])     Patient Active Problem List   Diagnosis Date Noted  . ESRD on dialysis ([place]) 06/05/2013  . End stage renal disease ([place]) 01/25/2012  . 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 Chronic systolic congestive heart failure ([place]) 04/15/2011  . Renal failure (ARF), acute on chronic (HCC) 04/12/2011  . Metabolic acidosis 00/86/7619  . HTN (hypertension) 04/12/2011  . Hyperlipidemia 04/12/2011  . Tobacco abuse 04/12/2011    Past Surgical History:  Procedure Laterality Date  . AV FISTULA PLACEMENT Left 08/11/2009   Dr Sherren Mocha Early  . CYSTOSCOPY  06/21/2003  . PROSTATE BIOPSY  04/01/2000  . PROSTATECTOMY    . SHUNTOGRAM N/A 02/08/2012   Procedure: Earney Mallet;  Surgeon: Serafina Mitchell, MD;  Location: [facility] CATH LAB;  Service: Cardiovascular;  Laterality: N/A;       Home Medications    Prior to Admission medications   Medication Sig Start Date End Date Taking? Authorizing Provider  aspirin 81  MG tablet Take 81 mg by mouth daily.    [provider]  calcium acetate, Phos Binder, (PHOSLYRA) 667 MG/5ML SOLN Take by mouth 3 (three) times daily with meals.    [provider]  cinacalcet (SENSIPAR) 30 MG tablet Take 30 mg by mouth 3 (three) times a week.    [provider]  doxercalciferol (HECTOROL) 0.5 MCG capsule Take two tablets by mouth three times weekly at diayslis    [provider]  metoprolol tartrate (LOPRESSOR) 25 MG tablet Take 1/2 tablet on Sunday, Tuesday, Thursday and Saturday. Take 1/2 tablet by mouth at bedtime on Monday, Wednesday and Friday for blood pressure    [provider]  Multiple Vitamins-Minerals (CERTA-VITE SENIOR-LUTEIN PO) Take 1 tablet by mouth. Take 1 tablet (4-300-250)by mouth every day    [provider]  RENVELA 800 MG tablet Take 3 tablets by mouth 3 (three) times daily with meals. 01/09/16   [provider]  simvastatin (ZOCOR) 10 MG tablet Take 1 tablet by mouth at bedtime. 01/02/16   [provider]    Family History Family History  Problem Relation Age of Onset  . Stroke Father   . Early death Sister        Some type of accident  . Cancer Sister        Throat  . Diabetes Sister   . Hypertension  Sister   . Dementia Sister     Social History Social History  Substance Use Topics  . Smoking status: Current Every Day Smoker    Packs/day: 0.25    Years: 60.00    Types: Cigarettes    Last attempt to quit: 07/25/2011  . Smokeless tobacco: Never Used     Comment: 2 cigarette every now and then  . Alcohol use No     Allergies   Patient has no known allergies.   Review of Systems Review of Systems  All other systems reviewed and are negative.    Physical Exam Updated Vital Signs BP (!) 146/61   Pulse 78   Temp 97.8 F (36.6 C) (Oral)   Resp 14   Ht 6\' 3"  (1.905 m)   Wt 66 kg (145 lb 8.1 oz)   SpO2 98%   BMI 18.19 kg/m   Physical Exam  Constitutional:  He appears well-developed and well-nourished.  HENT:  Head: Normocephalic and atraumatic.  Eyes: Conjunctivae are normal. Right eye exhibits no discharge. Left eye exhibits no discharge.  Cardiovascular:  RRR - has no murmurs. Good thrill and pulse in the L arm at the fistula. No bleeding at this time - clamp came off.  Pulmonary/Chest: Effort normal. No respiratory distress.  Neurological: He is alert. Coordination normal.  Skin: Skin is warm and dry. No rash noted. He is not diaphoretic. No erythema.  Psychiatric: He has a normal mood and affect.  Nursing note and vitals reviewed.    ED Treatments / Results  Labs (all labs ordered are listed, but only abnormal results are displayed) Labs Reviewed - No data to display   Radiology No results found.  Procedures Procedures (including critical care time)  Medications Ordered in ED Medications - No data to display   Initial Impression / Assessment and Plan / ED Course  I have reviewed the triage vital signs and the nursing notes.  Pertinent labs & imaging results that were available during my care of the patient were reviewed by me and considered in my medical decision making (see chart for details).     No active bleeding Will observe for recurrence.  No bleeding after multiple evlauations Stable for d/c Family h ere and in agreement as well  Final Clinical Impressions(s) / ED Diagnoses   Final diagnoses:  Problem with vascular access    New Prescriptions New Prescriptions   No medications on file     Noemi Chapel, MD 08/30/16 1844

## 2016-08-30 NOTE — Discharge Instructions (Signed)
If you start bleeding again, come back to the ER immediately

## 2016-11-18 ENCOUNTER — Ambulatory Visit: Payer: Medicare Other | Admitting: Internal Medicine

## 2016-11-18 ENCOUNTER — Encounter: Payer: Self-pay | Admitting: Internal Medicine

## 2016-11-18 VITALS — BP 132/60 | HR 73 | Temp 98.3°F | Wt 152.0 lb

## 2016-11-18 DIAGNOSIS — Z720 Tobacco use: Secondary | ICD-10-CM | POA: Diagnosis not present

## 2016-11-18 DIAGNOSIS — N186 End stage renal disease: Secondary | ICD-10-CM | POA: Diagnosis not present

## 2016-11-18 DIAGNOSIS — I10 Essential (primary) hypertension: Secondary | ICD-10-CM | POA: Diagnosis not present

## 2016-11-18 DIAGNOSIS — E785 Hyperlipidemia, unspecified: Secondary | ICD-10-CM

## 2016-11-18 DIAGNOSIS — F0150 Vascular dementia without behavioral disturbance: Secondary | ICD-10-CM

## 2016-11-18 DIAGNOSIS — I5022 Chronic systolic (congestive) heart failure: Secondary | ICD-10-CM | POA: Diagnosis not present

## 2016-11-18 DIAGNOSIS — J449 Chronic obstructive pulmonary disease, unspecified: Secondary | ICD-10-CM | POA: Insufficient documentation

## 2016-11-18 DIAGNOSIS — Z992 Dependence on renal dialysis: Secondary | ICD-10-CM

## 2016-11-18 NOTE — Progress Notes (Signed)
Location:  [facility] clinic Provider:  Ariell Gunnels L. Mariea Clonts, D.O., C.M.D.  Code Status: DNR Goals of Care:  Advanced Directives 11/18/2016  Does Patient Have a Medical Advance Directive? Yes  Type of Paramedic of [place];Out of facility DNR (pink MOST or yellow form)  Does patient want to make changes to medical advance directive? No - Patient declined  Copy of [place] in Chart? Yes  Pre-existing out of facility DNR order (yellow form or pink MOST form) Yellow form placed in chart (order not valid for inpatient use)   Chief Complaint  Patient presents with  . Medical Management of Chronic Issues    66mth follow-up    HPI: Patient is a 83 y.o. male seen today for medical management of chronic diseases.  Pt lives at [place].  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    HTN:  bp at goal today.    ESRD on HD:  He was in the ED 8/20 with a vascular access problem.  He gets HD M/W/F.  His access was bleeding for a prolonged time so he was sent to ED.  Has left arm fistula.  Has history of uremic encephalopathy in 2013.  Aranesp was being given at one point per "chl anemia therapy"--unclear if active.  He's been on HD at least 5 years.  He was a bit too old to get a transplant.  No chest pain or sob.  Fluid removal has been steady.  Dry weight has been satisfactory.  [place] [address].    Chronic systolic chf:  No echo in epic.  His daughter does not recall this one.  Pt does report seeing cardiology before but not sure whom.  Hyperlipidemia:  Last LDL as 49 in Jan.  Liver panel with low albumin but otherwise normal at that time.  He's on low dose zocor.    Memory loss:  MMSE 25/30 and failed clock in January.  Notes in May indicate hygiene issues and staff not able to get him to bathe.  Having more difficulty with bathing and dressing.  His daughter says he pushes them away.  Does not like multiple disturbances.  Says  it would help to have the same CNA with him more often.    Had a dental visit on Tuesday.  Had not been removing his dentures at night.    Onychyomycosis:  Sees podiatry for nail trimming.    Tobacco abuse:  Still smokes.  He has a Conservator, museum/gallery at [facility] where they smoke.  4 cigarettes per day per daughter.  Doesn't like menthol.    He is doing well by his report.  No concerns.  His daughter reminds him of appts.  Had radiation for prostate cancer, also had surgery in 2002.    Had a fall where he slid out of his wheelchair, but no injury.    Had flu shot at HD center--asked CMA to request records from this and his latest notes and labs.   Past Medical History:  Diagnosis Date  . Anemia   . BPH (benign prostatic hyperplasia)   . CHF (congestive heart failure) ([place])   . Chronic kidney disease   . Dementia in conditions classified elsewhere without behavioral disturbance   . Hyperlipidemia   . Hypertension   . Prostate cancer [facility])     Past Surgical History:  Procedure Laterality Date  . AV FISTULA PLACEMENT Left 08/11/2009  Dr Curt Jews  . CYSTOSCOPY  06/21/2003  . PROSTATE BIOPSY  04/01/2000  . PROSTATECTOMY      No Known Allergies  Outpatient Encounter Medications as of 11/18/2016  Medication Sig  . aspirin 81 MG tablet Take 81 mg by mouth daily.  . calcium acetate, Phos Binder, (PHOSLYRA) 667 MG/5ML SOLN Take by mouth 3 (three) times daily with meals.  . cinacalcet (SENSIPAR) 30 MG tablet Take 30 mg by mouth 3 (three) times a week.  Marland Kitchen doxercalciferol (HECTOROL) 0.5 MCG capsule Take two tablets by mouth three times weekly at diayslis  . metoprolol tartrate (LOPRESSOR) 25 MG tablet Take 1/2 tablet on Sunday, Tuesday, Thursday and Saturday. Take 1/2 tablet by mouth at bedtime on Monday, Wednesday and Friday for blood pressure  . Multiple Vitamins-Minerals (CERTA-VITE SENIOR-LUTEIN PO) Take 1 tablet by mouth. Take 1 tablet (4-300-250)by mouth every day  . RENVELA 800 MG  tablet Take 3 tablets by mouth 3 (three) times daily with meals.  . simvastatin (ZOCOR) 10 MG tablet Take 1 tablet by mouth at bedtime.   No facility-administered encounter medications on file as of 11/18/2016.     Review of Systems:  Review of Systems  Constitutional: Positive for weight loss. Negative for chills, fever and malaise/fatigue.  HENT: Negative for congestion and hearing loss.   Eyes: Negative for blurred vision.       Left eye alignment different than right  Respiratory: Negative for cough and shortness of breath.   Cardiovascular: Negative for chest pain, palpitations and leg swelling.  Gastrointestinal: Negative for abdominal pain, blood in stool, constipation and melena.  Genitourinary: Negative for dysuria.  Musculoskeletal: Positive for falls.       Slid out of wheelchair to floor, no injury  Skin: Negative for itching and rash.  Neurological: Negative for dizziness, loss of consciousness and weakness.  Psychiatric/Behavioral: Positive for memory loss.    Health Maintenance  Topic Date Due  . INFLUENZA VACCINE  08/11/2016  . TETANUS/TDAP  08/06/2021  . PNA vac Low Risk Adult  Completed    Physical Exam: Vitals:   11/18/16 0924  BP: 132/60  Pulse: 73  Temp: 98.3 F (36.8 C)  TempSrc: Oral  SpO2: 98%  Weight: 152 lb (68.9 kg)   Body mass index is 19 kg/m. Physical Exam  Constitutional: He is oriented to person, place, and time.  Frail chronically ill appearing AA male  HENT:  Head: Normocephalic and atraumatic.  Eyes: Conjunctivae are normal. Pupils are equal, round, and reactive to light.  Left eye deviates medially  Cardiovascular: Normal rate, regular rhythm and normal heart sounds.  Pulmonary/Chest: Effort normal and breath sounds normal. No respiratory distress.  Abdominal: Soft. Bowel sounds are normal. He exhibits no distension. There is no tenderness.  Musculoskeletal: Normal range of motion.  Uses manual wheelchair for mobility    Neurological: He is alert and oriented to person, place, and time.  Skin: Skin is warm and dry.  Dry scaly skin of extremities  Psychiatric: He has a normal mood and affect.    Labs reviewed: Basic Metabolic Panel: No results for input(s): NA, K, CL, CO2, GLUCOSE, BUN, CREATININE, CALCIUM, MG, PHOS, TSH in the last 8760 hours. Liver Function Tests: Recent Labs    02/05/16 1602  AST 16  ALT 6*  ALKPHOS 91  BILITOT 0.5  PROT 7.0  ALBUMIN 3.4*   No results for input(s): LIPASE, AMYLASE in the last 8760 hours. No results for input(s): AMMONIA in the last 8760  hours. CBC: No results for input(s): WBC, NEUTROABS, HGB, HCT, MCV, PLT in the last 8760 hours. Lipid Panel: Recent Labs    02/05/16 1602  CHOL 104  HDL 34*  LDLCALC 49  TRIG 106  CHOLHDL 3.1   Lab Results  Component Value Date   HGBA1C  11/26/2006    5.3 (NOTE)   The ADA recommends the following therapeutic goals for glycemic   control related to Hgb A1C measurement:   Goal of Therapy:   < 7.0% Hgb A1C   Action Suggested:  > 8.0% Hgb A1C   Ref:  Diabetes Care, 22, Suppl. 1, 1999    Assessment/Plan 1. Chronic obstructive pulmonary disease, unspecified COPD type ([place]) -stable, not on any inhalers, denies cough or sob  2. 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 Chronic systolic congestive heart failure (HCC) -continues on baby asa, lopressor -no signs of volume overload, appears on dry side this am  4. Essential hypertension -bp well controlled on lopressor, denies difficulty with hypotension or chest pain at HD  5. Hyperlipidemia, unspecified hyperlipidemia type -cont statin therapy with zocor due to increased CAD risk with ESRD, but considering overall prognosis, this may no longer be of much benefit for him  6. Tobacco abuse -ongoing!  Smokes less than he once did,  but has no desire to quit whatsoever despite counseling today   7.  Vascular dementia -discussed with pt and daughter that they must discuss during care plan meeting his desire to have fewer visits by staff for various interventions and he prefers to have a the same CNA for his adl care as much as possible--he has been refusing care with ADLs in the mornings  Labs/tests ordered:  No orders of the defined types were placed in this encounter. requested lab copy from HD  Next appt: 6 mos with Janett Billow (PCP)  Nakita Santerre L. Calianne Larue, D.O. [facility] [address], [place] [address] Cell Phone (Mon-Fri 8am-5pm):  [phone] On Call:  [phone] & follow prompts after 5pm & weekends Office Phone:  [phone] Office Fax:  [phone]

## 2016-11-22 DIAGNOSIS — F0150 Vascular dementia without behavioral disturbance: Secondary | ICD-10-CM | POA: Insufficient documentation

## 2016-11-23 ENCOUNTER — Encounter: Payer: Self-pay | Admitting: Podiatry

## 2016-11-23 ENCOUNTER — Ambulatory Visit: Payer: Medicare Other | Admitting: Podiatry

## 2016-11-23 DIAGNOSIS — B351 Tinea unguium: Secondary | ICD-10-CM | POA: Diagnosis not present

## 2016-11-23 NOTE — Progress Notes (Signed)
Patient ID: [name], male   DOB: 1925/02/28, 83 y.o.   MRN: [id number] Complaint:  Visit Type: Patient returns to my office for continued preventative foot care services. Complaint: Patient states" my nails have grown long and thick and become painful to walk and wear shoes.. The patient presents for preventative foot care services. No changes to ROS  Podiatric Exam: Vascular: dorsalis pedis and posterior tibial pulses are not  palpable bilateral. Capillary return is immediate. Cold feet noted.  Sensorium: Normal Semmes Weinstein monofilament test. Normal tactile sensation bilaterally. Nail Exam: Pt has thick disfigured discolored nails with subungual debris noted bilateral entire nail hallux through fifth toenails Ulcer Exam: There is no evidence of ulcer or pre-ulcerative changes or infection. Orthopedic Exam: Muscle tone and strength are WNL. No limitations in general ROM. No crepitus or effusions noted. Foot type and digits show no abnormalities. Bony prominences are unremarkable. Skin: No Porokeratosis. No infection or ulcers.  Alligator like skin dorsally.  Diagnosis:  Onychomycosis, , Pain in right toe, pain in left toes  Treatment & Plan Procedures and Treatment: Consent by patient was obtained for treatment procedures. The patient understood the discussion of treatment and procedures well. All questions were answered thoroughly reviewed. Debridement of mycotic and hypertrophic toenails, 1 through 5 bilateral and clearing of subungual debris. No ulceration, no infection noted.  Return Visit-Office Procedure: Patient instructed to return to the office for a follow up visit 3 months for continued evaluation and treatment.   Gardiner Barefoot DPM

## 2017-03-01 ENCOUNTER — Ambulatory Visit: Payer: Medicare Other | Admitting: Podiatry

## 2017-03-03 ENCOUNTER — Ambulatory Visit: Payer: Medicare Other

## 2017-03-04 ENCOUNTER — Telehealth: Payer: Self-pay

## 2017-03-04 NOTE — Telephone Encounter (Signed)
Called to inform pt that he missed his AWV appointment yesterday. There was no answer and his mailbox was full and was unable to leave message

## 2017-03-22 ENCOUNTER — Ambulatory Visit: Payer: Medicare Other | Admitting: Podiatry

## 2017-03-31 ENCOUNTER — Ambulatory Visit (INDEPENDENT_AMBULATORY_CARE_PROVIDER_SITE_OTHER): Payer: Medicare Other

## 2017-03-31 VITALS — BP 170/78 | HR 73 | Temp 98.0°F | Ht 73.0 in | Wt 153.0 lb

## 2017-03-31 DIAGNOSIS — Z0000 Encounter for general adult medical examination without abnormal findings: Secondary | ICD-10-CM | POA: Diagnosis not present

## 2017-03-31 NOTE — Patient Instructions (Signed)
[name] , Thank you for taking time to come for your Medicare Wellness Visit. I appreciate your ongoing commitment to your health goals. Please review the following plan we discussed and let me know if I can assist you in the future.   Screening recommendations/referrals: Colonoscopy excluded, you are over age 82 Recommended yearly ophthalmology/optometry visit for glaucoma screening and checkup Recommended yearly dental visit for hygiene and checkup  Vaccinations: Influenza vaccine up to date, due 2019 fall season Pneumococcal vaccine up to date, completed Tdap vaccine up to date, due 08/06/2021 Shingles vaccine (Shingrix) due, please check if facility can give it. If not we will send a prescription to your pharmacy.    Advanced directives: in chart  Conditions/risks identified: none  Next appointment: [name], NP 05/19/2017 @ 2:15pm             Tyson Dense, RN 04/04/2018 @ 2:30pm  Preventive Care 61 Years and Older, Male Preventive care refers to lifestyle choices and visits with your health care provider that can promote health and wellness. What does preventive care include?  A yearly physical exam. This is also called an annual well check.  Dental exams once or twice a year.  Routine eye exams. Ask your health care provider how often you should have your eyes checked.  Personal lifestyle choices, including:  Daily care of your teeth and gums.  Regular physical activity.  Eating a healthy diet.  Avoiding tobacco and drug use.  Limiting alcohol use.  Practicing safe sex.  Taking low doses of aspirin every day.  Taking vitamin and mineral supplements as recommended by your health care provider. What happens during an annual well check? The services and screenings done by your health care provider during your annual well check will depend on your age, overall health, lifestyle risk factors, and family history of disease. Counseling  Your health care provider  may ask you questions about your:  Alcohol use.  Tobacco use.  Drug use.  Emotional well-being.  Home and relationship well-being.  Sexual activity.  Eating habits.  History of falls.  Memory and ability to understand (cognition).  Work and work Statistician. Screening  You may have the following tests or measurements:  Height, weight, and BMI.  Blood pressure.  Lipid and cholesterol levels. These may be checked every 5 years, or more frequently if you are over 51 years old.  Skin check.  Lung cancer screening. You may have this screening every year starting at age 57 if you have a 30-pack-year history of smoking and currently smoke or have quit within the past 15 years.  Fecal occult blood test (FOBT) of the stool. You may have this test every year starting at age 42.  Flexible sigmoidoscopy or colonoscopy. You may have a sigmoidoscopy every 5 years or a colonoscopy every 10 years starting at age 74.  Prostate cancer screening. Recommendations will vary depending on your family history and other risks.  Hepatitis C blood test.  Hepatitis B blood test.  Sexually transmitted disease (STD) testing.  Diabetes screening. This is done by checking your blood sugar (glucose) after you have not eaten for a while (fasting). You may have this done every 1-3 years.  Abdominal aortic aneurysm (AAA) screening. You may need this if you are a current or former smoker.  Osteoporosis. You may be screened starting at age 60 if you are at high risk. Talk with your health care provider about your test results, treatment options, and if necessary, the  need for more tests. Vaccines  Your health care provider may recommend certain vaccines, such as:  Influenza vaccine. This is recommended every year.  Tetanus, diphtheria, and acellular pertussis (Tdap, Td) vaccine. You may need a Td booster every 10 years.  Zoster vaccine. You may need this after age 25.  Pneumococcal 13-valent  conjugate (PCV13) vaccine. One dose is recommended after age 63.  Pneumococcal polysaccharide (PPSV23) vaccine. One dose is recommended after age 67. Talk to your health care provider about which screenings and vaccines you need and how often you need them. This information is not intended to replace advice given to you by your health care provider. Make sure you discuss any questions you have with your health care provider. Document Released: 01/24/2015 Document Revised: 09/17/2015 Document Reviewed: 10/29/2014 Elsevier Interactive Patient Education  2017 [place] Prevention in the Home Falls can cause injuries. They can happen to people of all ages. There are many things you can do to make your home safe and to help prevent falls. What can I do on the outside of my home?  Regularly fix the edges of walkways and driveways and fix any cracks.  Remove anything that might make you trip as you walk through a door, such as a raised step or threshold.  Trim any bushes or trees on the path to your home.  Use bright outdoor lighting.  Clear any walking paths of anything that might make someone trip, such as rocks or tools.  Regularly check to see if handrails are loose or broken. Make sure that both sides of any steps have handrails.  Any raised decks and porches should have guardrails on the edges.  Have any leaves, snow, or ice cleared regularly.  Use sand or salt on walking paths during winter.  Clean up any spills in your garage right away. This includes oil or grease spills. What can I do in the bathroom?  Use night lights.  Install grab bars by the toilet and in the tub and shower. Do not use towel bars as grab bars.  Use non-skid mats or decals in the tub or shower.  If you need to sit down in the shower, use a plastic, non-slip stool.  Keep the floor dry. Clean up any water that spills on the floor as soon as it happens.  Remove soap buildup in the tub or  shower regularly.  Attach bath mats securely with double-sided non-slip rug tape.  Do not have throw rugs and other things on the floor that can make you trip. What can I do in the bedroom?  Use night lights.  Make sure that you have a light by your bed that is easy to reach.  Do not use any sheets or blankets that are too big for your bed. They should not hang down onto the floor.  Have a firm chair that has side arms. You can use this for support while you get dressed.  Do not have throw rugs and other things on the floor that can make you trip. What can I do in the kitchen?  Clean up any spills right away.  Avoid walking on wet floors.  Keep items that you use a lot in easy-to-reach places.  If you need to reach something above you, use a strong step stool that has a grab bar.  Keep electrical cords out of the way.  Do not use floor polish or wax that makes floors slippery. If you must use wax,  use non-skid floor wax.  Do not have throw rugs and other things on the floor that can make you trip. What can I do with my stairs?  Do not leave any items on the stairs.  Make sure that there are handrails on both sides of the stairs and use them. Fix handrails that are broken or loose. Make sure that handrails are as long as the stairways.  Check any carpeting to make sure that it is firmly attached to the stairs. Fix any carpet that is loose or worn.  Avoid having throw rugs at the top or bottom of the stairs. If you do have throw rugs, attach them to the floor with carpet tape.  Make sure that you have a light switch at the top of the stairs and the bottom of the stairs. If you do not have them, ask someone to add them for you. What else can I do to help prevent falls?  Wear shoes that:  Do not have high heels.  Have rubber bottoms.  Are comfortable and fit you well.  Are closed at the toe. Do not wear sandals.  If you use a stepladder:  Make sure that it is fully  opened. Do not climb a closed stepladder.  Make sure that both sides of the stepladder are locked into place.  Ask someone to hold it for you, if possible.  Clearly mark and make sure that you can see:  Any grab bars or handrails.  First and last steps.  Where the edge of each step is.  Use tools that help you move around (mobility aids) if they are needed. These include:  Canes.  Walkers.  Scooters.  Crutches.  Turn on the lights when you go into a dark area. Replace any light bulbs as soon as they burn out.  Set up your furniture so you have a clear path. Avoid moving your furniture around.  If any of your floors are uneven, fix them.  If there are any pets around you, be aware of where they are.  Review your medicines with your doctor. Some medicines can make you feel dizzy. This can increase your chance of falling. Ask your doctor what other things that you can do to help prevent falls. This information is not intended to replace advice given to you by your health care provider. Make sure you discuss any questions you have with your health care provider. Document Released: 10/24/2008 Document Revised: 06/05/2015 Document Reviewed: 02/01/2014 Elsevier Interactive Patient Education  2017 [employer].

## 2017-03-31 NOTE — Progress Notes (Signed)
Subjective:   [name] is a 84 y.o. male who presents for Medicare Annual/Subsequent preventive examination.  Last AWV-02/05/2016    Objective:    Vitals: BP (!) 170/78 (BP Location: Right Arm, Patient Position: Sitting)   Pulse 73   Temp 98 F (36.7 C) (Oral)   Ht 6\' 1"  (1.854 m)   Wt 153 lb (69.4 kg)   SpO2 96%   BMI 20.19 kg/m   Body mass index is 20.19 kg/m.  Provider notified of BP  Advanced Directives 03/31/2017 11/18/2016 08/30/2016 02/05/2016 02/05/2016 07/10/2015 09/24/2014  Does Patient Have a Medical Advance Directive? Yes Yes No Yes Yes Yes Yes  Type of Paramedic of [place];Out of facility DNR (pink MOST or yellow form) [place];Out of facility DNR (pink MOST or yellow form) - Healthcare Power of [place] -  Does patient want to make changes to medical advance directive? No - Patient declined No - Patient declined - - - No - Patient declined Yes - information given  Copy of [place] in Chart? Yes Yes - Yes Yes Yes -  Pre-existing out of facility DNR order (yellow form or pink MOST form) Yellow form placed in chart (order not valid for inpatient use) Yellow form placed in chart (order not valid for inpatient use) - - - - -    Tobacco Social History   Tobacco Use  Smoking Status Current Every Day Smoker  . Packs/day: 0.25  . Years: 60.00  . Pack years: 15.00  . Types: Cigarettes  . Last attempt to quit: 07/25/2011  . Years since quitting: 5.6  Smokeless Tobacco Never Used  Tobacco Comment   2 cigarette every now and then     Ready to quit: Not Answered Counseling given: Not Answered Comment: 2 cigarette every now and then   Clinical Intake:  Pre-visit preparation completed: No  Pain : No/denies pain     Diabetes: No  How often do you need to have someone help you when you read instructions, pamphlets, or other written materials  from your doctor or pharmacy?: 1 - Never What is the last grade level you completed in school?: doctorate  Interpreter Needed?: No  Information entered by :: Tyson Dense, RN  Past Medical History:  Diagnosis Date  . Anemia   . BPH (benign prostatic hyperplasia)   . CHF (congestive heart failure) ([place])   . Chronic kidney disease   . Dementia in conditions classified elsewhere without behavioral disturbance   . Hyperlipidemia   . Hypertension   . Prostate cancer [facility])    Past Surgical History:  Procedure Laterality Date  . AV FISTULA PLACEMENT Left 08/11/2009   Dr Sherren Mocha [place]  . CYSTOSCOPY  06/21/2003  . PROSTATE BIOPSY  04/01/2000  . PROSTATECTOMY    . SHUNTOGRAM N/A 02/08/2012   Procedure: Earney Mallet;  Surgeon: Serafina Mitchell, MD;  Location: [facility] CATH LAB;  Service: Cardiovascular;  Laterality: N/A;   Family History  Problem Relation Age of Onset  . Stroke Father   . Early death Sister        Some type of accident  . Cancer Sister        Throat  . Diabetes Sister   . Hypertension Sister   . Dementia Sister    Social History   Socioeconomic History  . Marital status: Widowed    Spouse name: Not on file  . Number of children:  Not on file  . Years of education: Not on file  . Highest education level: Not on file  Occupational History  . Not on file  Social Needs  . Financial resource strain: Not hard at all  . Food insecurity:    Worry: Never true    Inability: Never true  . Transportation needs:    Medical: No    Non-medical: No  Tobacco Use  . Smoking status: Current Every Day Smoker    Packs/day: 0.25    Years: 60.00    Pack years: 15.00    Types: Cigarettes    Last attempt to quit: 07/25/2011    Years since quitting: 5.6  . Smokeless tobacco: Never Used  . Tobacco comment: 2 cigarette every now and then  Substance and Sexual Activity  . Alcohol use: No    Alcohol/week: 0.0 oz  . Drug use: No  . Sexual activity: Never  Lifestyle  . Physical activity:     Days per week: 0 days    Minutes per session: 0 min  . Stress: Only a little  Relationships  . Social connections:    Talks on phone: More than three times a week    Gets together: More than three times a week    Attends religious service: Never    Active member of club or organization: No    Attends meetings of clubs or organizations: Never    Relationship status: Widowed  Other Topics Concern  . Not on file  Social History Narrative  . Not on file    Outpatient Encounter Medications as of 03/31/2017  Medication Sig  . aspirin 81 MG tablet Take 81 mg by mouth daily.  . cinacalcet (SENSIPAR) 30 MG tablet Take 30 mg by mouth 3 (three) times a week.  . metoprolol tartrate (LOPRESSOR) 25 MG tablet Take 1/2 tablet on Sunday, Tuesday, Thursday and Saturday. Take 1/2 tablet by mouth at bedtime on Monday, Wednesday and Friday for blood pressure  . Multiple Vitamins-Minerals (CERTA-VITE SENIOR-LUTEIN PO) Take 1 tablet by mouth. Take 1 tablet ([id number])by mouth every day  . RENVELA 800 MG tablet Take 3 tablets by mouth 3 (three) times daily with meals.  . simvastatin (ZOCOR) 10 MG tablet Take 1 tablet by mouth at bedtime.   No facility-administered encounter medications on file as of 03/31/2017.     Activities of Daily Living In your present state of health, do you have any difficulty performing the following activities: 03/31/2017  Hearing? N  Vision? N  Difficulty concentrating or making decisions? Y  Walking or climbing stairs? Y  Dressing or bathing? Y  Doing errands, shopping? Y  Preparing Food and eating ? N  Using the Toilet? N  In the past six months, have you accidently leaked urine? N  Do you have problems with loss of bowel control? Y  Managing your Medications? Y  Managing your Finances? Y  Housekeeping or managing your Housekeeping? Y  Some recent data might be hidden    Patient Care Team: Lauree Chandler, NP as PCP - General (Nurse Practitioner)     Assessment:   This is a routine wellness examination for [name].  Exercise Activities and Dietary recommendations Current Exercise Habits: The patient does not participate in regular exercise at present, Exercise limited by: orthopedic condition(s)  Goals    None      Fall Risk Fall Risk  03/31/2017 11/18/2016 02/05/2016 02/05/2016 07/10/2015  Falls in the past year? No No No No No  Risk for fall due to : - - - Impaired balance/gait -   Is the patient's home free of loose throw rugs in walkways, pet beds, electrical cords, etc?   yes      Grab bars in the bathroom? yes      Handrails on the stairs?   yes      Adequate lighting?   yes  Depression Screen PHQ 2/9 Scores 03/31/2017 11/18/2016 02/05/2016 07/10/2015  PHQ - 2 Score 0 0 0 0    Cognitive Function MMSE - Mini Mental State Exam 03/31/2017 02/05/2016 07/10/2015 04/18/2012  Orientation to time 3 5 5 5   Orientation to Place 4 4 5 5   Registration 3 3 3 3   Attention/ Calculation 5 3 4 5   Recall 0 2 1 2   Language- name 2 objects 2 2 2 2   Language- repeat 1 1 1 1   Language- follow 3 step command 3 3 3 3   Language- read & follow direction 1 1 1 1   Write a sentence 1 1 1 1   Copy design 0 0 0 1  Total score 23 25 26 29         Immunization History  Administered Date(s) Administered  . Influenza Split 10/14/2012  . Influenza-Unspecified 09/29/2016  . Pneumococcal Conjugate-13 10/12/2011, 10/19/2014  . Pneumococcal Polysaccharide-23 02/05/2016  . Td 08/07/2011    Qualifies for Shingles Vaccine? Yes, educated and they will check with facility to give  Screening Tests Health Maintenance  Topic Date Due  . TETANUS/TDAP  08/06/2021  . INFLUENZA VACCINE  Completed  . PNA vac Low Risk Adult  Completed   Cancer Screenings: Lung: Low Dose CT Chest recommended if Age 22-80 years, 30 pack-year currently smoking OR have quit w/in 15years. Patient does not qualify. Colorectal: up to date  Additional Screenings:  Hepatitis C  Screening: declined    Plan:    I have personally reviewed and addressed the Medicare Annual Wellness questionnaire and have noted the following in the patient's chart:  A. Medical and social history B. Use of alcohol, tobacco or illicit drugs  C. Current medications and supplements D. Functional ability and status E.  Nutritional status F.  Physical activity G. Advance directives H. List of other physicians I.  Hospitalizations, surgeries, and ER visits in previous 12 months J.  [place] to include hearing, vision, cognitive, depression L. Referrals and appointments - none  In addition, I have reviewed and discussed with patient certain preventive protocols, quality metrics, and best practice recommendations. A written personalized care plan for preventive services as well as general preventive health recommendations were provided to patient.  See attached scanned questionnaire for additional information.   Signed,   Tyson Dense, RN Nurse Health Advisor  Patient Concerns: Loose stools over last stools

## 2017-05-03 ENCOUNTER — Ambulatory Visit (INDEPENDENT_AMBULATORY_CARE_PROVIDER_SITE_OTHER): Payer: Medicare Other | Admitting: Nurse Practitioner

## 2017-05-03 ENCOUNTER — Encounter: Payer: Self-pay | Admitting: Nurse Practitioner

## 2017-05-03 VITALS — BP 162/84 | HR 74 | Temp 98.4°F | Ht 73.0 in | Wt 155.0 lb

## 2017-05-03 DIAGNOSIS — Z992 Dependence on renal dialysis: Secondary | ICD-10-CM | POA: Diagnosis not present

## 2017-05-03 DIAGNOSIS — N186 End stage renal disease: Secondary | ICD-10-CM | POA: Diagnosis not present

## 2017-05-03 DIAGNOSIS — I10 Essential (primary) hypertension: Secondary | ICD-10-CM | POA: Diagnosis not present

## 2017-05-03 DIAGNOSIS — I5022 Chronic systolic (congestive) heart failure: Secondary | ICD-10-CM | POA: Diagnosis not present

## 2017-05-03 DIAGNOSIS — R32 Unspecified urinary incontinence: Secondary | ICD-10-CM

## 2017-05-03 DIAGNOSIS — E785 Hyperlipidemia, unspecified: Secondary | ICD-10-CM | POA: Diagnosis not present

## 2017-05-03 DIAGNOSIS — R413 Other amnesia: Secondary | ICD-10-CM | POA: Diagnosis not present

## 2017-05-03 NOTE — Progress Notes (Signed)
Careteam: Patient Care Team: Lauree Chandler, NP as PCP - General (Nurse Practitioner)  Advanced Directive information Does Patient Have a Medical Advance Directive?: Yes, Type of Advance Directive: Healthcare Power of Attorney, Pre-existing out of facility DNR order (yellow form or pink MOST form): Yellow form placed in chart (order not valid for inpatient use)  No Known Allergies  Chief Complaint  Patient presents with  . Medical Management of Chronic Issues    Pt is being seen for a 6 month routine visit. Pt has been bleeding from fistula in left upper arm. Pt has also been having nose bleeds.   . Other    Daughter in room  . ACP    Pt has HCPOA and DNR     HPI: Patient is a 84 y.o. male seen in the office today for routine follow up.  Notices blood when he blows his nose. No bloody noses Having issues with incontinence and not bathing- questioning if he needs a higher level of care. The assisted living is not giving him baths or checking to see if he is wet.  Memory loss-  Progressive decline, daughter has noticed that it has worsened but still knows a lot.   ESRD on HD- has been having issues from bleeding from his fistula. On dialysis M,W,F - taking sensipar, doxercalciferol three times weekly  CHF/htn - continues on lopressor, blood pressure has been up the last 2 visit. Pt reports he has low blood pressure when coming off of dialysis at times. Currently on midodrine 10 mg on these days.  Reports his blood pressure is taken at [facility]. 137/81, 129/70, 149/68, 114/68  Hyperlipidemia- taking zocor 10 mg daily   appetite has been good. Weight stable over the last few visit, did have weight loss from January to august 2018 but has been stable since then.   Review of Systems:  Review of Systems  Unable to perform ROS: Dementia    Past Medical History:  Diagnosis Date  . Anemia   . BPH (benign prostatic hyperplasia)   . CHF (congestive heart failure)  ([place])   . Chronic kidney disease   . Dementia in conditions classified elsewhere without behavioral disturbance   . Hyperlipidemia   . Hypertension   . Prostate cancer [facility])    Past Surgical History:  Procedure Laterality Date  . AV FISTULA PLACEMENT Left 08/11/2009   Dr Sherren Mocha Early  . CYSTOSCOPY  06/21/2003  . PROSTATE BIOPSY  04/01/2000  . PROSTATECTOMY    . SHUNTOGRAM N/A 02/08/2012   Procedure: Earney Mallet;  Surgeon: Serafina Mitchell, MD;  Location: [facility] CATH LAB;  Service: Cardiovascular;  Laterality: N/A;   Social History:   reports that he has been smoking cigarettes.  He has a 15.00 pack-year smoking history. He has never used smokeless tobacco. He reports that he does not drink alcohol or use drugs.  Family History  Problem Relation Age of Onset  . Stroke Father   . Early death Sister        Some type of accident  . Cancer Sister        Throat  . Diabetes Sister   . Hypertension Sister   . Dementia Sister     Medications: Patient's Medications  New Prescriptions   No medications on file  Previous Medications   ASPIRIN 81 MG TABLET    Take 81 mg by mouth daily.   CINACALCET (SENSIPAR) 30 MG TABLET    Take 30 mg by mouth  3 (three) times a week.   DOXERCALCIFEROL (HECTOROL) 0.5 MCG CAPSULE    Give 2 capsules by mouth one time a day every Monday, Wednesday, and Friday (given at dialysis).   METOPROLOL TARTRATE (LOPRESSOR) 25 MG TABLET    Take 1/2 tablet on Sunday, Tuesday, Thursday. Take 1/2 tablet by mouth at bedtime for blood pressure   MIDODRINE (PROAMATINE) 10 MG TABLET    Give 1 tablet by mouth one time a day every Monday, Wednesday and Friday for dialysis prophylactic prior to dialysis   MULTIPLE VITAMINS-MINERALS (CERTA-VITE SENIOR-LUTEIN PO)    Take 1 tablet by mouth. Take 1 tablet (4-300-250)by mouth every day   RENVELA 800 MG TABLET    Take 3 tablets by mouth 3 (three) times daily with meals.   SIMVASTATIN (ZOCOR) 10 MG TABLET    Take 1 tablet by mouth at bedtime.    Modified Medications   No medications on file  Discontinued Medications   No medications on file     Physical Exam:  Vitals:   05/03/17 1332  BP: (!) 162/84  Pulse: 74  Temp: 98.4 F (36.9 C)  TempSrc: Oral  SpO2: 97%  Weight: 155 lb (70.3 kg)  Height: 6\' 1"  (1.854 m)   Body mass index is 20.45 kg/m.  Physical Exam  Constitutional: He is oriented to person, place, and time.  Frail chronically ill appearing AA male  HENT:  Head: Normocephalic and atraumatic.  Eyes: Pupils are equal, round, and reactive to light. Conjunctivae are normal.  Left eye deviates medially  Cardiovascular: Normal rate, regular rhythm and normal heart sounds.  Pulmonary/Chest: Effort normal and breath sounds normal. No respiratory distress.  Abdominal: Soft. Bowel sounds are normal. He exhibits no distension. There is no tenderness.  Musculoskeletal: Normal range of motion.  Uses manual wheelchair for mobility  Neurological: He is alert and oriented to person, place, and time.  Skin: Skin is warm and dry.  Dry scaly skin of extremities  Psychiatric: He has a normal mood and affect. His behavior is normal. Cognition and memory are impaired. He exhibits abnormal recent memory.    Labs reviewed: Basic Metabolic Panel: No results for input(s): NA, K, CL, CO2, GLUCOSE, BUN, CREATININE, CALCIUM, MG, PHOS, TSH in the last 8760 hours. Liver Function Tests: No results for input(s): AST, ALT, ALKPHOS, BILITOT, PROT, ALBUMIN in the last 8760 hours. No results for input(s): LIPASE, AMYLASE in the last 8760 hours. No results for input(s): AMMONIA in the last 8760 hours. CBC: No results for input(s): WBC, NEUTROABS, HGB, HCT, MCV, PLT in the last 8760 hours. Lipid Panel: No results for input(s): CHOL, HDL, LDLCALC, TRIG, CHOLHDL, LDLDIRECT in the last 8760 hours. TSH: No results for input(s): TSH in the last 8760 hours. A1C: Lab Results  Component Value Date   HGBA1C  11/26/2006    5.3 (NOTE)    The ADA recommends the following therapeutic goals for glycemic   control related to Hgb A1C measurement:   Goal of Therapy:   < 7.0% Hgb A1C   Action Suggested:  > 8.0% Hgb A1C   Ref:  Diabetes Care, 22, Suppl. 1, 1999     Assessment/Plan 1. Memory loss due to Vascular dementia without behavioral disturbance Progressive decline in memory, more resistant to care, daughters feel it may be time to transition to SNF - Ambulatory referral to [place] for assistance on finding facility   2. ESRD on dialysis ([place]) Continues on HD on M/W/F  3. Chronic systolic congestive  heart failure (HCC) euvolemic at this time, continues on lopressor  4. Essential hypertension -elevated at visit but called facility and review of blood pressures appear well controlled  5. Hyperlipidemia, unspecified hyperlipidemia type Last LDL at goal, due to age and after discussion with pt and daughter will stop zocor at this time  7. Urinary incontinence, unspecified type -encouraged scheduled toileting, pt on HD so has irregular bathroom trips.   Next appt: 6 months if does not transition to SNF.  Carlos American. [place], [facility] [phone]

## 2017-05-19 ENCOUNTER — Ambulatory Visit: Payer: Medicare Other | Admitting: Nurse Practitioner

## 2017-06-20 ENCOUNTER — Emergency Department (HOSPITAL_COMMUNITY): Payer: Medicare Other

## 2017-06-20 ENCOUNTER — Encounter (HOSPITAL_COMMUNITY)
Admission: EM | Disposition: A | Discharge status: Hospice | Payer: Self-pay | Source: Home / Self Care | Attending: Family Medicine

## 2017-06-20 ENCOUNTER — Encounter (HOSPITAL_COMMUNITY): Payer: Self-pay

## 2017-06-20 ENCOUNTER — Inpatient Hospital Stay (HOSPITAL_COMMUNITY)
Admission: EM | Admit: 2017-06-20 | Discharge: 2017-06-27 | DRG: 377 | Disposition: A | Discharge status: Hospice | Payer: Medicare Other | Attending: Family Medicine | Admitting: Family Medicine

## 2017-06-20 ENCOUNTER — Inpatient Hospital Stay (HOSPITAL_COMMUNITY): Payer: Medicare Other

## 2017-06-20 DIAGNOSIS — Z515 Encounter for palliative care: Secondary | ICD-10-CM | POA: Diagnosis not present

## 2017-06-20 DIAGNOSIS — K5521 Angiodysplasia of colon with hemorrhage: Secondary | ICD-10-CM | POA: Diagnosis present

## 2017-06-20 DIAGNOSIS — K31811 Angiodysplasia of stomach and duodenum with bleeding: Secondary | ICD-10-CM | POA: Diagnosis present

## 2017-06-20 DIAGNOSIS — D62 Acute posthemorrhagic anemia: Secondary | ICD-10-CM | POA: Diagnosis present

## 2017-06-20 DIAGNOSIS — F17210 Nicotine dependence, cigarettes, uncomplicated: Secondary | ICD-10-CM | POA: Diagnosis present

## 2017-06-20 DIAGNOSIS — Z8546 Personal history of malignant neoplasm of prostate: Secondary | ICD-10-CM | POA: Diagnosis not present

## 2017-06-20 DIAGNOSIS — R1311 Dysphagia, oral phase: Secondary | ICD-10-CM | POA: Diagnosis present

## 2017-06-20 DIAGNOSIS — I5022 Chronic systolic (congestive) heart failure: Secondary | ICD-10-CM | POA: Diagnosis present

## 2017-06-20 DIAGNOSIS — Z8249 Family history of ischemic heart disease and other diseases of the circulatory system: Secondary | ICD-10-CM

## 2017-06-20 DIAGNOSIS — K2210 Ulcer of esophagus without bleeding: Secondary | ICD-10-CM | POA: Diagnosis present

## 2017-06-20 DIAGNOSIS — Z9079 Acquired absence of other genital organ(s): Secondary | ICD-10-CM

## 2017-06-20 DIAGNOSIS — F05 Delirium due to known physiological condition: Secondary | ICD-10-CM | POA: Diagnosis present

## 2017-06-20 DIAGNOSIS — G9341 Metabolic encephalopathy: Secondary | ICD-10-CM | POA: Diagnosis present

## 2017-06-20 DIAGNOSIS — K922 Gastrointestinal hemorrhage, unspecified: Secondary | ICD-10-CM

## 2017-06-20 DIAGNOSIS — R578 Other shock: Secondary | ICD-10-CM | POA: Diagnosis present

## 2017-06-20 DIAGNOSIS — Z66 Do not resuscitate: Secondary | ICD-10-CM | POA: Diagnosis present

## 2017-06-20 DIAGNOSIS — R1310 Dysphagia, unspecified: Secondary | ICD-10-CM | POA: Diagnosis not present

## 2017-06-20 DIAGNOSIS — N400 Enlarged prostate without lower urinary tract symptoms: Secondary | ICD-10-CM | POA: Diagnosis present

## 2017-06-20 DIAGNOSIS — Z7982 Long term (current) use of aspirin: Secondary | ICD-10-CM | POA: Diagnosis not present

## 2017-06-20 DIAGNOSIS — I700 Atherosclerosis of aorta: Secondary | ICD-10-CM | POA: Diagnosis not present

## 2017-06-20 DIAGNOSIS — F0150 Vascular dementia without behavioral disturbance: Secondary | ICD-10-CM | POA: Diagnosis not present

## 2017-06-20 DIAGNOSIS — Z823 Family history of stroke: Secondary | ICD-10-CM | POA: Diagnosis not present

## 2017-06-20 DIAGNOSIS — D649 Anemia, unspecified: Secondary | ICD-10-CM | POA: Diagnosis not present

## 2017-06-20 DIAGNOSIS — E785 Hyperlipidemia, unspecified: Secondary | ICD-10-CM | POA: Diagnosis present

## 2017-06-20 DIAGNOSIS — J449 Chronic obstructive pulmonary disease, unspecified: Secondary | ICD-10-CM | POA: Diagnosis present

## 2017-06-20 DIAGNOSIS — I132 Hypertensive heart and chronic kidney disease with heart failure and with stage 5 chronic kidney disease, or end stage renal disease: Secondary | ICD-10-CM | POA: Diagnosis present

## 2017-06-20 DIAGNOSIS — E875 Hyperkalemia: Secondary | ICD-10-CM | POA: Diagnosis not present

## 2017-06-20 DIAGNOSIS — D696 Thrombocytopenia, unspecified: Secondary | ICD-10-CM

## 2017-06-20 DIAGNOSIS — R4182 Altered mental status, unspecified: Secondary | ICD-10-CM | POA: Diagnosis present

## 2017-06-20 DIAGNOSIS — E872 Acidosis, unspecified: Secondary | ICD-10-CM

## 2017-06-20 DIAGNOSIS — N186 End stage renal disease: Secondary | ICD-10-CM | POA: Diagnosis present

## 2017-06-20 DIAGNOSIS — B370 Candidal stomatitis: Secondary | ICD-10-CM | POA: Diagnosis present

## 2017-06-20 DIAGNOSIS — Z833 Family history of diabetes mellitus: Secondary | ICD-10-CM

## 2017-06-20 DIAGNOSIS — K5710 Diverticulosis of small intestine without perforation or abscess without bleeding: Secondary | ICD-10-CM | POA: Diagnosis present

## 2017-06-20 DIAGNOSIS — K921 Melena: Secondary | ICD-10-CM | POA: Diagnosis not present

## 2017-06-20 DIAGNOSIS — Z7189 Other specified counseling: Secondary | ICD-10-CM | POA: Diagnosis not present

## 2017-06-20 DIAGNOSIS — Z992 Dependence on renal dialysis: Secondary | ICD-10-CM

## 2017-06-20 DIAGNOSIS — Z809 Family history of malignant neoplasm, unspecified: Secondary | ICD-10-CM

## 2017-06-20 DIAGNOSIS — D6959 Other secondary thrombocytopenia: Secondary | ICD-10-CM | POA: Diagnosis present

## 2017-06-20 DIAGNOSIS — R627 Adult failure to thrive: Secondary | ICD-10-CM | POA: Diagnosis not present

## 2017-06-20 DIAGNOSIS — Z781 Physical restraint status: Secondary | ICD-10-CM

## 2017-06-20 DIAGNOSIS — D631 Anemia in chronic kidney disease: Secondary | ICD-10-CM | POA: Diagnosis present

## 2017-06-20 LAB — CBC WITH DIFFERENTIAL/PLATELET
Abs Immature Granulocytes: 0.1 10*3/uL (ref 0.0–0.1)
BASOS ABS: 0 10*3/uL (ref 0.0–0.1)
BASOS PCT: 0 %
Eosinophils Absolute: 0.1 10*3/uL (ref 0.0–0.7)
Eosinophils Relative: 1 %
HCT: 16.8 % — ABNORMAL LOW (ref 39.0–52.0)
Hemoglobin: 5.1 g/dL — CL (ref 13.0–17.0)
IMMATURE GRANULOCYTES: 1 %
Lymphocytes Relative: 8 %
Lymphs Abs: 0.8 10*3/uL (ref 0.7–4.0)
MCH: 29 pg (ref 26.0–34.0)
MCHC: 30.4 g/dL (ref 30.0–36.0)
MCV: 95.5 fL (ref 78.0–100.0)
Monocytes Absolute: 0.3 10*3/uL (ref 0.1–1.0)
Monocytes Relative: 3 %
NEUTROS PCT: 87 %
Neutro Abs: 9 10*3/uL — ABNORMAL HIGH (ref 1.7–7.7)
PLATELETS: 136 10*3/uL — AB (ref 150–400)
RBC: 1.76 MIL/uL — AB (ref 4.22–5.81)
RDW: 17.2 % — AB (ref 11.5–15.5)
WBC: 10.3 10*3/uL (ref 4.0–10.5)

## 2017-06-20 LAB — I-STAT CHEM 8, ED
BUN: 124 mg/dL — ABNORMAL HIGH (ref 6–20)
CHLORIDE: 101 mmol/L (ref 101–111)
Calcium, Ion: 1.09 mmol/L — ABNORMAL LOW (ref 1.15–1.40)
Creatinine, Ser: 8.9 mg/dL — ABNORMAL HIGH (ref 0.61–1.24)
GLUCOSE: 111 mg/dL — AB (ref 65–99)
HEMATOCRIT: 18 % — AB (ref 39.0–52.0)
Hemoglobin: 6.1 g/dL — CL (ref 13.0–17.0)
POTASSIUM: 5.6 mmol/L — AB (ref 3.5–5.1)
SODIUM: 140 mmol/L (ref 135–145)
TCO2: 25 mmol/L (ref 22–32)

## 2017-06-20 LAB — FOLATE: Folate: 16.8 ng/mL (ref >5.9)

## 2017-06-20 LAB — RETICULOCYTES
RBC.: 1.68 MIL/uL — ABNORMAL LOW (ref 4.22–5.81)
Retic Count, Absolute: 35.3 10*3/uL (ref 19.0–186.0)
Retic Ct Pct: 2.1 % (ref 0.4–3.1)

## 2017-06-20 LAB — I-STAT VENOUS BLOOD GAS, ED
Acid-base deficit: 2 mmol/L (ref 0.0–2.0)
BICARBONATE: 22.3 mmol/L (ref 20.0–28.0)
O2 Saturation: 28 %
PCO2 VEN: 35.2 mmHg — AB (ref 44.0–60.0)
PH VEN: 7.41 (ref 7.250–7.430)
TCO2: 23 mmol/L (ref 22–32)
pO2, Ven: 18 mmHg — CL (ref 32.0–45.0)

## 2017-06-20 LAB — COMPREHENSIVE METABOLIC PANEL
ALK PHOS: 63 U/L (ref 38–126)
ALT: 9 U/L — AB (ref 17–63)
ANION GAP: 19 — AB (ref 5–15)
AST: 19 U/L (ref 15–41)
Albumin: 2.7 g/dL — ABNORMAL LOW (ref 3.5–5.0)
BUN: 133 mg/dL — ABNORMAL HIGH (ref 6–20)
CALCIUM: 8.9 mg/dL (ref 8.9–10.3)
CO2: 22 mmol/L (ref 22–32)
Chloride: 102 mmol/L (ref 101–111)
Creatinine, Ser: 8.63 mg/dL — ABNORMAL HIGH (ref 0.61–1.24)
GFR, EST AFRICAN AMERICAN: 6 mL/min — AB (ref >60)
GFR, EST NON AFRICAN AMERICAN: 5 mL/min — AB (ref >60)
Glucose, Bld: 112 mg/dL — ABNORMAL HIGH (ref 65–99)
Potassium: 5.8 mmol/L — ABNORMAL HIGH (ref 3.5–5.1)
Sodium: 143 mmol/L (ref 135–145)
TOTAL PROTEIN: 5.7 g/dL — AB (ref 6.5–8.1)
Total Bilirubin: 0.5 mg/dL (ref 0.3–1.2)

## 2017-06-20 LAB — URINALYSIS, COMPLETE (UACMP) WITH MICROSCOPIC: RBC / HPF: >50 RBC/hpf — ABNORMAL HIGH (ref 0–5)

## 2017-06-20 LAB — PROTIME-INR
INR: 1.26
Prothrombin Time: 15.7 seconds — ABNORMAL HIGH (ref 11.4–15.2)

## 2017-06-20 LAB — IRON AND TIBC
Iron: 89 ug/dL (ref 45–182)
SATURATION RATIOS: 47 % — AB (ref 17.9–39.5)
TIBC: 188 ug/dL — AB (ref 250–450)
UIBC: 99 ug/dL

## 2017-06-20 LAB — I-STAT TROPONIN, ED: TROPONIN I, POC: 0.07 ng/mL (ref 0.00–0.08)

## 2017-06-20 LAB — I-STAT CG4 LACTIC ACID, ED
LACTIC ACID, VENOUS: 6.49 mmol/L — AB (ref 0.5–1.9)
Lactic Acid, Venous: 5.02 mmol/L (ref 0.5–1.9)

## 2017-06-20 LAB — AMMONIA: Ammonia: 20 umol/L (ref 9–35)

## 2017-06-20 LAB — POC OCCULT BLOOD, ED: Fecal Occult Bld: POSITIVE — AB

## 2017-06-20 LAB — TROPONIN I: Troponin I: 0.05 ng/mL (ref <0.03)

## 2017-06-20 LAB — ETHANOL: Alcohol, Ethyl (B): <10 mg/dL (ref <10)

## 2017-06-20 LAB — PREPARE RBC (CROSSMATCH)

## 2017-06-20 LAB — FERRITIN: Ferritin: >7500 ng/mL — ABNORMAL HIGH (ref 24–336)

## 2017-06-20 LAB — VITAMIN B12: VITAMIN B 12: 1252 pg/mL — AB (ref 180–914)

## 2017-06-20 SURGERY — LEFT HEART CATH AND CORONARY ANGIOGRAPHY
Anesthesia: LOCAL

## 2017-06-20 MED ORDER — PANTOPRAZOLE SODIUM 40 MG IV SOLR: 40.0000 mg | Freq: Two times a day (BID) | INTRAVENOUS | Status: DC

## 2017-06-20 MED ORDER — PANTOPRAZOLE SODIUM 40 MG IV SOLR
40.0000 mg | Freq: Once | INTRAVENOUS | Status: AC
  Administered 2017-06-20: 40 mg via INTRAVENOUS
  Filled 2017-06-20: qty 40

## 2017-06-20 MED ORDER — SIMVASTATIN 10 MG PO TABS
10.0000 mg | ORAL_TABLET | Freq: Every day | ORAL | Status: DC
  Administered 2017-06-20 – 2017-06-26 (×5): 10 mg via ORAL
  Filled 2017-06-20 (×7): qty 1

## 2017-06-20 MED ORDER — ONDANSETRON HCL 4 MG/2ML IJ SOLN
4.0000 mg | Freq: Once | INTRAMUSCULAR | Status: AC
  Administered 2017-06-20: 4 mg via INTRAVENOUS
  Filled 2017-06-20: qty 2

## 2017-06-20 MED ORDER — SODIUM CHLORIDE 0.9% FLUSH
3.0000 mL | Freq: Two times a day (BID) | INTRAVENOUS | Status: DC
  Administered 2017-06-20 – 2017-06-27 (×11): 3 mL via INTRAVENOUS

## 2017-06-20 MED ORDER — SODIUM CHLORIDE 0.9 % IV SOLN
8.0000 mg/h | INTRAVENOUS | Status: AC
  Administered 2017-06-20 – 2017-06-23 (×6): 8 mg/h via INTRAVENOUS
  Filled 2017-06-20 (×12): qty 80

## 2017-06-20 MED ORDER — SODIUM CHLORIDE 0.9 % IV SOLN: 80.0000 mg | Freq: Once | INTRAVENOUS | Status: DC

## 2017-06-20 MED ORDER — MIDODRINE HCL 5 MG PO TABS
10.0000 mg | ORAL_TABLET | ORAL | Status: DC
  Administered 2017-06-24: 10 mg via ORAL

## 2017-06-20 MED ORDER — HALOPERIDOL LACTATE 5 MG/ML IJ SOLN
5.0000 mg | Freq: Once | INTRAMUSCULAR | Status: AC
  Administered 2017-06-20: 5 mg via INTRAVENOUS
  Filled 2017-06-20: qty 1

## 2017-06-20 MED ORDER — SODIUM CHLORIDE 0.9 % IV SOLN
Freq: Once | INTRAVENOUS | Status: AC
  Administered 2017-06-20: 15:00:00 via INTRAVENOUS

## 2017-06-20 MED ORDER — SODIUM CHLORIDE 0.9 % IV BOLUS
500.0000 mL | Freq: Once | INTRAVENOUS | Status: AC
  Administered 2017-06-20: 500 mL via INTRAVENOUS

## 2017-06-20 MED ORDER — SODIUM POLYSTYRENE SULFONATE 15 GM/60ML PO SUSP
45.0000 g | Freq: Once | ORAL | Status: AC
  Administered 2017-06-20: 45 g via ORAL
  Filled 2017-06-20: qty 180

## 2017-06-20 MED ORDER — SEVELAMER CARBONATE 800 MG PO TABS
2400.0000 mg | ORAL_TABLET | Freq: Three times a day (TID) | ORAL | Status: DC
  Administered 2017-06-20 – 2017-06-27 (×9): 2400 mg via ORAL
  Filled 2017-06-20 (×14): qty 3

## 2017-06-20 NOTE — ED Notes (Signed)
VBG results reported to Dr. Regenia Skeeter

## 2017-06-20 NOTE — Consult Note (Signed)
[name]  is a 84 y.o male with ESRD who receives HD on MWF schedule at the [facility] who lives in assisted living facility and went to dialysis today where he was found to be confused and hypotensive, and he was sent to the ED where hemoglobin of 5.1 was noted.  He reportedly had dark black stool in the ED was Hemoccult positive, and had 2 coffee ground episodes of emesis in the ER.  K was 5.6.   Past Medical History:  Diagnosis Date  . Anemia   . BPH (benign prostatic hyperplasia)   . CHF (congestive heart failure) ([place])   . Chronic kidney disease   . Dementia in conditions classified elsewhere without behavioral disturbance   . Hyperlipidemia   . Hypertension   . Prostate cancer [facility])    Past Surgical History:  Procedure Laterality Date  . AV FISTULA PLACEMENT Left 08/11/2009   Dr Sherren Mocha Early  . CYSTOSCOPY  06/21/2003  . PROSTATE BIOPSY  04/01/2000  . PROSTATECTOMY    . SHUNTOGRAM N/A 02/08/2012   Procedure: Earney Mallet;  Surgeon: Serafina Mitchell, MD;  Location[facility] CATH LAB;  Service: Cardiovascular;  Laterality: N/A;   Social History:  reports that he has been smoking cigarettes.  He has a 15.00 pack-year smoking history. He has never used smokeless tobacco. He reports that he does not drink alcohol or use drugs. Allergies: No Known Allergies Family History  Problem Relation Age of Onset  . Stroke Father   . Early death Sister        Some type of accident  . Cancer Sister        Throat  . Diabetes Sister   . Hypertension Sister   . Dementia Sister     Medications:  Scheduled: . [START ON 06/22/2017] midodrine  10 mg Oral Q M,W,F-HD  . sevelamer carbonate  2,400 mg Oral TID WC  . simvastatin  10 mg Oral QHS  . sodium chloride flush  3 mL Intravenous Q12H    ROS: as pwer HPI, but more confused now Blood pressure (!) 109/34, pulse 91, temperature 97.9 F (36.6 C), temperature source Oral, resp. rate 18, weight 66 kg (145 lb 6.4 oz), SpO2 100 %.  General appearance: alert  and pleasantly confused Head: Normocephalic, without obvious abnormality, atraumatic Eyes: negative Ears: normal TM's and external ear canals both ears Nose: Nares normal. Septum midline. Mucosa normal. No drainage or sinus tenderness. Throat: lips, mucosa, and tongue normal; teeth and gums normal Resp: clear to auscultation bilaterally Chest wall: no tenderness Cardio: regular rate and rhythm, S1, S2 normal, no murmur, click, rub or gallop GI: soft, non-tender; bowel sounds normal; no masses,  no organomegaly Extremities: edema, LUE AVF Skin: Skin color, texture, turgor normal. No rashes or lesions Neurologic: Mental status: answers yes to many things. pleasant Results for orders placed or performed during the hospital encounter of 06/20/17 (from the past 48 hour(s))  Comprehensive metabolic panel     Status: Abnormal   Collection Time: 06/20/17  1:32 PM  Result Value Ref Range   Sodium 143 135 - 145 mmol/L   Potassium 5.8 (H) 3.5 - 5.1 mmol/L   Chloride 102 101 - 111 mmol/L   CO2 22 22 - 32 mmol/L   Glucose, Bld 112 (H) 65 - 99 mg/dL   BUN 133 (H) 6 - 20 mg/dL   Creatinine, Ser 8.63 (H) 0.61 - 1.24 mg/dL   Calcium 8.9 8.9 - 10.3 mg/dL   Total Protein 5.7 (  L) 6.5 - 8.1 g/dL   Albumin 2.7 (L) 3.5 - 5.0 g/dL   AST 19 15 - 41 U/L   ALT 9 (L) 17 - 63 U/L   Alkaline Phosphatase 63 38 - 126 U/L   Total Bilirubin 0.5 0.3 - 1.2 mg/dL   GFR calc non Af Amer 5 (L) >60 mL/min   GFR calc Af Amer 6 (L) >60 mL/min    Comment: (NOTE) The eGFR has been calculated using the CKD EPI equation. This calculation has not been validated in all clinical situations. eGFR's persistently <60 mL/min signify possible Chronic Kidney Disease.    Anion gap 19 (H) 5 - 15    Comment: Performed at [facility], [place] [address], [place], [place] [address]  CBC WITH DIFFERENTIAL     Status: Abnormal   Collection Time: 06/20/17  1:32 PM  Result Value Ref Range   WBC 10.3 4.0 - 10.5 K/uL   RBC 1.76 (L)  4.22 - 5.81 MIL/uL   Hemoglobin 5.1 (LL) 13.0 - 17.0 g/dL    Comment: REPEATED TO VERIFY CRITICAL RESULT CALLED TO, READ BACK BY AND VERIFIED WITH: C GROSE RN @ 1407 ON 06/20/17 BY HTEMOCHE    HCT 16.8 (L) 39.0 - 52.0 %   MCV 95.5 78.0 - 100.0 fL   MCH 29.0 26.0 - 34.0 pg   MCHC 30.4 30.0 - 36.0 g/dL   RDW 17.2 (H) 11.5 - 15.5 %   Platelets 136 (L) 150 - 400 K/uL   Neutrophils Relative % 87 %   Neutro Abs 9.0 (H) 1.7 - 7.7 K/uL   Lymphocytes Relative 8 %   Lymphs Abs 0.8 0.7 - 4.0 K/uL   Monocytes Relative 3 %   Monocytes Absolute 0.3 0.1 - 1.0 K/uL   Eosinophils Relative 1 %   Eosinophils Absolute 0.1 0.0 - 0.7 K/uL   Basophils Relative 0 %   Basophils Absolute 0.0 0.0 - 0.1 K/uL   Immature Granulocytes 1 %   Abs Immature Granulocytes 0.1 0.0 - 0.1 K/uL    Comment: Performed at [facility], [place] [address], [place], [place] [address]  Troponin I     Status: Abnormal   Collection Time: 06/20/17  1:32 PM  Result Value Ref Range   Troponin I 0.05 (HH) <0.03 ng/mL    Comment: CRITICAL RESULT CALLED TO, READ BACK BY AND VERIFIED WITH: B DAVIS,RN 1542 06/20/2017 WBOND Performed at [facility], [place] [address] [place], [place] [address]   I-Stat Troponin, ED (not at [facility])     Status: None   Collection Time: 06/20/17  1:55 PM  Result Value Ref Range   Troponin i, poc 0.07 0.00 - 0.08 ng/mL   Comment 3            Comment: Due to the release kinetics of cTnI, a negative result within the first hours of the onset of symptoms does not rule out myocardial infarction with certainty. If myocardial infarction is still suspected, repeat the test at appropriate intervals.   I-Stat Chem 8, ED     Status: Abnormal   Collection Time: 06/20/17  1:56 PM  Result Value Ref Range   Sodium 140 135 - 145 mmol/L   Potassium 5.6 (H) 3.5 - 5.1 mmol/L   Chloride 101 101 - 111 mmol/L   BUN 124 (H) 6 - 20 mg/dL   Creatinine, Ser 8.90 (H) 0.61 - 1.24 mg/dL   Glucose, Bld 111 (H) 65 -  99 mg/dL   Calcium, Ion 1.09 (L) 1.15 - 1.40 mmol/L   TCO2 25 22 - 32 mmol/L   Hemoglobin 6.1 (LL) 13.0 - 17.0 g/dL   HCT 18.0 (L) 39.0 - 52.0 %   Comment NOTIFIED PHYSICIAN   I-Stat CG4 Lactic Acid, ED     Status: Abnormal   Collection Time: 06/20/17  1:57 PM  Result Value Ref Range   Lactic Acid, Venous 5.02 (HH) 0.5 - 1.9 mmol/L   Comment NOTIFIED PHYSICIAN   Prepare RBC     Status: None   Collection Time: 06/20/17  2:21 PM  Result Value Ref Range   Order Confirmation      ORDER PROCESSED BY BLOOD BANK Performed at [facility], [place] [address], [place], [place] [address]   Ammonia     Status: None   Collection Time: 06/20/17  2:38 PM  Result Value Ref Range   Ammonia 20 9 - 35 umol/L    Comment: Performed at [facility], [place] [address] [place], [place] [address]  Type and screen [place]     Status: None (Preliminary result)   Collection Time: 06/20/17  2:38 PM  Result Value Ref Range   ABO/RH(D) B POS    Antibody Screen NEG    Sample Expiration 06/23/2017    Unit Number [id number]    Blood Component Type RED CELLS,LR    Unit division 00    Status of Unit ISSUED    Transfusion Status OK TO TRANSFUSE    Crossmatch Result Compatible    Unit Number [id number]    Blood Component Type RED CELLS,LR    Unit division 00    Status of Unit ISSUED    Transfusion Status OK TO TRANSFUSE    Crossmatch Result      Compatible Performed at [facility], [place] [address] [place], [place] [address]   Vitamin B12     Status: Abnormal   Collection Time: 06/20/17  2:44 PM  Result Value Ref Range   Vitamin B-12 1,252 (H) 180 - 914 pg/mL    Comment: (NOTE) This assay is not validated for testing neonatal or myeloproliferative syndrome specimens for Vitamin B12 levels. Performed at [facility], [place] [address] [place], [place] [address]   Folate     Status: None   Collection Time: 06/20/17  2:44 PM  Result Value Ref Range   Folate  16.8 >5.9 ng/mL    Comment: Performed at [facility], [place] [address], [place], [place] [address]  Iron and TIBC     Status: Abnormal   Collection Time: 06/20/17  2:44 PM  Result Value Ref Range   Iron 89 45 - 182 ug/dL   TIBC 188 (L) 250 - 450 ug/dL   Saturation Ratios 47 (H) 17.9 - 39.5 %   UIBC 99 ug/dL    Comment: Performed at [place] [address] [place], [place] [address]  Ferritin     Status: Abnormal   Collection Time: 06/20/17  2:44 PM  Result Value Ref Range   Ferritin >7,500 (H) 24 - 336 ng/mL    Comment: Performed at [place] [address], [place], [place] [address]  Reticulocytes     Status: Abnormal   Collection Time: 06/20/17  2:44 PM  Result Value Ref Range   Retic Ct Pct 2.1 0.4 - 3.1 %   RBC. 1.68 (L) 4.22 - 5.81 MIL/uL  Retic Count, Absolute 35.3 19.0 - 186.0 K/uL    Comment: Performed at [facility], [place] [address] [place], [place] [address]  Ethanol     Status: None   Collection Time: 06/20/17  2:45 PM  Result Value Ref Range   Alcohol, Ethyl (B) <10 <10 mg/dL    Comment: (NOTE) Lowest detectable limit for serum alcohol is 10 mg/dL. For medical purposes only. Performed at [facility], [place] [address] [place], [place] [address]   POC occult blood, ED Provider will collect     Status: Abnormal   Collection Time: 06/20/17  2:46 PM  Result Value Ref Range   Fecal Occult Bld POSITIVE (A) NEGATIVE  I-Stat venous blood gas, ED     Status: Abnormal   Collection Time: 06/20/17  2:56 PM  Result Value Ref Range   pH, Ven 7.410 7.250 - 7.430   pCO2, Ven 35.2 (L) 44.0 - 60.0 mmHg   pO2, Ven 18.0 (LL) 32.0 - 45.0 mmHg   Bicarbonate 22.3 20.0 - 28.0 mmol/L   TCO2 23 22 - 32 mmol/L   O2 Saturation 28.0 %   Acid-base deficit 2.0 0.0 - 2.0 mmol/L   Patient temperature HIDE    Sample type VENOUS   I-Stat CG4 Lactic Acid, ED     Status: Abnormal   Collection Time: 06/20/17  4:50 PM  Result Value Ref Range   Lactic  Acid, Venous 6.49 (HH) 0.5 - 1.9 mmol/L   Comment NOTIFIED PHYSICIAN   Urinalysis, Complete w Microscopic     Status: Abnormal   Collection Time: 06/20/17  7:32 PM  Result Value Ref Range   Color, Urine RED (A) YELLOW   APPearance TURBID (A) CLEAR   Specific Gravity, Urine  1.005 - 1.030    TEST NOT REPORTED DUE TO COLOR INTERFERENCE OF URINE PIGMENT   pH  5.0 - 8.0    TEST NOT REPORTED DUE TO COLOR INTERFERENCE OF URINE PIGMENT   Glucose, UA (A) NEGATIVE mg/dL    TEST NOT REPORTED DUE TO COLOR INTERFERENCE OF URINE PIGMENT   Hgb urine dipstick (A) NEGATIVE    TEST NOT REPORTED DUE TO COLOR INTERFERENCE OF URINE PIGMENT   Bilirubin Urine (A) NEGATIVE    TEST NOT REPORTED DUE TO COLOR INTERFERENCE OF URINE PIGMENT   Ketones, ur (A) NEGATIVE mg/dL    TEST NOT REPORTED DUE TO COLOR INTERFERENCE OF URINE PIGMENT   Protein, ur (A) NEGATIVE mg/dL    TEST NOT REPORTED DUE TO COLOR INTERFERENCE OF URINE PIGMENT   Nitrite (A) NEGATIVE    TEST NOT REPORTED DUE TO COLOR INTERFERENCE OF URINE PIGMENT   Leukocytes, UA (A) NEGATIVE    TEST NOT REPORTED DUE TO COLOR INTERFERENCE OF URINE PIGMENT   RBC / HPF >50 (H) 0 - 5 RBC/hpf   WBC, UA 6-10 0 - 5 WBC/hpf   Bacteria, UA MANY (A) NONE SEEN   Squamous Epithelial / LPF 11-20 0 - 5   WBC Clumps PRESENT     Comment: Performed at [facility], [place] [address], [place], [place] [address]   Ct Head Wo Contrast  Result Date: 06/20/2017 CLINICAL DATA:  Hypotension and altered mental status from dialysis today. EXAM: CT HEAD WITHOUT CONTRAST TECHNIQUE: Contiguous axial images were obtained from the base of the skull through the vertex without intravenous contrast. COMPARISON:  04/12/2011 FINDINGS: BRAIN: There is sulcal and ventricular prominence consistent with superficial and central atrophy. No intraparenchymal hemorrhage,  mass effect nor midline shift. Periventricular and subcortical white matter hypodensities consistent with chronic small vessel  ischemic disease are identified. No acute large vascular territory infarcts. No abnormal extra-axial fluid collections. Basal cisterns are not effaced and midline. VASCULAR: Moderate calcific atherosclerosis of the carotid siphons. SKULL: No skull fracture. No significant scalp soft tissue swelling. SINUSES/ORBITS: The mastoid air-cells are clear. Polypoid mucous retention cyst along the floor the right maxillary sinus with mild to moderate circumferential mucosal thickening of the left maxillary sinus. Mild ethmoid sinus mucosal thickening is also noted. The frontal and sphenoid sinuses are unremarkable. The included ocular globes and orbital contents are non-suspicious. OTHER: None. IMPRESSION: Atrophy with chronic small vessel ischemic disease. No acute intracranial findings. Electronically Signed   By: Ashley Royalty M.D.   On: 06/20/2017 18:04   Dg Chest Port 1 View  Result Date: 06/20/2017 CLINICAL DATA:  Shortness of breath, hypotension, dialysis patient EXAM: PORTABLE CHEST 1 VIEW COMPARISON:  02/28/2014, 02/18/2011 FINDINGS: Mild cardiomegaly without CHF, edema or effusions. No definite focal pneumonia, collapse or consolidation. Trachea is midline. Negative for pneumothorax. Aorta is atherosclerotic. Stable paratracheal soft tissue density, suspect related to tortuous vasculature. IMPRESSION: Cardiomegaly without acute chest process.  Stable exam. Atherosclerosis Electronically Signed   By: Jerilynn Mages.  Shick M.D.   On: 06/20/2017 13:56    Assessment:  1 ESRD 2 Severe ABLA due to UGI bleed 3 Hemorrhagic shock 4 Hyperkalemia  Plan: 1 PRBCs peripherally being infused 2 Kayexalate PO 3 Dialysis as needed---reevaluate in AM  Estanislado Emms, MD 06/20/2017, 10:36 PM

## 2017-06-20 NOTE — Progress Notes (Signed)
Pt is agitated and trying to pull the IV out. MD on call paged and ordered a one time dose Haldol 5 mg IV. Will continue to monitor pt.

## 2017-06-20 NOTE — ED Notes (Signed)
Per MD, Place warm blankets and Incr Room temp to 19F and recheck Rectal temp

## 2017-06-20 NOTE — ED Notes (Signed)
Attempted IV x 2. [place] RN attempted IV x 2.

## 2017-06-20 NOTE — ED Notes (Signed)
Lab called. Pt Trop is 0.05

## 2017-06-20 NOTE — ED Provider Notes (Addendum)
[facility] EMERGENCY DEPARTMENT Provider Note   CSN: [id number] Arrival date & time: 06/20/17  1322  LEVEL 5 CAVEAT - ALTERED MENTAL STATUS   History   Chief Complaint Chief Complaint  Patient presents with  . Altered Mental Status    HPI Jimy Gates is a 84 y.o. male.  HPI  84 year old male brought in by EMS for altered mental status.  A code STEMI was called in the field based on ECG changes.  The patient has not been complaining of chest pain but the history is quite limited due to the altered mental state.  The history from EMS is that he went to dialysis but did not receive dialysis today because they evaluated him and he was altered.  No other history is available at this time.  Past Medical History:  Diagnosis Date  . Anemia   . BPH (benign prostatic hyperplasia)   . CHF (congestive heart failure) ([place])   . Chronic kidney disease   . Dementia in conditions classified elsewhere without behavioral disturbance   . Hyperlipidemia   . Hypertension   . Prostate cancer [facility])     Patient Active Problem List   Diagnosis Date Noted  . Vascular dementia without behavioral disturbance 11/22/2016  . Chronic obstructive pulmonary disease ([place]) 11/18/2016  . ESRD on dialysis ([place]) 06/05/2013  . Chronic systolic congestive heart failure ([place]) 04/15/2011  . Essential hypertension 04/12/2011  . Hyperlipidemia 04/12/2011  . Tobacco abuse 04/12/2011    Past Surgical History:  Procedure Laterality Date  . AV FISTULA PLACEMENT Left 08/11/2009   Dr Sherren Mocha Early  . CYSTOSCOPY  06/21/2003  . PROSTATE BIOPSY  04/01/2000  . PROSTATECTOMY    . SHUNTOGRAM N/A 02/08/2012   Procedure: Earney Mallet;  Surgeon: Serafina Mitchell, MD;  Location[facility] C[facility] LAB;  Service: Cardiovascular;  Laterality: N/A;        Home Medications    Prior to Admission medications   Medication Sig Start Date End Date Taking? Authorizing Provider  aspirin 81 MG tablet Take 81 mg by mouth daily.     [provider]  cinacalcet (SENSIPAR) 30 MG tablet Take 30 mg by mouth 3 (three) times a week.    [provider]  doxercalciferol (HECTOROL) 0.5 MCG capsule Give 2 capsules by mouth one time a day every Monday, Wednesday, and Friday (given at dialysis).    [provider]  metoprolol tartrate (LOPRESSOR) 25 MG tablet Take 1/2 tablet on Sunday, Tuesday, Thursday. Take 1/2 tablet by mouth at bedtime for blood pressure    [provider]  midodrine (PROAMATINE) 10 MG tablet Give 1 tablet by mouth one time a day every Monday, Wednesday and Friday for dialysis prophylactic prior to dialysis    [provider]  Multiple Vitamins-Minerals (CERTA-VITE SENIOR-LUTEIN PO) Take 1 tablet by mouth. Take 1 tablet (4-300-250)by mouth every day    [provider]  RENVELA 800 MG tablet Take 3 tablets by mouth 3 (three) times daily with meals. 01/09/16   [provider]    Family History Family History  Problem Relation Age of Onset  . Stroke Father   . Early death Sister        Some type of accident  . Cancer Sister        Throat  . Diabetes Sister   . Hypertension Sister   . Dementia Sister     Social History Social History   Tobacco Use  . Smoking status: Current Every Day  Smoker    Packs/day: 0.25    Years: 60.00    Pack years: 15.00    Types: Cigarettes    Last attempt to quit: 07/25/2011    Years since quitting: 5.9  . Smokeless tobacco: Never Used  . Tobacco comment: 2 cigarette every now and then  Substance Use Topics  . Alcohol use: No    Alcohol/week: 0.0 oz  . Drug use: No     Allergies   Patient has no known allergies.   Review of Systems Review of Systems   Physical Exam Updated Vital Signs BP (!) 84/51   Pulse 73   Temp (!) 96.7 F (35.9 C) (Rectal)   Resp 20   SpO2 100%   Physical Exam   ED Treatments / Results  Labs (all labs ordered are listed, but only abnormal results are displayed) Labs  Reviewed  CBC WITH DIFFERENTIAL/PLATELET - Abnormal; Notable for the following components:      Result Value   RBC 1.76 (*)    Hemoglobin 5.1 (*)    HCT 16.8 (*)    RDW 17.2 (*)    Platelets 136 (*)    Neutro Abs 9.0 (*)    All other components within normal limits  I-STAT CHEM 8, ED - Abnormal; Notable for the following components:   Potassium 5.6 (*)    BUN 124 (*)    Creatinine, Ser 8.90 (*)    Glucose, Bld 111 (*)    Calcium, Ion 1.09 (*)    Hemoglobin 6.1 (*)    HCT 18.0 (*)    All other components within normal limits  I-STAT VENOUS BLOOD GAS, ED - Abnormal; Notable for the following components:   pCO2, Ven 35.2 (*)    pO2, Ven 18.0 (*)    All other components within normal limits  I-STAT CG4 LACTIC ACID, ED - Abnormal; Notable for the following components:   Lactic Acid, Venous 5.02 (*)    All other components within normal limits  POC OCCULT BLOOD, ED - Abnormal; Notable for the following components:   Fecal Occult Bld POSITIVE (*)    All other components within normal limits  COMPREHENSIVE METABOLIC PANEL  URINALYSIS, COMPLETE (UACMP) WITH MICROSCOPIC  AMMONIA  ETHANOL  TROPONIN I  VITAMIN B12  FOLATE  IRON AND TIBC  FERRITIN  RETICULOCYTES  PROTIME-INR  I-STAT TROPONIN, ED  CBG MONITORING, ED  TYPE AND SCREEN  PREPARE RBC (CROSSMATCH)    EKG None  Radiology Dg Chest Port 1 View  Result Date: 06/20/2017 CLINICAL DATA:  Shortness of breath, hypotension, dialysis patient EXAM: PORTABLE CHEST 1 VIEW COMPARISON:  02/28/2014, 02/18/2011 FINDINGS: Mild cardiomegaly without CHF, edema or effusions. No definite focal pneumonia, collapse or consolidation. Trachea is midline. Negative for pneumothorax. Aorta is atherosclerotic. Stable paratracheal soft tissue density, suspect related to tortuous vasculature. IMPRESSION: Cardiomegaly without acute chest process.  Stable exam. Atherosclerosis Electronically Signed   By: Jerilynn Mages.  Shick M.D.   On: 06/20/2017 13:56     Procedures .Critical Care Performed by: Sherwood Gambler, MD Authorized by: Sherwood Gambler, MD   Critical care provider statement:    Critical care time (minutes):  40   Critical care time was exclusive of:  Separately billable procedures and treating other patients   Critical care was necessary to treat or prevent imminent or life-threatening deterioration of the following conditions:  Circulatory failure and shock   Critical care was time spent personally by me on the following activities:  Development of treatment plan  with patient or surrogate, discussions with consultants, evaluation of patient's response to treatment, examination of patient, obtaining history from patient or surrogate, ordering and performing treatments and interventions, ordering and review of laboratory studies, ordering and review of radiographic studies, pulse oximetry, re-evaluation of patient's condition and review of old charts   (including critical care time)  Angiocath insertion Performed by: Ephraim Hamburger  Consent: Verbal consent obtained. Risks and benefits: risks, benefits and alternatives were discussed Time out: Immediately prior to procedure a "time out" was called to verify the correct patient, procedure, equipment, support staff and site/side marked as required.  Preparation: Patient was prepped and draped in the usual sterile fashion.  Vein Location: right basilic  Ultrasound Guided  Gauge: 20  Normal blood return and flush without difficulty Patient tolerance: Patient tolerated the procedure well with no immediate complications.     Medications Ordered in ED Medications  pantoprazole (PROTONIX) injection 40 mg (has no administration in time range)  sodium chloride 0.9 % bolus 500 mL (has no administration in time range)  0.9 %  sodium chloride infusion ( Intravenous New Bag/Given 06/20/17 1457)  ondansetron (ZOFRAN) injection 4 mg (4 mg Intravenous Given 06/20/17 1457)      Initial Impression / Assessment and Plan / ED Course  I have reviewed the triage vital signs and the nursing notes.  Pertinent labs & imaging results that were available during my care of the patient were reviewed by me and considered in my medical decision making (see chart for details).     The patient's mental status and overall health has progressively declined for 2 weeks according to daughters who are now at the bedside.  They do not know of any specific GI bleeding or pain like symptoms.  He is noted to have a hemoglobin of 5 and has black stool on rectal exam.  He was given IV Protonix.  His last hemoglobin before was 12.  He will be given 2 units of packed red blood cells.  He is not on a blood thinner though he is at risk for bleeding due to his dialysis.  I had a discussion with the daughters about his plan of care.  He is a DNR and they would not want him intubated if needed.  They do not want any heroic measures but would be understanding of doing a possible endoscopy if needed.  If he does not seem to improve with blood or other treatments they would consider comfort care.  Discussed with the hospitalist, Dr. Candiss Norse who will admit.  Discussed with GI, Wallace Going who will consult.  Final Clinical Impressions(s) / ED Diagnoses   Final diagnoses:  None    ED Discharge Orders    None       Sherwood Gambler, MD 06/20/17 1705    Sherwood Gambler, MD 06/20/17 [phone]

## 2017-06-20 NOTE — ED Notes (Signed)
IV TEAM remains at bedside to deaccess patient's fistula

## 2017-06-20 NOTE — ED Triage Notes (Signed)
Pt presents via [facility] for evaluation of hypotension and altered mental status from dialysis today. Pt last treatment on Friday, none completed today. Pt denies cp.

## 2017-06-20 NOTE — H&P (Signed)
[facility] H&P   Patient Demographics:    [name], is a 84 y.o. male  MRN: [id number]   DOB - [birth year]  Admit Date - 06/20/2017  Outpatient Primary MD for the patient is Lauree Chandler, NP  Outpatient Specialists: Dr Lorrene Reid    Patient coming from: ALF  Chief Complaint  Patient presents with  . Altered Mental Status      HPI:    [name]  is a 84 y.o. male, HX of dementia, ESRD on MWF schedule under the care of Dr. Jamal Maes, documented history of BPH, prostate cancer, hypertension, dyslipidemia, Chronic Systolic CHF EF 30%, who lives in assisted living facility with lot of help by his 2 daughters and activities of daily living, per daughter's for the last 2 weeks he has been acting little more confused than usual, went to dialysis today where he was found to be hypotensive with a hemoglobin of 5 which is down from eleven 1 week ago, he was sent to the ER where hemoglobin of 5 was confirmed.  He had dark black stool which was Hemoccult positive, he also had 2 episodes of emesis in the ER.  Patient is mildly confused and is got no subjective complaints himself he does not know why he is here and he actually thinks he still in assisted living facility.  Above history has been obtained through chart review and with the daughter's assistance.  I was called to admit the patient for symptomatic anemia work-up, possible upper GI bleeding and need for dialysis.  Patient was not dialyzed today.    Review of systems:    In addition to the HPI above, A full 10 point Review of Systems was done, except as stated above, all other Review of Systems were negative, patient is pleasantly confused and is a poor historian,  he appears to be in no distress and has no subjective complaints himself.   With Past History of the following :    Past Medical History:  Diagnosis Date  . Anemia   . BPH (benign prostatic hyperplasia)   . CHF (congestive heart failure) ([place])   . Chronic kidney disease   . Dementia in conditions classified elsewhere without behavioral disturbance   . Hyperlipidemia   . Hypertension   . Prostate cancer ([place])  Past Surgical History:  Procedure Laterality Date  . AV FISTULA PLACEMENT Left 08/11/2009   Dr Sherren Mocha Early  . CYSTOSCOPY  06/21/2003  . PROSTATE BIOPSY  04/01/2000  . PROSTATECTOMY    . SHUNTOGRAM N/A 02/08/2012   Procedure: Earney Mallet;  Surgeon: Serafina Mitchell, MD;  Location: [facility] CATH LAB;  Service: Cardiovascular;  Laterality: N/A;      Social History:     Social History   Tobacco Use  . Smoking status: Current Every Day Smoker    Packs/day: 0.25    Years: 60.00    Pack years: 15.00    Types: Cigarettes    Last attempt to quit: 07/25/2011    Years since quitting: 5.9  . Smokeless tobacco: Never Used  . Tobacco comment: 2 cigarette every now and then  Substance Use Topics  . Alcohol use: No    Alcohol/week: 0.0 oz         Family History :     Family History  Problem Relation Age of Onset  . Stroke Father   . Early death Sister        Some type of accident  . Cancer Sister        Throat  . Diabetes Sister   . Hypertension Sister   . Dementia Sister        Home Medications:   Prior to Admission medications   Medication Sig Start Date End Date Taking? Authorizing Provider  aspirin 81 MG tablet Take 81 mg by mouth daily.   Yes [provider]  cinacalcet (SENSIPAR) 60 MG tablet Take 60 mg by mouth every evening.    Yes [provider]  metoprolol tartrate (LOPRESSOR) 25 MG tablet Take 12.5 mg by mouth See admin instructions. Take 1/2 tablet (12.5mg ) by mouth in the morning on Tuesday, Thursday, Saturday and Sunday. Take 1/2  tablet (12.5mg ) by mouth nightly.   Yes [provider]  midodrine (PROAMATINE) 10 MG tablet Take 10 mg by mouth See admin instructions. every Monday, Wednesday and Friday   Yes [provider]  Multiple Vitamins-Minerals (CERTA-VITE SENIOR-LUTEIN PO) Take 1 tablet by mouth daily.    Yes [provider]  RENVELA 800 MG tablet Take 3 tablets by mouth 3 (three) times daily with meals. 01/09/16  Yes [provider]  simvastatin (ZOCOR) 10 MG tablet Take 10 mg by mouth at bedtime.    [provider]     Allergies:    No Known Allergies   Physical Exam:   Vitals  Blood pressure 97/71, pulse (!) 25, temperature (!) 97.5 F (36.4 C), temperature source Oral, resp. rate 13, SpO2 91 %.   1. General thin elderly African-American male lying in hospital bed in no distress,  2. Normal affect and insight, Not Suicidal or Homicidal, Awake but pleasantly confused thinks he is still at his assisted living facility, has no idea why he was brought to the hospital.  3. No F.N deficits, ALL C.Nerves Intact, Strength 5/5 all 4 extremities, Sensation intact all 4 extremities, Plantars down going.  4. Ears and Eyes appear Normal, Conjunctivae clear, PERRLA. Moist Oral Mucosa.  5. Supple Neck, No JVD, No cervical lymphadenopathy appriciated, No Carotid Bruits.  6. Symmetrical Chest wall movement, Good air movement bilaterally, CTAB.  7. RRR, No Gallops, Rubs or Murmurs, No Parasternal Heave.  8. Positive Bowel Sounds, Abdomen Soft, No tenderness, No organomegaly appriciated,No rebound -guarding or rigidity.  9.  No Cyanosis, Normal Skin Turgor, No Skin Rash or  Bruise.  10. Good muscle tone,  joints appear normal , no effusions, Normal ROM.  11. No Palpable Lymph Nodes in Neck or Axillae      Data Review:    CBC Recent Labs  Lab 06/20/17 1332 06/20/17 1356  WBC 10.3  --   HGB 5.1* 6.1*  HCT 16.8* 18.0*  PLT 136*  --   MCV 95.5  --   MCH 29.0   --   MCHC 30.4  --   RDW 17.2*  --   LYMPHSABS 0.8  --   MONOABS 0.3  --   EOSABS 0.1  --   BASOSABS 0.0  --    ------------------------------------------------------------------------------------------------------------------  Chemistries  Recent Labs  Lab 06/20/17 1332 06/20/17 1356  NA 143 140  K 5.8* 5.6*  CL 102 101  CO2 22  --   GLUCOSE 112* 111*  BUN 133* 124*  CREATININE 8.63* 8.90*  CALCIUM 8.9  --   AST 19  --   ALT 9*  --   ALKPHOS 63  --   BILITOT 0.5  --    ------------------------------------------------------------------------------------------------------------------ CrCl cannot be calculated (Unknown ideal weight.). ------------------------------------------------------------------------------------------------------------------ No results for input(s): TSH, T4TOTAL, T3FREE, THYROIDAB in the last 72 hours.  Invalid input(s): FREET3  Coagulation profile No results for input(s): INR, PROTIME in the last 168 hours. ------------------------------------------------------------------------------------------------------------------- No results for input(s): DDIMER in the last 72 hours. -------------------------------------------------------------------------------------------------------------------  Cardiac Enzymes Recent Labs  Lab 06/20/17 1332  TROPONINI 0.05*   ------------------------------------------------------------------------------------------------------------------ No results found for: BNP   ---------------------------------------------------------------------------------------------------------------  Urinalysis    Component Value Date/Time   COLORURINE YELLOW 04/13/2011 1552   APPEARANCEUR CLEAR 04/13/2011 1552   LABSPEC 1.010 04/13/2011 1552   PHURINE 5.5 04/13/2011 1552   GLUCOSEU NEGATIVE 04/13/2011 1552   HGBUR MODERATE (A) 04/13/2011 1552   BILIRUBINUR NEGATIVE 04/13/2011 1552   KETONESUR NEGATIVE 04/13/2011 1552    PROTEINUR 30 (A) 04/13/2011 1552   UROBILINOGEN 0.2 04/13/2011 1552   NITRITE NEGATIVE 04/13/2011 1552   LEUKOCYTESUR MODERATE (A) 04/13/2011 1552    ----------------------------------------------------------------------------------------------------------------   Imaging Results:    Dg Chest Port 1 View  Result Date: 06/20/2017 CLINICAL DATA:  Shortness of breath, hypotension, dialysis patient EXAM: PORTABLE CHEST 1 VIEW COMPARISON:  02/28/2014, 02/18/2011 FINDINGS: Mild cardiomegaly without CHF, edema or effusions. No definite focal pneumonia, collapse or consolidation. Trachea is midline. Negative for pneumothorax. Aorta is atherosclerotic. Stable paratracheal soft tissue density, suspect related to tortuous vasculature. IMPRESSION: Cardiomegaly without acute chest process.  Stable exam. Atherosclerosis Electronically Signed   By: Jerilynn Mages.  Shick M.D.   On: 06/20/2017 13:56    My personal review of EKG: Rhythm NSR, with lateral T wave inversion slightly more pronounced than previous EKG from few years ago   Assessment & Plan:     1.  Symptomatic anemia causing metabolic encephalopathy in a patient with delirium due to most likely upper GI bleed caused by aspirin use.  He will be admitted to the hospital, will get 2 units of packed RBC, IV PPI infusion, clear liquid diet, check anemia panel and consult GI.  GI was informed and called by the ER physician.  Patient's family does not want aggressive measures but they are okay for EGD or colonoscopy if needed.  Will monitor H&H serially after transfusion.  2.  ESRD.  Missed dialysis today.  On MWF schedule, renal informed.  3.  Essential hypertension.  Blood pressure low hold dose beta-blocker, continue midodrine and monitor.  4.  History of tobacco abuse.  Counseled to quit.  5.  Dyslipidemia.  On statin continue.  6.  Chronic systolic CHF EF 54% on last echocardiogram.  Currently compensated no acute issues.  No beta-blocker due to low blood  pressure, no ACE/ARB due to renal failure.  7.  Underlying dementia, deconditioning.  At risk for delirium/encephalopathy due to symptomatic anemia and being in unfamiliar setting, once better PT and placement reevaluation.  Apparently they are having a hard time maintaining the patient at ALF.  8.  Acute upper GI bleed related blood loss anemia on top of anemia of chronic disease due to ESRD.  Plan as a #1 above.   DVT Prophylaxis SCDs  AM Labs Ordered, also please review Full Orders  Family Communication: Admission, patients condition and plan of care including tests being ordered have been discussed with the patient and daughters who indicate understanding and agree with the plan and Code Status.  Code Status DNR  Likely DC to  TBD  Condition GUARDED    Consults called: Renal, GI by EDP    Admission status: Inpt    Time spent in minutes : 35   Lala Lund M.D on 06/20/2017 at 5:07 PM  Between 7am to 7pm - Pager - [phone] ( page via [facility], text pages only, please mention full 10 digit call back number).  After 7pm go to [email or web address] - password [facility]  [facility] - Office  [phone]

## 2017-06-20 NOTE — ED Notes (Signed)
Cancelled Code STEMI via [place] w/ [facility] per Dr. [place]ique.

## 2017-06-21 ENCOUNTER — Other Ambulatory Visit: Payer: Self-pay

## 2017-06-21 ENCOUNTER — Inpatient Hospital Stay (HOSPITAL_COMMUNITY): Payer: Medicare Other | Admitting: Certified Registered Nurse Anesthetist

## 2017-06-21 ENCOUNTER — Encounter (HOSPITAL_COMMUNITY)
Admission: EM | Disposition: A | Discharge status: Hospice | Payer: Self-pay | Source: Home / Self Care | Attending: Family Medicine

## 2017-06-21 ENCOUNTER — Encounter (HOSPITAL_COMMUNITY): Payer: Self-pay

## 2017-06-21 DIAGNOSIS — D649 Anemia, unspecified: Secondary | ICD-10-CM

## 2017-06-21 DIAGNOSIS — K5521 Angiodysplasia of colon with hemorrhage: Secondary | ICD-10-CM

## 2017-06-21 DIAGNOSIS — K2210 Ulcer of esophagus without bleeding: Secondary | ICD-10-CM

## 2017-06-21 DIAGNOSIS — K921 Melena: Secondary | ICD-10-CM

## 2017-06-21 DIAGNOSIS — K922 Gastrointestinal hemorrhage, unspecified: Secondary | ICD-10-CM

## 2017-06-21 DIAGNOSIS — K31811 Angiodysplasia of stomach and duodenum with bleeding: Secondary | ICD-10-CM

## 2017-06-21 DIAGNOSIS — K5710 Diverticulosis of small intestine without perforation or abscess without bleeding: Secondary | ICD-10-CM

## 2017-06-21 HISTORY — PX: HOT HEMOSTASIS: SHX5433

## 2017-06-21 HISTORY — PX: ESOPHAGOGASTRODUODENOSCOPY (EGD) WITH PROPOFOL: SHX5813

## 2017-06-21 LAB — POCT I-STAT, CHEM 8
BUN: 138 mg/dL — AB (ref 6–20)
CHLORIDE: 106 mmol/L (ref 101–111)
CREATININE: 9.1 mg/dL — AB (ref 0.61–1.24)
Calcium, Ion: 0.94 mmol/L — ABNORMAL LOW (ref 1.15–1.40)
GLUCOSE: 95 mg/dL (ref 65–99)
HCT: 19 % — ABNORMAL LOW (ref 39.0–52.0)
HEMOGLOBIN: 6.5 g/dL — AB (ref 13.0–17.0)
Potassium: 5.5 mmol/L — ABNORMAL HIGH (ref 3.5–5.1)
Sodium: 142 mmol/L (ref 135–145)
TCO2: 23 mmol/L (ref 22–32)

## 2017-06-21 LAB — CBC
HCT: 27.5 % — ABNORMAL LOW (ref 39.0–52.0)
HEMATOCRIT: 20.1 % — AB (ref 39.0–52.0)
HEMOGLOBIN: 6.6 g/dL — AB (ref 13.0–17.0)
Hemoglobin: 9.4 g/dL — ABNORMAL LOW (ref 13.0–17.0)
MCH: 29.1 pg (ref 26.0–34.0)
MCH: 29.1 pg (ref 26.0–34.0)
MCHC: 32.8 g/dL (ref 30.0–36.0)
MCHC: 34.2 g/dL (ref 30.0–36.0)
MCV: 88.5 fL (ref 78.0–100.0)
MCV: 85.1 fL (ref 78.0–100.0)
Platelets: 117 10*3/uL — ABNORMAL LOW (ref 150–400)
Platelets: 104 10*3/uL — ABNORMAL LOW (ref 150–400)
RBC: 2.27 MIL/uL — ABNORMAL LOW (ref 4.22–5.81)
RBC: 3.23 MIL/uL — ABNORMAL LOW (ref 4.22–5.81)
RDW: 15.7 % — ABNORMAL HIGH (ref 11.5–15.5)
RDW: 15.6 % — AB (ref 11.5–15.5)
WBC: 10.8 10*3/uL — AB (ref 4.0–10.5)
WBC: 15.1 10*3/uL — AB (ref 4.0–10.5)

## 2017-06-21 LAB — BASIC METABOLIC PANEL
ANION GAP: 17 — AB (ref 5–15)
BUN: 152 mg/dL — ABNORMAL HIGH (ref 6–20)
CHLORIDE: 105 mmol/L (ref 101–111)
CO2: 22 mmol/L (ref 22–32)
Calcium: 8.3 mg/dL — ABNORMAL LOW (ref 8.9–10.3)
Creatinine, Ser: 9.19 mg/dL — ABNORMAL HIGH (ref 0.61–1.24)
GFR calc non Af Amer: 4 mL/min — ABNORMAL LOW (ref >60)
GFR, EST AFRICAN AMERICAN: 5 mL/min — AB (ref >60)
GLUCOSE: 105 mg/dL — AB (ref 65–99)
Potassium: 5.7 mmol/L — ABNORMAL HIGH (ref 3.5–5.1)
Sodium: 144 mmol/L (ref 135–145)

## 2017-06-21 LAB — PREPARE RBC (CROSSMATCH)

## 2017-06-21 LAB — PROTIME-INR
INR: 1.33
Prothrombin Time: 16.3 seconds — ABNORMAL HIGH (ref 11.4–15.2)

## 2017-06-21 LAB — MRSA PCR SCREENING: MRSA by PCR: POSITIVE — AB

## 2017-06-21 SURGERY — ESOPHAGOGASTRODUODENOSCOPY (EGD) WITH PROPOFOL
Anesthesia: Monitor Anesthesia Care

## 2017-06-21 MED ORDER — DIPHENHYDRAMINE HCL 50 MG/ML IJ SOLN: 25.0000 mg | Freq: Four times a day (QID) | INTRAMUSCULAR | Status: DC | PRN

## 2017-06-21 MED ORDER — SODIUM CHLORIDE 0.9 % IV SOLN: Freq: Once | INTRAVENOUS | Status: DC

## 2017-06-21 MED ORDER — CHLORHEXIDINE GLUCONATE CLOTH 2 % EX PADS
6.0000 | MEDICATED_PAD | Freq: Every day | CUTANEOUS | Status: AC
  Administered 2017-06-21 – 2017-06-25 (×3): 6 via TOPICAL

## 2017-06-21 MED ORDER — MUPIROCIN 2 % EX OINT
1.0000 "application " | TOPICAL_OINTMENT | Freq: Two times a day (BID) | CUTANEOUS | Status: AC
  Administered 2017-06-21 – 2017-06-25 (×10): 1 via NASAL
  Filled 2017-06-21: qty 22

## 2017-06-21 MED ORDER — CHLORHEXIDINE GLUCONATE CLOTH 2 % EX PADS
6.0000 | MEDICATED_PAD | Freq: Every day | CUTANEOUS | Status: DC
  Administered 2017-06-23 – 2017-06-27 (×3): 6 via TOPICAL

## 2017-06-21 MED ORDER — SODIUM CHLORIDE 0.9 % IV SOLN
INTRAVENOUS | Status: DC | PRN
  Administered 2017-06-21: 15:00:00 via INTRAVENOUS

## 2017-06-21 MED ORDER — PROPOFOL 10 MG/ML IV BOLUS
INTRAVENOUS | Status: DC | PRN
  Administered 2017-06-21: 10 mg via INTRAVENOUS

## 2017-06-21 MED ORDER — PROPOFOL 500 MG/50ML IV EMUL
INTRAVENOUS | Status: DC | PRN
  Administered 2017-06-21: 20 ug/kg/min via INTRAVENOUS

## 2017-06-21 SURGICAL SUPPLY — 15 items

## 2017-06-21 NOTE — Anesthesia Postprocedure Evaluation (Signed)
Anesthesia Post Note  Patient: [name]  Procedure(s) Performed: ESOPHAGOGASTRODUODENOSCOPY (EGD) WITH PROPOFOL (N/A ) HOT HEMOSTASIS (ARGON PLASMA COAGULATION/BICAP) (N/A )     Patient location during evaluation: Endoscopy Anesthesia Type: MAC Level of consciousness: awake Pain management: pain level controlled Vital Signs Assessment: post-procedure vital signs reviewed and stable Respiratory status: spontaneous breathing Cardiovascular status: stable Anesthetic complications: no    Last Vitals:  Vitals:   06/21/17 1544 06/21/17 1554  BP: (!) 128/28 (!) 130/32  Pulse: 79 78  Resp:  14  Temp: 36.4 C   SpO2: 100% 100%    Last Pain:  Vitals:   06/21/17 1544  TempSrc: Axillary  PainSc:                  Tannon Peerson

## 2017-06-21 NOTE — Progress Notes (Signed)
@IPLOG @        PROGRESS NOTE                                                                                                                                                                                                             Patient Demographics:    [name], is a 84 y.o. male, DOB - 1925/05/14, GMW:[id number]  Admit date - 06/20/2017   Admitting Physician Thurnell Lose, MD  Outpatient Primary MD for the patient is Dewaine Oats Carlos American, NP  LOS - 1  Chief Complaint  Patient presents with  . Altered Mental Status       Brief Narrative   [name]  is a 84 y.o. male, HX of dementia, ESRD on MWF schedule under the care of Dr. Jamal Maes, documented history of BPH, prostate cancer, hypertension, dyslipidemia, Chronic Systolic CHF EF 44%, who lives in assisted living facility with lot of help by his 2 daughters and activities of daily living, per daughter's for the last 2 weeks he has been acting little more confused than usual, went to dialysis today where he was found to be hypotensive with a hemoglobin of 5 which is down from eleven 1 week ago, he was sent to the ER where hemoglobin of 5 was confirmed.  He had dark black stool which was Hemoccult positive, he also had 2 episodes of emesis in the ER.  Patient is mildly confused and is got no subjective complaints himself he does not know why he is here and he actually thinks he still in assisted living facility.  Above history has been obtained through chart review and with the daughter's assistance.  Is admitted for upper GI bleed causing symptomatic anemia.     Subjective:    [name] today in bed appears tired, according to the daughter he did not sleep at all last night due to confusion, is delirious and able to answer questions appropriately or follow commands   Assessment  & Plan :    1.  Symptomatic anemia causing metabolic encephalopathy in a patient with delirium due to most likely upper GI bleed caused by  aspirin use.    Improved however stool still appears melanotic, continue IV PPI drip, getting his third unit of packed RBC right now and will get the fourth unit with HD later today, continue GI on board planning EGD later today.  We will continue to monitor H&H closely and provide supportive care as best as needed.  2.  ESRD.  His dialysis yesterday,  nephrology has been consulted, they will dialyze him as soon as possible.  3.  Essential hypertension.  Due to acute blood loss was hypotensive hence beta-blocker on hold, continue midodrine.  4.  History of tobacco abuse.  Counseled to quit.  5.  Dyslipidemia.  On statin continue.  6.  Chronic systolic CHF EF 37% on last echocardiogram.  Currently compensated no acute issues.  No beta-blocker due to low blood pressure, no ACE/ARB due to renal failure.  7.  Underlying dementia, deconditioning.  At risk for delirium/encephalopathy due to symptomatic anemia and being in unfamiliar setting, did get delirious last night, supportive care, use Haldol as needed, avoid benzodiazepines are narcotics.  8.  Acute upper GI bleed related blood loss anemia on top of anemia of chronic disease due to ESRD.  Plan as a #1 above.      Diet :  Diet Order           Diet NPO time specified  Diet effective now           Family Communication  : daughter  Code Status :  DNR  Disposition Plan  :  Tele then SNF likely, needs EGD and H&H stabilization before discharge.  Consults  :  GI & Renal  Procedures  :    EGD later today  3 units PRBCs so far, fourth unit during HD today.   DVT Prophylaxis  :   SCDs    Lab Results  Component Value Date   PLT 117 (L) 06/21/2017    Inpatient Medications  Scheduled Meds: . Chlorhexidine Gluconate Cloth  6 each Topical Q0600  . [START ON 06/22/2017] midodrine  10 mg Oral Q M,W,F-HD  . mupirocin ointment  1 application Nasal BID  . sevelamer carbonate  2,400 mg Oral TID WC  . simvastatin  10 mg Oral  QHS  . sodium chloride flush  3 mL Intravenous Q12H   Continuous Infusions: . sodium chloride    . sodium chloride    . pantoprozole (PROTONIX) infusion 8 mg/hr (06/21/17 0558)   PRN Meds:.diphenhydrAMINE  Antibiotics  :    Anti-infectives (From admission, onward)   None         Objective:   Vitals:   06/20/17 2149 06/21/17 0520 06/21/17 0556 06/21/17 0845  BP: (!) 109/34 (!) 102/38 (!) 110/33 (!) 109/42  Pulse: 91 85 82 77  Resp: 18 18 16 20   Temp: 97.9 F (36.6 C) 98.6 F (37 C) 98.6 F (37 C) 98.2 F (36.8 C)  TempSrc: Oral Oral Oral Oral  SpO2: 100% 95% 96% 97%  Weight: 66 kg (145 lb 6.4 oz) 67.4 kg (148 lb 9.4 oz)      Wt Readings from Last 3 Encounters:  06/21/17 67.4 kg (148 lb 9.4 oz)  05/03/17 70.3 kg (155 lb)  03/31/17 69.4 kg (153 lb)     Intake/Output Summary (Last 24 hours) at 06/21/2017 1035 Last data filed at 06/21/2017 0845 Gross per 24 hour  Intake 1802.92 ml  Output 0 ml  Net 1802.92 ml     Physical Exam  Sleeping in bed, somnolent as he did not sleep at night per family, upon waking up his delirious, No new F.N deficits,   Scott.AT,PERRAL Supple Neck,No JVD, No cervical lymphadenopathy appriciated.  Symmetrical Chest wall movement, Good air movement bilaterally, CTAB RRR,No Gallops,Rubs or new Murmurs, No Parasternal Heave +ve B.Sounds, Abd Soft, No tenderness, No organomegaly appriciated, No rebound - guarding or rigidity. No Cyanosis, Clubbing or  edema, No new Rash or bruise       Data Review:    CBC Recent Labs  Lab 06/20/17 1332 06/20/17 1356 06/21/17 0308  WBC 10.3  --  10.8*  HGB 5.1* 6.1* 6.6*  HCT 16.8* 18.0* 20.1*  PLT 136*  --  117*  MCV 95.5  --  88.5  MCH 29.0  --  29.1  MCHC 30.4  --  32.8  RDW 17.2*  --  15.7*  LYMPHSABS 0.8  --   --   MONOABS 0.3  --   --   EOSABS 0.1  --   --   BASOSABS 0.0  --   --     Chemistries  Recent Labs  Lab 06/20/17 1332 06/20/17 1356 06/21/17 0308  NA 143 140 144  K  5.8* 5.6* 5.7*  CL 102 101 105  CO2 22  --  22  GLUCOSE 112* 111* 105*  BUN 133* 124* 152*  CREATININE 8.63* 8.90* 9.19*  CALCIUM 8.9  --  8.3*  AST 19  --   --   ALT 9*  --   --   ALKPHOS 63  --   --   BILITOT 0.5  --   --    ------------------------------------------------------------------------------------------------------------------ No results for input(s): CHOL, HDL, LDLCALC, TRIG, CHOLHDL, LDLDIRECT in the last 72 hours.  Lab Results  Component Value Date   HGBA1C  11/26/2006    5.3 (NOTE)   The ADA recommends the following therapeutic goals for glycemic   control related to Hgb A1C measurement:   Goal of Therapy:   < 7.0% Hgb A1C   Action Suggested:  > 8.0% Hgb A1C   Ref:  Diabetes Care, 22, Suppl. 1, 1999   ------------------------------------------------------------------------------------------------------------------ No results for input(s): TSH, T4TOTAL, T3FREE, THYROIDAB in the last 72 hours.  Invalid input(s): FREET3 ------------------------------------------------------------------------------------------------------------------ Recent Labs    06/20/17 1444  VITAMINB12 1,252*  FOLATE 16.8  FERRITIN >7,500*  TIBC 188*  IRON 89  RETICCTPCT 2.1    Coagulation profile Recent Labs  Lab 06/20/17 2242 06/21/17 0308  INR 1.26 1.33    No results for input(s): DDIMER in the last 72 hours.  Cardiac Enzymes Recent Labs  Lab 06/20/17 1332  TROPONINI 0.05*   ------------------------------------------------------------------------------------------------------------------ No results found for: BNP  Micro Results Recent Results (from the past 240 hour(s))  MRSA PCR Screening     Status: Abnormal   Collection Time: 06/20/17 10:11 PM  Result Value Ref Range Status   MRSA by PCR POSITIVE (A) NEGATIVE Final    Comment:        The GeneXpert MRSA Assay (FDA approved for NASAL specimens only), is one component of a comprehensive MRSA  colonization surveillance program. It is not intended to diagnose MRSA infection nor to guide or monitor treatment for MRSA infections. RESULT CALLED TO, READ BACK BY AND VERIFIED WITH: A BUENDIA RN 06/21/17 0426 JDW Performed at [facility] [address], [place], [place] [address]     Radiology Reports  Ct Head Wo Contrast  Result Date: 06/20/2017 CLINICAL DATA:  Hypotension and altered mental status from dialysis today. EXAM: CT HEAD WITHOUT CONTRAST TECHNIQUE: Contiguous axial images were obtained from the base of the skull through the vertex without intravenous contrast. COMPARISON:  04/12/2011 FINDINGS: BRAIN: There is sulcal and ventricular prominence consistent with superficial and central atrophy. No intraparenchymal hemorrhage, mass effect nor midline shift. Periventricular and subcortical white matter hypodensities consistent with chronic small vessel ischemic disease are identified.  No acute large vascular territory infarcts. No abnormal extra-axial fluid collections. Basal cisterns are not effaced and midline. VASCULAR: Moderate calcific atherosclerosis of the carotid siphons. SKULL: No skull fracture. No significant scalp soft tissue swelling. SINUSES/ORBITS: The mastoid air-cells are clear. Polypoid mucous retention cyst along the floor the right maxillary sinus with mild to moderate circumferential mucosal thickening of the left maxillary sinus. Mild ethmoid sinus mucosal thickening is also noted. The frontal and sphenoid sinuses are unremarkable. The included ocular globes and orbital contents are non-suspicious. OTHER: None. IMPRESSION: Atrophy with chronic small vessel ischemic disease. No acute intracranial findings. Electronically Signed   By: Ashley Royalty M.D.   On: 06/20/2017 18:04   Dg Chest Port 1 View  Result Date: 06/20/2017 CLINICAL DATA:  Shortness of breath, hypotension, dialysis patient EXAM: PORTABLE CHEST 1 VIEW COMPARISON:  02/28/2014, 02/18/2011 FINDINGS:  Mild cardiomegaly without CHF, edema or effusions. No definite focal pneumonia, collapse or consolidation. Trachea is midline. Negative for pneumothorax. Aorta is atherosclerotic. Stable paratracheal soft tissue density, suspect related to tortuous vasculature. IMPRESSION: Cardiomegaly without acute chest process.  Stable exam. Atherosclerosis Electronically Signed   By: Jerilynn Mages.  Shick M.D.   On: 06/20/2017 13:56    Time Spent in minutes  30   Lala Lund M.D on 06/21/2017 at 10:35 AM  Between 7am to 7pm - Pager - [phone] ( page via [email or web address], text pages only, please mention full 10 digit call back number). After 7pm go to [email or web address] - password [facility]

## 2017-06-21 NOTE — Op Note (Signed)
[facility] Patient Name: [name] Procedure Date : 06/21/2017 MRN: [id number] Attending MD: Carlota Raspberry. Havery Moros , MD Date of Birth: Apr 04, 1925 CSN: [id number] Age: 84 Admit Type: Inpatient Procedure:                Upper GI endoscopy Indications:              Suspected upper gastrointestinal bleeding, ESRD on                            HD, has required multiple transfusions of PRBC Providers:                Remo Lipps P. Havery Moros, MD, Kingsley Plan, RN,                            Cherylynn Ridges, Technician, Rhae Lerner, CRNA Referring MD:              Medicines:                Monitored Anesthesia Care Complications:            No immediate complications. Estimated blood loss:                            Minimal. Estimated Blood Loss:     Estimated blood loss was minimal. Procedure:                Pre-Anesthesia Assessment:                           - Prior to the procedure, a History and Physical                            was performed, and patient medications and                            allergies were reviewed. The patient's tolerance of                            previous anesthesia was also reviewed. The risks                            and benefits of the procedure and the sedation                            options and risks were discussed with the patient.                            All questions were answered, and informed consent                            was obtained. Prior Anticoagulants: The patient has                            taken no previous anticoagulant or antiplatelet  agents. ASA Grade Assessment: III - A patient with                            severe systemic disease. After reviewing the risks                            and benefits, the patient was deemed in                            satisfactory condition to undergo the procedure.                           After obtaining informed consent, the endoscope was                            passed under direct vision. Throughout the                            procedure, the patient's blood pressure, pulse, and                            oxygen saturations were monitored continuously. The                            [id number] ([id number]) scope was introduced through the                            mouth, and advanced to the second part of duodenum.                            The upper GI endoscopy was accomplished without                            difficulty. The patient tolerated the procedure                            well. Scope In: Scope Out: Findings:      Esophagogastric landmarks were identified: the Z-line was found at 42       cm, the gastroesophageal junction was found at 42 cm and the upper       extent of the gastric folds was found at 42 cm from the incisors.      One superficial esophageal ulcer with no bleeding was found at the GEJ.       The lesion was 5 mm in largest dimension.      The exam of the esophagus was otherwise normal.      A single diminutive non bleeding angiodysplastic lesion was found in the       gastric fundus. Fulguration to ablate the lesion to prevent bleeding by       argon plasma was successful.      The exam of the stomach was otherwise normal.      Red blood was found in the entire duodenum.      Following lavage two small angiodysplastic lesions were found in the       duodenal  bulb. One of which was actively bleeding. Fulguration to ablate       the lesions and stop the bleeding by argon plasma was successful. The       lesion was observed for several minutes following ablation with no       recurrent bleeding.      A diverticulum was found in the second portion of the duodenum.      The exam of the duodenum was otherwise normal. Impression:               - Esophagogastric landmarks identified.                           - Non-bleeding esophageal ulcer.                           - Normal esophagus otherwise.                            - A single non-bleeding angiodysplastic lesion in                            the stomach. Treated with argon plasma coagulation                            (APC).                           - Blood in the entire examined duodenum.                           - Two angiodysplastic lesions in the duodenum, one                            of which was actively bleeding causing the                            patient's symptoms. Treated with argon plasma                            coagulation (APC).                           - Duodenal diverticulum. Recommendation:           - Return patient to hospital ward for ongoing care.                           - NPO for now given the patient's significant                            bleeding. Clear liquid diet tomorrow if stable                            overnight                           - Continue present medications.                           -  Continue protonix twice daily to treat esophageal                            ulceration                           - Serial Hgb, transfuse as needed                           - GI service will follow Procedure Code(s):        --- Professional ---                           [phone], Esophagogastroduodenoscopy, flexible,                            transoral; with control of bleeding, any method Diagnosis Code(s):        --- Professional ---                           K22.10, Ulcer of esophagus without bleeding                           K92.2, Gastrointestinal hemorrhage, unspecified                           K31.811, Angiodysplasia of stomach and duodenum                            with bleeding                           K57.10, Diverticulosis of small intestine without                            perforation or abscess without bleeding CPT copyright 2017 American Medical Association. All rights reserved. The codes documented in this report are preliminary and upon coder review may  be revised  to meet current compliance requirements. Remo Lipps P. Armbruster, MD 06/21/2017 3:45:17 PM This report has been signed electronically. Number of Addenda: 0

## 2017-06-21 NOTE — Progress Notes (Addendum)
[facility] Progress Note  Subjective: confused in mittens  Vitals:   06/21/17 0520 06/21/17 0556 06/21/17 0845 06/21/17 1112  BP: (!) 102/38 (!) 110/33 (!) 109/42 (!) 119/28  Pulse: 85 82 77 75  Resp: 18 16 20 12   Temp: 98.6 F (37 C) 98.6 F (37 C) 98.2 F (36.8 C)   TempSrc: Oral Oral Oral   SpO2: 95% 96% 97% 100%  Weight: 67.4 kg (148 lb 9.4 oz)   67.1 kg (148 lb)  Height:    6\' 1"  (1.854 m)    Inpatient medications: . [MAR Hold] Chlorhexidine Gluconate Cloth  6 each Topical Q0600  . [MAR Hold] midodrine  10 mg Oral Q M,W,F-HD  . [MAR Hold] mupirocin ointment  1 application Nasal BID  . [MAR Hold] sevelamer carbonate  2,400 mg Oral TID WC  . [MAR Hold] simvastatin  10 mg Oral QHS  . [MAR Hold] sodium chloride flush  3 mL Intravenous Q12H   . [MAR Hold] sodium chloride    . [MAR Hold] sodium chloride    . pantoprozole (PROTONIX) infusion 8 mg/hr (06/21/17 0558)   [MAR Hold] diphenhydrAMINE  Exam:  confused no verbal response, lethargic  no jvd  chest clear bilat   cor reg w/o mrg  abd soft ntnd no ascites  ext no edema  nf Ox 3  left arm AVF +bruit     home meds:  - midodrine 10 pre hd  - lopressor 12.5 bid non hd days and qhs on hd days  - sensipar/ ecasa/ mvi/ renvela/ statin  Dialysis: MWF [facility]  4h   68.5kg   2/2 bath  Heparin 4500  Left arm AVF - mircera 200 5/10 and 5/24  - hb 11, tsat 28% and pth 256 ferr 1272        Impression: 1  UGI bleed sp 3 prbc yest 2  Hypotension 3  esrd on HD mwf 4  Volume no excess on exam 5  Confusion - hx of memory loss / dementia per family 6   Hyperkalemia     Plan - HD today, 2u prbc's w hd, keep even   Kelly Splinter MD [facility] pager [phone]   06/21/2017, 11:18 AM   Recent Labs  Lab 06/20/17 1332 06/20/17 1356 06/21/17 0308  NA 143 140 144  K 5.8* 5.6* 5.7*  CL 102 101 105  CO2 22  --  22  GLUCOSE 112* 111* 105*  BUN 133* 124* 152*  CREATININE 8.63* 8.90*  9.19*  CALCIUM 8.9  --  8.3*   Recent Labs  Lab 06/20/17 1332  AST 19  ALT 9*  ALKPHOS 63  BILITOT 0.5  PROT 5.7*  ALBUMIN 2.7*   Recent Labs  Lab 06/20/17 1332 06/20/17 1356 06/21/17 0308  WBC 10.3  --  10.8*  NEUTROABS 9.0*  --   --   HGB 5.1* 6.1* 6.6*  HCT 16.8* 18.0* 20.1*  MCV 95.5  --  88.5  PLT 136*  --  117*   Iron/TIBC/Ferritin/ %Sat    Component Value Date/Time   IRON 89 06/20/2017 1444   TIBC 188 (L) 06/20/2017 1444   FERRITIN >7,500 (H) 06/20/2017 1444   IRONPCTSAT 47 (H) 06/20/2017 1444

## 2017-06-21 NOTE — Anesthesia Procedure Notes (Signed)
Procedure Name: MAC Date/Time: 06/21/2017 3:06 PM Performed by: Harden Mo, CRNA Pre-anesthesia Checklist: Patient identified, Emergency Drugs available, Suction available and Patient being monitored Patient Re-evaluated:Patient Re-evaluated prior to induction Oxygen Delivery Method: Nasal cannula Preoxygenation: Pre-oxygenation with 100% oxygen Induction Type: IV induction Placement Confirmation: positive ETCO2 and breath sounds checked- equal and bilateral Dental Injury: Teeth and Oropharynx as per pre-operative assessment

## 2017-06-21 NOTE — Progress Notes (Signed)
Received patient post EGD, still drowsy, daughters at bedside. PRBC ongoing from Endo.

## 2017-06-21 NOTE — Progress Notes (Signed)
CRITICAL VALUE ALERT  Critical Value:  Hgb 6.6  Date & Time Notied:  06/21/2017 0447  Provider Notified: Silas Sacramento  Orders Received/Actions taken: awaiting for orders

## 2017-06-21 NOTE — Transfer of Care (Signed)
Immediate Anesthesia Transfer of Care Note  Patient: [name]  Procedure(s) Performed: ESOPHAGOGASTRODUODENOSCOPY (EGD) WITH PROPOFOL (N/A ) HOT HEMOSTASIS (ARGON PLASMA COAGULATION/BICAP) (N/A )  Patient Location: Endoscopy Unit  Anesthesia Type:MAC  Level of Consciousness: awake  Airway & Oxygen Therapy: Patient Spontanous Breathing and Patient connected to nasal cannula oxygen  Post-op Assessment: Report given to RN, Post -op Vital signs reviewed and stable and Patient moving all extremities X 4  Post vital signs: Reviewed and stable  Last Vitals:  Vitals Value Taken Time  BP 128/28 06/21/2017  3:44 PM  Temp 36.4 C 06/21/2017  3:44 PM  Pulse 79 06/21/2017  3:44 PM  Resp    SpO2 100 % 06/21/2017  3:44 PM    Last Pain:  Vitals:   06/21/17 1544  TempSrc: Axillary  PainSc:          Complications: No apparent anesthesia complications

## 2017-06-21 NOTE — Anesthesia Preprocedure Evaluation (Addendum)
Anesthesia Evaluation  Patient identified by MRN, date of birth, ID band Patient confused    Reviewed: Allergy & Precautions, NPO status , Patient's Chart, lab work & pertinent test results  Airway Mallampati: II       Dental  (+) Edentulous Upper, Edentulous Lower, Dental Advisory Given   Pulmonary COPD, Current Smoker,    breath sounds clear to auscultation       Cardiovascular hypertension, +CHF   Rhythm:Regular Rate:Normal     Neuro/Psych    GI/Hepatic Neg liver ROS, History noted. CG   Endo/Other    Renal/GU Renal disease     Musculoskeletal   Abdominal   Peds  Hematology   Anesthesia Other Findings   Reproductive/Obstetrics                            Anesthesia Physical Anesthesia Plan  ASA: IV  Anesthesia Plan: MAC   Post-op Pain Management:    Induction: Intravenous  PONV Risk Score and Plan: Treatment may vary due to age or medical condition  Airway Management Planned: Nasal Cannula and Natural Airway  Additional Equipment:   Intra-op Plan:   Post-operative Plan:   Informed Consent: I have reviewed the patients History and Physical, chart, labs and discussed the procedure including the risks, benefits and alternatives for the proposed anesthesia with the patient or authorized representative who has indicated his/her understanding and acceptance.   Dental advisory given  Plan Discussed with: Anesthesiologist and CRNA  Anesthesia Plan Comments:         Anesthesia Quick Evaluation

## 2017-06-21 NOTE — Progress Notes (Signed)
Consultation  Referring Provider:     [facility] Primary Care Physician:  Lauree Chandler, NP Primary Gastroenterologist:        NA Reason for Consultation:     GI bleeding         HPI:   [name] is a 84 y.o. male with a history of CKD on HD, dementia, HTN, HLD, CHF, EF 35%, admitted for symptoms concerning for an upper GI bleed.  He lives in assisted living facility, daughter at bedside this morning. He was in his usual state of health and went to HD yesterday AM, found to be hypotensive with a Hgb of 5 (was 11 a few weeks prior). Daughter reports he has been more confused than usual. He was noted to pass a dark stool in the ER, an a few episodes of coffee ground emesis, unclear how long this has been going on. He is not able to provide any history. Daughter does not think he is in any pain.   He received 2 units PRBC overnight, HGb went from 5s to 6.6. He is receiving another 2 units of RBC this AM. Nursing reports multiple episodes of melena overnight. He has not had any vomiting. He has been NPO. Daughter does not think he has had any prior EGD or colonoscopy. He does not use NSAIDs.  He received kayexelate for K of 5.6 yesterday, his K is 5.7 this AM. Nephrology has seen him, HD pending stability in light of bleeding. BP and HR has been stable overnight.  Past Medical History:  Diagnosis Date  . Anemia   . BPH (benign prostatic hyperplasia)   . CHF (congestive heart failure) ([place])   . Chronic kidney disease   . Dementia in conditions classified elsewhere without behavioral disturbance   . Hyperlipidemia   . Hypertension   . Prostate cancer [facility])     Past Surgical History:  Procedure Laterality Date  . AV FISTULA PLACEMENT Left 08/11/2009   Dr Sherren Mocha Early  . CYSTOSCOPY  06/21/2003  . PROSTATE BIOPSY  04/01/2000  . PROSTATECTOMY    . SHUNTOGRAM N/A 02/08/2012   Procedure: Earney Mallet;  Surgeon: Serafina Mitchell, MD;  Location: [facility] CATH LAB;  Service:  Cardiovascular;  Laterality: N/A;    Family History  Problem Relation Age of Onset  . Stroke Father   . Early death Sister        Some type of accident  . Cancer Sister        Throat  . Diabetes Sister   . Hypertension Sister   . Dementia Sister      Social History   Tobacco Use  . Smoking status: Current Every Day Smoker    Packs/day: 0.25    Years: 60.00    Pack years: 15.00    Types: Cigarettes    Last attempt to quit: 07/25/2011    Years since quitting: 5.9  . Smokeless tobacco: Never Used  . Tobacco comment: 2 cigarette every now and then  Substance Use Topics  . Alcohol use: No    Alcohol/week: 0.0 oz  . Drug use: No    Prior to Admission medications   Medication Sig Start Date End Date Taking? Authorizing Provider  aspirin 81 MG tablet Take 81 mg by mouth daily.   Yes [provider]  cinacalcet (SENSIPAR) 60 MG tablet Take 60 mg by mouth every evening.    Yes [provider]  metoprolol tartrate (LOPRESSOR) 25 MG tablet Take 12.5  mg by mouth See admin instructions. Take 1/2 tablet (12.5mg ) by mouth in the morning on Tuesday, Thursday, Saturday and Sunday. Take 1/2 tablet (12.5mg ) by mouth nightly.   Yes [provider]  midodrine (PROAMATINE) 10 MG tablet Take 10 mg by mouth See admin instructions. every Monday, Wednesday and Friday   Yes [provider]  Multiple Vitamins-Minerals (CERTA-VITE SENIOR-LUTEIN PO) Take 1 tablet by mouth daily.    Yes [provider]  RENVELA 800 MG tablet Take 3 tablets by mouth 3 (three) times daily with meals. 01/09/16  Yes [provider]  simvastatin (ZOCOR) 10 MG tablet Take 10 mg by mouth at bedtime.    [provider]    Current Facility-Administered Medications  Medication Dose Route Frequency Provider Last Rate Last Dose  . 0.9 %  sodium chloride infusion   Intravenous Once Bodenheimer, Charles A, NP      . 0.9 %  sodium chloride infusion   Intravenous Once  Thurnell Lose, MD      . Chlorhexidine Gluconate Cloth 2 % PADS 6 each  6 each Topical Q0600 Thurnell Lose, MD   6 each at 06/21/17 0559  . diphenhydrAMINE (BENADRYL) injection 25 mg  25 mg Intravenous Q6H PRN Thurnell Lose, MD      . Derrill Memo ON 06/22/2017] midodrine (PROAMATINE) tablet 10 mg  10 mg Oral Q M,W,F-HD Thurnell Lose, MD      . mupirocin ointment (BACTROBAN) 2 % 1 application  1 application Nasal BID Thurnell Lose, MD      . pantoprazole (PROTONIX) 80 mg in sodium chloride 0.9 % 250 mL (0.32 mg/mL) infusion  8 mg/hr Intravenous Continuous Thurnell Lose, MD 25 mL/hr at 06/21/17 0558 8 mg/hr at 06/21/17 0558  . sevelamer carbonate (RENVELA) tablet 2,400 mg  2,400 mg Oral TID WC Thurnell Lose, MD   2,400 mg at 06/20/17 2245  . simvastatin (ZOCOR) tablet 10 mg  10 mg Oral QHS Thurnell Lose, MD   10 mg at 06/20/17 2246  . sodium chloride flush (NS) 0.9 % injection 3 mL  3 mL Intravenous Q12H Thurnell Lose, MD   3 mL at 06/20/17 2246    Allergies as of 06/20/2017  . (No Known Allergies)     Review of Systems:    As per HPI, otherwise negative    Physical Exam:  Vital signs in last 24 hours: Temp:  [95 F (35 C)-98.6 F (37 C)] 98.6 F (37 C) (06/11 0556) Pulse Rate:  [8-106] 82 (06/11 0556) Resp:  [13-29] 16 (06/11 0556) BP: (74-110)/(25-71) 110/33 (06/11 0556) SpO2:  [91 %-100 %] 96 % (06/11 0556) Weight:  [145 lb 6.4 oz (66 kg)-148 lb 9.4 oz (67.4 kg)] 148 lb 9.4 oz (67.4 kg) (06/11 0520)   General:   Pleasant male in NAD, not able to answer questions appropriately.  Head:  Normocephalic and atraumatic. Eyes:   No icterus.   Conjunctiva pale. Neck:  Supple Lungs:  Respirations even and unlabored. Lungs clear to auscultation bilaterally.    Heart:  Regular rate and rhythm Abdomen:  Soft, nondistended, nontender.  No appreciable masses or hepatomegaly.  Rectal:  Not performed.  Msk:  Symmetrical without gross deformities.  Extremities:   Without edema. Neurologic:  Confused, restraints on    LAB RESULTS: Recent Labs    06/20/17 1332 06/20/17 1356 06/21/17 0308  WBC 10.3  --  10.8*  HGB 5.1* 6.1* 6.6*  HCT 16.8* 18.0*  20.1*  PLT 136*  --  117*   BMET Recent Labs    06/20/17 1332 06/20/17 1356 06/21/17 0308  NA 143 140 144  K 5.8* 5.6* 5.7*  CL 102 101 105  CO2 22  --  22  GLUCOSE 112* 111* 105*  BUN 133* 124* 152*  CREATININE 8.63* 8.90* 9.19*  CALCIUM 8.9  --  8.3*   LFT Recent Labs    06/20/17 1332  PROT 5.7*  ALBUMIN 2.7*  AST 19  ALT 9*  ALKPHOS 63  BILITOT 0.5   PT/INR Recent Labs    06/20/17 2242 06/21/17 0308  LABPROT 15.7* 16.3*  INR 1.26 1.33    STUDIES: Ct Head Wo Contrast  Result Date: 06/20/2017 CLINICAL DATA:  Hypotension and altered mental status from dialysis today. EXAM: CT HEAD WITHOUT CONTRAST TECHNIQUE: Contiguous axial images were obtained from the base of the skull through the vertex without intravenous contrast. COMPARISON:  04/12/2011 FINDINGS: BRAIN: There is sulcal and ventricular prominence consistent with superficial and central atrophy. No intraparenchymal hemorrhage, mass effect nor midline shift. Periventricular and subcortical white matter hypodensities consistent with chronic small vessel ischemic disease are identified. No acute large vascular territory infarcts. No abnormal extra-axial fluid collections. Basal cisterns are not effaced and midline. VASCULAR: Moderate calcific atherosclerosis of the carotid siphons. SKULL: No skull fracture. No significant scalp soft tissue swelling. SINUSES/ORBITS: The mastoid air-cells are clear. Polypoid mucous retention cyst along the floor the right maxillary sinus with mild to moderate circumferential mucosal thickening of the left maxillary sinus. Mild ethmoid sinus mucosal thickening is also noted. The frontal and sphenoid sinuses are unremarkable. The included ocular globes and orbital contents are non-suspicious. OTHER:  None. IMPRESSION: Atrophy with chronic small vessel ischemic disease. No acute intracranial findings. Electronically Signed   By: Ashley Royalty M.D.   On: 06/20/2017 18:04   Dg Chest Port 1 View  Result Date: 06/20/2017 CLINICAL DATA:  Shortness of breath, hypotension, dialysis patient EXAM: PORTABLE CHEST 1 VIEW COMPARISON:  02/28/2014, 02/18/2011 FINDINGS: Mild cardiomegaly without CHF, edema or effusions. No definite focal pneumonia, collapse or consolidation. Trachea is midline. Negative for pneumothorax. Aorta is atherosclerotic. Stable paratracheal soft tissue density, suspect related to tortuous vasculature. IMPRESSION: Cardiomegaly without acute chest process.  Stable exam. Atherosclerosis Electronically Signed   By: Jerilynn Mages.  Shick M.D.   On: 06/20/2017 13:56      Impression / Plan:  84 y/o male with ESRD on HD and dementia, admitted for upper GI bleeding. Overnight his Hgb has not responded appropriately to 2 units PRBC, getting another 2 units. He's continuing to have melena and active bleeding. K is elevated but stable.   He warrants an EGD to further evaluate. I discussed what this would entail with his daughter and outlined risks / benefits of EGD and anesthesia. I spoke with anesthesia about his case in regards to his K, okay to proceed in that light, he has been given Kayexelate. Will give his 2 units PRBC transfusion and plan for EGD later this morning. He should remain NPO and continue IV PPI at this time. Further recommendations pending the results of EGD.    I spoke at length with the daughter about the issues at hand, he is quite ill at this time. She consents for the patient and wishes to proceed.  [place] Cellar, MD [facility]

## 2017-06-21 NOTE — H&P (View-Only) (Signed)
Consultation  Referring Provider:     [facility] Primary Care Physician:  Lauree Chandler, NP Primary Gastroenterologist:        NA Reason for Consultation:     GI bleeding         HPI:   [name] is a 84 y.o. male with a history of CKD on HD, dementia, HTN, HLD, CHF, EF 35%, admitted for symptoms concerning for an upper GI bleed.  He lives in assisted living facility, daughter at bedside this morning. He was in his usual state of health and went to HD yesterday AM, found to be hypotensive with a Hgb of 5 (was 11 a few weeks prior). Daughter reports he has been more confused than usual. He was noted to pass a dark stool in the ER, an a few episodes of coffee ground emesis, unclear how long this has been going on. He is not able to provide any history. Daughter does not think he is in any pain.   He received 2 units PRBC overnight, HGb went from 5s to 6.6. He is receiving another 2 units of RBC this AM. Nursing reports multiple episodes of melena overnight. He has not had any vomiting. He has been NPO. Daughter does not think he has had any prior EGD or colonoscopy. He does not use NSAIDs.  He received kayexelate for K of 5.6 yesterday, his K is 5.7 this AM. Nephrology has seen him, HD pending stability in light of bleeding. BP and HR has been stable overnight.  Past Medical History:  Diagnosis Date  . Anemia   . BPH (benign prostatic hyperplasia)   . CHF (congestive heart failure) ([place])   . Chronic kidney disease   . Dementia in conditions classified elsewhere without behavioral disturbance   . Hyperlipidemia   . Hypertension   . Prostate cancer [facility])     Past Surgical History:  Procedure Laterality Date  . AV FISTULA PLACEMENT Left 08/11/2009   Dr Sherren Mocha Early  . CYSTOSCOPY  06/21/2003  . PROSTATE BIOPSY  04/01/2000  . PROSTATECTOMY    . SHUNTOGRAM N/A 02/08/2012   Procedure: Earney Mallet;  Surgeon: Serafina Mitchell, MD;  Location: [facility] CATH LAB;  Service:  Cardiovascular;  Laterality: N/A;    Family History  Problem Relation Age of Onset  . Stroke Father   . Early death Sister        Some type of accident  . Cancer Sister        Throat  . Diabetes Sister   . Hypertension Sister   . Dementia Sister      Social History   Tobacco Use  . Smoking status: Current Every Day Smoker    Packs/day: 0.25    Years: 60.00    Pack years: 15.00    Types: Cigarettes    Last attempt to quit: 07/25/2011    Years since quitting: 5.9  . Smokeless tobacco: Never Used  . Tobacco comment: 2 cigarette every now and then  Substance Use Topics  . Alcohol use: No    Alcohol/week: 0.0 oz  . Drug use: No    Prior to Admission medications   Medication Sig Start Date End Date Taking? Authorizing Provider  aspirin 81 MG tablet Take 81 mg by mouth daily.   Yes [provider]  cinacalcet (SENSIPAR) 60 MG tablet Take 60 mg by mouth every evening.    Yes [provider]  metoprolol tartrate (LOPRESSOR) 25 MG tablet Take 12.5  mg by mouth See admin instructions. Take 1/2 tablet (12.5mg ) by mouth in the morning on Tuesday, Thursday, Saturday and Sunday. Take 1/2 tablet (12.5mg ) by mouth nightly.   Yes [provider]  midodrine (PROAMATINE) 10 MG tablet Take 10 mg by mouth See admin instructions. every Monday, Wednesday and Friday   Yes [provider]  Multiple Vitamins-Minerals (CERTA-VITE SENIOR-LUTEIN PO) Take 1 tablet by mouth daily.    Yes [provider]  RENVELA 800 MG tablet Take 3 tablets by mouth 3 (three) times daily with meals. 01/09/16  Yes [provider]  simvastatin (ZOCOR) 10 MG tablet Take 10 mg by mouth at bedtime.    [provider]    Current Facility-Administered Medications  Medication Dose Route Frequency Provider Last Rate Last Dose  . 0.9 %  sodium chloride infusion   Intravenous Once Bodenheimer, Charles A, NP      . 0.9 %  sodium chloride infusion   Intravenous Once  Thurnell Lose, MD      . Chlorhexidine Gluconate Cloth 2 % PADS 6 each  6 each Topical Q0600 Thurnell Lose, MD   6 each at 06/21/17 0559  . diphenhydrAMINE (BENADRYL) injection 25 mg  25 mg Intravenous Q6H PRN Thurnell Lose, MD      . Derrill Memo ON 06/22/2017] midodrine (PROAMATINE) tablet 10 mg  10 mg Oral Q M,W,F-HD Thurnell Lose, MD      . mupirocin ointment (BACTROBAN) 2 % 1 application  1 application Nasal BID Thurnell Lose, MD      . pantoprazole (PROTONIX) 80 mg in sodium chloride 0.9 % 250 mL (0.32 mg/mL) infusion  8 mg/hr Intravenous Continuous Thurnell Lose, MD 25 mL/hr at 06/21/17 0558 8 mg/hr at 06/21/17 0558  . sevelamer carbonate (RENVELA) tablet 2,400 mg  2,400 mg Oral TID WC Thurnell Lose, MD   2,400 mg at 06/20/17 2245  . simvastatin (ZOCOR) tablet 10 mg  10 mg Oral QHS Thurnell Lose, MD   10 mg at 06/20/17 2246  . sodium chloride flush (NS) 0.9 % injection 3 mL  3 mL Intravenous Q12H Thurnell Lose, MD   3 mL at 06/20/17 2246    Allergies as of 06/20/2017  . (No Known Allergies)     Review of Systems:    As per HPI, otherwise negative    Physical Exam:  Vital signs in last 24 hours: Temp:  [95 F (35 C)-98.6 F (37 C)] 98.6 F (37 C) (06/11 0556) Pulse Rate:  [8-106] 82 (06/11 0556) Resp:  [13-29] 16 (06/11 0556) BP: (74-110)/(25-71) 110/33 (06/11 0556) SpO2:  [91 %-100 %] 96 % (06/11 0556) Weight:  [145 lb 6.4 oz (66 kg)-148 lb 9.4 oz (67.4 kg)] 148 lb 9.4 oz (67.4 kg) (06/11 0520)   General:   Pleasant male in NAD, not able to answer questions appropriately.  Head:  Normocephalic and atraumatic. Eyes:   No icterus.   Conjunctiva pale. Neck:  Supple Lungs:  Respirations even and unlabored. Lungs clear to auscultation bilaterally.    Heart:  Regular rate and rhythm Abdomen:  Soft, nondistended, nontender.  No appreciable masses or hepatomegaly.  Rectal:  Not performed.  Msk:  Symmetrical without gross deformities.  Extremities:   Without edema. Neurologic:  Confused, restraints on    LAB RESULTS: Recent Labs    06/20/17 1332 06/20/17 1356 06/21/17 0308  WBC 10.3  --  10.8*  HGB 5.1* 6.1* 6.6*  HCT 16.8* 18.0*  20.1*  PLT 136*  --  117*   BMET Recent Labs    06/20/17 1332 06/20/17 1356 06/21/17 0308  NA 143 140 144  K 5.8* 5.6* 5.7*  CL 102 101 105  CO2 22  --  22  GLUCOSE 112* 111* 105*  BUN 133* 124* 152*  CREATININE 8.63* 8.90* 9.19*  CALCIUM 8.9  --  8.3*   LFT Recent Labs    06/20/17 1332  PROT 5.7*  ALBUMIN 2.7*  AST 19  ALT 9*  ALKPHOS 63  BILITOT 0.5   PT/INR Recent Labs    06/20/17 2242 06/21/17 0308  LABPROT 15.7* 16.3*  INR 1.26 1.33    STUDIES: Ct Head Wo Contrast  Result Date: 06/20/2017 CLINICAL DATA:  Hypotension and altered mental status from dialysis today. EXAM: CT HEAD WITHOUT CONTRAST TECHNIQUE: Contiguous axial images were obtained from the base of the skull through the vertex without intravenous contrast. COMPARISON:  04/12/2011 FINDINGS: BRAIN: There is sulcal and ventricular prominence consistent with superficial and central atrophy. No intraparenchymal hemorrhage, mass effect nor midline shift. Periventricular and subcortical white matter hypodensities consistent with chronic small vessel ischemic disease are identified. No acute large vascular territory infarcts. No abnormal extra-axial fluid collections. Basal cisterns are not effaced and midline. VASCULAR: Moderate calcific atherosclerosis of the carotid siphons. SKULL: No skull fracture. No significant scalp soft tissue swelling. SINUSES/ORBITS: The mastoid air-cells are clear. Polypoid mucous retention cyst along the floor the right maxillary sinus with mild to moderate circumferential mucosal thickening of the left maxillary sinus. Mild ethmoid sinus mucosal thickening is also noted. The frontal and sphenoid sinuses are unremarkable. The included ocular globes and orbital contents are non-suspicious. OTHER:  None. IMPRESSION: Atrophy with chronic small vessel ischemic disease. No acute intracranial findings. Electronically Signed   By: Ashley Royalty M.D.   On: 06/20/2017 18:04   Dg Chest Port 1 View  Result Date: 06/20/2017 CLINICAL DATA:  Shortness of breath, hypotension, dialysis patient EXAM: PORTABLE CHEST 1 VIEW COMPARISON:  02/28/2014, 02/18/2011 FINDINGS: Mild cardiomegaly without CHF, edema or effusions. No definite focal pneumonia, collapse or consolidation. Trachea is midline. Negative for pneumothorax. Aorta is atherosclerotic. Stable paratracheal soft tissue density, suspect related to tortuous vasculature. IMPRESSION: Cardiomegaly without acute chest process.  Stable exam. Atherosclerosis Electronically Signed   By: Jerilynn Mages.  Shick M.D.   On: 06/20/2017 13:56      Impression / Plan:  84 y/o male with ESRD on HD and dementia, admitted for upper GI bleeding. Overnight his Hgb has not responded appropriately to 2 units PRBC, getting another 2 units. He's continuing to have melena and active bleeding. K is elevated but stable.   He warrants an EGD to further evaluate. I discussed what this would entail with his daughter and outlined risks / benefits of EGD and anesthesia. I spoke with anesthesia about his case in regards to his K, okay to proceed in that light, he has been given Kayexelate. Will give his 2 units PRBC transfusion and plan for EGD later this morning. He should remain NPO and continue IV PPI at this time. Further recommendations pending the results of EGD.    I spoke at length with the daughter about the issues at hand, he is quite ill at this time. She consents for the patient and wishes to proceed.  [place] Cellar, MD [facility]

## 2017-06-21 NOTE — Interval H&P Note (Signed)
History and Physical Interval Note:  06/21/2017 3:08 PM  [name]  has presented today for surgery, with the diagnosis of upper gi bleed  The various methods of treatment have been discussed with the patient and family. After consideration of risks, benefits and other options for treatment, the patient has consented to  Procedure(s): ESOPHAGOGASTRODUODENOSCOPY (EGD) WITH PROPOFOL (N/A) as a surgical intervention .  The patient's history has been reviewed, patient examined, no change in status, stable for surgery.  I have reviewed the patient's chart and labs.  Questions were answered to the patient's satisfaction.     [place]

## 2017-06-22 DIAGNOSIS — D696 Thrombocytopenia, unspecified: Secondary | ICD-10-CM

## 2017-06-22 DIAGNOSIS — K5521 Angiodysplasia of colon with hemorrhage: Principal | ICD-10-CM

## 2017-06-22 DIAGNOSIS — R578 Other shock: Secondary | ICD-10-CM

## 2017-06-22 DIAGNOSIS — D62 Acute posthemorrhagic anemia: Secondary | ICD-10-CM

## 2017-06-22 DIAGNOSIS — I700 Atherosclerosis of aorta: Secondary | ICD-10-CM

## 2017-06-22 DIAGNOSIS — F0150 Vascular dementia without behavioral disturbance: Secondary | ICD-10-CM

## 2017-06-22 DIAGNOSIS — I5022 Chronic systolic (congestive) heart failure: Secondary | ICD-10-CM

## 2017-06-22 LAB — CBC
HCT: 26.6 % — ABNORMAL LOW (ref 39.0–52.0)
HEMATOCRIT: 26.2 % — AB (ref 39.0–52.0)
HEMOGLOBIN: 9 g/dL — AB (ref 13.0–17.0)
Hemoglobin: 9.3 g/dL — ABNORMAL LOW (ref 13.0–17.0)
MCH: 29.2 pg (ref 26.0–34.0)
MCH: 30.2 pg (ref 26.0–34.0)
MCHC: 33.8 g/dL (ref 30.0–36.0)
MCHC: 35.5 g/dL (ref 30.0–36.0)
MCV: 86.4 fL (ref 78.0–100.0)
MCV: 85.1 fL (ref 78.0–100.0)
Platelets: 111 10*3/uL — ABNORMAL LOW (ref 150–400)
Platelets: 115 10*3/uL — ABNORMAL LOW (ref 150–400)
RBC: 3.08 MIL/uL — AB (ref 4.22–5.81)
RBC: 3.08 MIL/uL — ABNORMAL LOW (ref 4.22–5.81)
RDW: 15.8 % — ABNORMAL HIGH (ref 11.5–15.5)
RDW: 15.9 % — AB (ref 11.5–15.5)
WBC: 13.2 10*3/uL — ABNORMAL HIGH (ref 4.0–10.5)
WBC: 15.9 10*3/uL — AB (ref 4.0–10.5)

## 2017-06-22 LAB — HEMOGLOBIN AND HEMATOCRIT, BLOOD
HEMATOCRIT: 29.2 % — AB (ref 39.0–52.0)
HEMOGLOBIN: 10 g/dL — AB (ref 13.0–17.0)

## 2017-06-22 MED ORDER — CHLORHEXIDINE GLUCONATE CLOTH 2 % EX PADS: 6.0000 | MEDICATED_PAD | Freq: Every day | CUTANEOUS | Status: DC

## 2017-06-22 NOTE — Progress Notes (Signed)
Patient's daughter was concerned about father's cough. Both times I assessed the patient I didn't observe him coughing. He appeared calm and peaceful. I assessed the patient's lungs as well as oxygen level. Will continue to monitor for patient safety and comfort.

## 2017-06-22 NOTE — Progress Notes (Signed)
[facility] Progress Note  Subjective: bleeding and nonbleeding avms seen on EGD and treated. Pt still not responsive.  Was in ALF per family in a WC but responsive. On HD x 5 years, didn't want HD in 1st place because his wife was HD patient.  Daughter says she understands if he doesn't get better will need "palliative care".   Vitals:   06/21/17 2300 06/21/17 2315 06/21/17 2356 06/22/17 0500  BP: (!) 144/64 (!) 166/77 (!) 150/64 (!) 142/69  Pulse: 93 91 92 89  Resp:  16 14 16   Temp:  97.8 F (36.6 C) 98.6 F (37 C) 98.2 F (36.8 C)  TempSrc:  Oral Oral Oral  SpO2:  94% 95% 96%  Weight:  68.2 kg (150 lb 5.7 oz)  69.1 kg (152 lb 5.4 oz)  Height:        Inpatient medications: . Chlorhexidine Gluconate Cloth  6 each Topical Q0600  . Chlorhexidine Gluconate Cloth  6 each Topical Q0600  . midodrine  10 mg Oral Q M,W,F-HD  . mupirocin ointment  1 application Nasal BID  . sevelamer carbonate  2,400 mg Oral TID WC  . simvastatin  10 mg Oral QHS  . sodium chloride flush  3 mL Intravenous Q12H   . pantoprozole (PROTONIX) infusion 8 mg/hr (06/22/17 0844)   diphenhydrAMINE  Exam:  confused no verbal response, lethargic  no jvd  chest clear bilat   cor reg w/o mrg  abd soft ntnd no ascites  ext no edema  nf Ox 3  left arm AVF +bruit     home meds:  - midodrine 10 pre hd  - lopressor 12.5 bid non hd days and qhs on hd days  - sensipar/ ecasa/ mvi/ renvela/ statin  Dialysis: MWF [facility]  4h   68.5kg   2/2 bath  Heparin 4500  Left arm AVF - mircera 200 5/10 and 5/24  - hb 11, tsat 28% and pth 256 ferr 1272        Impression: 1  UGI bleed sp 3 prbc yest 2  Hypotension 3  esrd on HD mwf 4  Volume no excess on exam 5  Confusion - hx of memory loss / dementia per family; frail at baseline, MS worse now, if doesn't improve will need transition to comfort care, will consult pall care, have d/w daughter today 6   Hyperkalemia     Plan - HD again today to get  back on schedule, pall care consult   Kelly Splinter MD [facility] pager [phone]   06/22/2017, 12:22 PM   Recent Labs  Lab 06/20/17 1332 06/20/17 1356 06/21/17 0308 06/21/17 1140  NA 143 140 144 142  K 5.8* 5.6* 5.7* 5.5*  CL 102 101 105 106  CO2 22  --  22  --   GLUCOSE 112* 111* 105* 95  BUN 133* 124* 152* 138*  CREATININE 8.63* 8.90* 9.19* 9.10*  CALCIUM 8.9  --  8.3*  --    Recent Labs  Lab 06/20/17 1332  AST 19  ALT 9*  ALKPHOS 63  BILITOT 0.5  PROT 5.7*  ALBUMIN 2.7*   Recent Labs  Lab 06/20/17 1332  06/21/17 0308  06/21/17 2014 06/22/17 0002 06/22/17 0557  WBC 10.3  --  10.8*  --  15.1*  --  13.2*  NEUTROABS 9.0*  --   --   --   --   --   --   HGB 5.1*   < > 6.6*   < >  9.4* 10.0* 9.0*  HCT 16.8*   < > 20.1*   < > 27.5* 29.2* 26.6*  MCV 95.5  --  88.5  --  85.1  --  86.4  PLT 136*  --  117*  --  104*  --  111*   < > = values in this interval not displayed.   Iron/TIBC/Ferritin/ %Sat    Component Value Date/Time   IRON 89 06/20/2017 1444   TIBC 188 (L) 06/20/2017 1444   FERRITIN >7,500 (H) 06/20/2017 1444   IRONPCTSAT 47 (H) 06/20/2017 1444

## 2017-06-22 NOTE — Progress Notes (Signed)
Daily Rounding Note  06/22/2017, 8:17 AM  LOS: 2 days   SUBJECTIVE:   Chief complaint:  dtr says he periodically is coughing, no sputum.  Remains somnolent, not at all near usual engaged, reading, watching TV self     OBJECTIVE:         Vital signs in last 24 hours:    Temp:  [97.5 F (36.4 C)-98.9 F (37.2 C)] 98.2 F (36.8 C) (06/12 0500) Pulse Rate:  [74-93] 89 (06/12 0500) Resp:  [12-21] 16 (06/12 0500) BP: (93-166)/(28-88) 142/69 (06/12 0500) SpO2:  [94 %-100 %] 96 % (06/12 0500) Weight:  [148 lb (67.1 kg)-152 lb 5.4 oz (69.1 kg)] 152 lb 5.4 oz (69.1 kg) (06/12 0500) Last BM Date: 06/21/17 Filed Weights   06/21/17 1935 06/21/17 2315 06/22/17 0500  Weight: 150 lb 12.7 oz (68.4 kg) 150 lb 5.7 oz (68.2 kg) 152 lb 5.4 oz (69.1 kg)   General: looks frail.  Sleeping but awakens easily   Heart: RRR in 80s Chest: clear, open-mouth breathing, no cough Abdomen: soft, NT, ND.  Active BS  Extremities: no CCE Neuro/Psych:  Confused, follows commands sporadically.  Mittens in place.    Intake/Output from previous day: 06/11 0701 - 06/12 0700 In: 1005 [I.V.:100; Blood:905] Out: 5 [Blood:5]  Intake/Output this shift: No intake/output data recorded.  Lab Results: Recent Labs    06/21/17 0308  06/21/17 2014 06/22/17 0002 06/22/17 0557  WBC 10.8*  --  15.1*  --  13.2*  HGB 6.6*   < > 9.4* 10.0* 9.0*  HCT 20.1*   < > 27.5* 29.2* 26.6*  PLT 117*  --  104*  --  111*   < > = values in this interval not displayed.   BMET Recent Labs    06/20/17 1332 06/20/17 1356 06/21/17 0308 06/21/17 1140  NA 143 140 144 142  K 5.8* 5.6* 5.7* 5.5*  CL 102 101 105 106  CO2 22  --  22  --   GLUCOSE 112* 111* 105* 95  BUN 133* 124* 152* 138*  CREATININE 8.63* 8.90* 9.19* 9.10*  CALCIUM 8.9  --  8.3*  --    LFT Recent Labs    06/20/17 1332  PROT 5.7*  ALBUMIN 2.7*  AST 19  ALT 9*  ALKPHOS 63  BILITOT 0.5    PT/INR Recent Labs    06/20/17 2242 06/21/17 0308  LABPROT 15.7* 16.3*  INR 1.26 1.33   Hepatitis Panel No results for input(s): HEPBSAG, HCVAB, HEPAIGM, HEPBIGM in the last 72 hours.  Studies/Results: Ct Head Wo Contrast  Result Date: 06/20/2017 CLINICAL DATA:  Hypotension and altered mental status from dialysis today. EXAM: CT HEAD WITHOUT CONTRAST TECHNIQUE: Contiguous axial images were obtained from the base of the skull through the vertex without intravenous contrast. COMPARISON:  04/12/2011 FINDINGS: BRAIN: There is sulcal and ventricular prominence consistent with superficial and central atrophy. No intraparenchymal hemorrhage, mass effect nor midline shift. Periventricular and subcortical white matter hypodensities consistent with chronic small vessel ischemic disease are identified. No acute large vascular territory infarcts. No abnormal extra-axial fluid collections. Basal cisterns are not effaced and midline. VASCULAR: Moderate calcific atherosclerosis of the carotid siphons. SKULL: No skull fracture. No significant scalp soft tissue swelling. SINUSES/ORBITS: The mastoid air-cells are clear. Polypoid mucous retention cyst along the floor the right maxillary sinus with mild to moderate circumferential mucosal thickening of the left maxillary sinus. Mild ethmoid sinus mucosal thickening is also noted.  The frontal and sphenoid sinuses are unremarkable. The included ocular globes and orbital contents are non-suspicious. OTHER: None. IMPRESSION: Atrophy with chronic small vessel ischemic disease. No acute intracranial findings. Electronically Signed   By: Ashley Royalty M.D.   On: 06/20/2017 18:04   Dg Chest Port 1 View  Result Date: 06/20/2017 CLINICAL DATA:  Shortness of breath, hypotension, dialysis patient EXAM: PORTABLE CHEST 1 VIEW COMPARISON:  02/28/2014, 02/18/2011 FINDINGS: Mild cardiomegaly without CHF, edema or effusions. No definite focal pneumonia, collapse or consolidation.  Trachea is midline. Negative for pneumothorax. Aorta is atherosclerotic. Stable paratracheal soft tissue density, suspect related to tortuous vasculature. IMPRESSION: Cardiomegaly without acute chest process.  Stable exam. Atherosclerosis Electronically Signed   By: Jerilynn Mages.  Shick M.D.   On: 06/20/2017 13:56   Scheduled Meds: . Chlorhexidine Gluconate Cloth  6 each Topical Q0600  . Chlorhexidine Gluconate Cloth  6 each Topical Q0600  . midodrine  10 mg Oral Q M,W,F-HD  . mupirocin ointment  1 application Nasal BID  . sevelamer carbonate  2,400 mg Oral TID WC  . simvastatin  10 mg Oral QHS  . sodium chloride flush  3 mL Intravenous Q12H   Continuous Infusions: . sodium chloride    . sodium chloride    . pantoprozole (PROTONIX) infusion 8 mg/hr (06/21/17 2223)   PRN Meds:.diphenhydrAMINE   ASSESMENT:   *   UGIB EGD 06/21/17: non-bleeding esophageal ulcer.  Non-bleeding gastric AVM, eradicated with APC.  Blood in duodenum.  2 duodenal AVMs, 1 actively bleeding, treated with APC.  Duodenal diverticulum.  PPI drip ends 72 hours on 06/23/17 @ 1800   *   ABL anemia.  S/p 5 U PRBCs.    *   Thrombocytopenia.    *   ESRD on HD.    *  Confusion, AMS: new this admission.  Head CT: Atrophy with chronic small vessel ischemic disease. No acute intracranial findings.  PLAN   *  SLP eval before ordering diet. If ok can begin po    Azucena Freed  06/22/2017, 8:17 AM Phone [phone]

## 2017-06-22 NOTE — Evaluation (Signed)
Clinical/Bedside Swallow Evaluation Patient Details  Name: [name] MRN: [id number] Date of Birth: 1925/07/28  Today's Date: 06/22/2017 Time: SLP Start Time (ACUTE ONLY): 59 SLP Stop Time (ACUTE ONLY): 1400 SLP Time Calculation (min) (ACUTE ONLY): 26 min  Past Medical History:  Past Medical History:  Diagnosis Date  . Anemia   . BPH (benign prostatic hyperplasia)   . CHF (congestive heart failure) ([place])   . Chronic kidney disease   . Dementia in conditions classified elsewhere without behavioral disturbance   . Hyperlipidemia   . Hypertension   . Prostate cancer [facility])    Past Surgical History:  Past Surgical History:  Procedure Laterality Date  . AV FISTULA PLACEMENT Left 08/11/2009   Dr Sherren Mocha Early  . CYSTOSCOPY  06/21/2003  . ESOPHAGOGASTRODUODENOSCOPY (EGD) WITH PROPOFOL N/A 06/21/2017   Procedure: ESOPHAGOGASTRODUODENOSCOPY (EGD) WITH PROPOFOL;  Surgeon: Yetta Flock, MD;  Location: [place];  Service: Gastroenterology;  Laterality: N/A;  . HOT HEMOSTASIS N/A 06/21/2017   Procedure: HOT HEMOSTASIS (ARGON PLASMA COAGULATION/BICAP);  Surgeon: Yetta Flock, MD;  Location: [facility] ENDOSCOPY;  Service: Gastroenterology;  Laterality: N/A;  . PROSTATE BIOPSY  04/01/2000  . PROSTATECTOMY    . SHUNTOGRAM N/A 02/08/2012   Procedure: Earney Mallet;  Surgeon: Serafina Mitchell, MD;  Location: [facility] CATH LAB;  Service: Cardiovascular;  Laterality: N/A;   HPI:  84 year old man PMH dementia, ESRD, chronic systolic CHF, resident of assisted living, presented with confusion from outpatient dialysis, found to be hypotensive, anemic with hemoglobin of 5 down from eleven 1 week prior.  Admitted for work-up of anemia.  Had significant blood loss requiring total 5 units PRBC thus far.  EGD revealed bleeding angiodysplastic lesion of the duodenum, treated 6/11.  Daughter describes pt as talkative, active reader, socializes with fellow residents at [facility].    Assessment / Plan /  Recommendation Clinical Impression  Pt lethargic; could be aroused intermittently for participation.  Daughter present.  Oral care provided, removing thick secretions from posterior oral cavity.  Congested cough noted with audible, palpable secretions at level of larynx/throat.  After oral care, pt presented with ice chips/water - demonstrated likely delays, cough after consumption of water, unable to draw liquids from a straw.   Recommend continuing NPO; allowing ice chips after oral care.  SLP will f/u next date to determine readiness for diet advancement.  Anticipate diet progression as mental status improves.  D/W daughter, RN. ` SLP Visit Diagnosis: Dysphagia, unspecified (R13.10)    Aspiration Risk  Moderate aspiration risk    Diet Recommendation   continue npo, allow ice chips after oral care  Medication Administration: Via alternative means    Other  Recommendations Oral Care Recommendations: Oral care QID;Oral care prior to ice chip/H20   Follow up Recommendations Other (comment)(tbd)      Frequency and Duration min 2x/week  1 week       Prognosis Prognosis for Safe Diet Advancement: Good      Swallow Study   General Date of Onset: 06/20/17 HPI: 84 year old man PMH dementia, ESRD, chronic systolic CHF, resident of assisted living, presented with confusion from outpatient dialysis, found to be hypotensive, anemic with hemoglobin of 5 down from eleven 1 week prior.  Admitted for work-up of anemia.  Had significant blood loss requiring total 5 units PRBC thus far.  EGD revealed bleeding angiodysplastic lesion of the duodenum, treated 6/11.  Daughter describes pt as talkative, active reader, socializes with fellow residents at [facility].  Type of Study: Bedside Swallow Evaluation  Previous Swallow Assessment: no Diet Prior to this Study: NPO Temperature Spikes Noted: No History of Recent Intubation: No Behavior/Cognition: Lethargic/Drowsy Oral Cavity Assessment: Dried  secretions Oral Care Completed by SLP: Yes Oral Cavity - Dentition: Edentulous Self-Feeding Abilities: Total assist Patient Positioning: Upright in bed Baseline Vocal Quality: Low vocal intensity Volitional Cough: Strong;Congested Volitional Swallow: Unable to elicit    Oral/Motor/Sensory Function Overall Oral Motor/Sensory Function: Within functional limits   Ice Chips Ice chips: Impaired Presentation: Spoon Pharyngeal Phase Impairments: Suspected delayed Swallow   Thin Liquid Thin Liquid: Impaired Presentation: Spoon Pharyngeal  Phase Impairments: Suspected delayed Swallow;Cough - Delayed    Nectar Thick Nectar Thick Liquid: Not tested   Honey Thick Honey Thick Liquid: Not tested   Puree Puree: Not tested   Solid   GO   Solid: Not tested        [name] 06/22/2017,2:10 PM

## 2017-06-22 NOTE — Progress Notes (Signed)
PROGRESS NOTE  [name] OAC:[id number] DOB: 1925-01-26 DOA: 06/20/2017 PCP: Lauree Chandler, NP  Brief Narrative: 84 year old man PMH dementia, ESRD, chronic systolic CHF, resident of assisted living, presented with confusion from outpatient dialysis, found to be hypotensive, anemic with hemoglobin of 5 down from eleven 1 week prior.  Admitted for work-up of anemia.  Had significant blood loss requiring total 5 units PRBC thus far.  EGD revealed bleeding angiodysplastic lesion of the duodenum, treated 6/11.  Assessment/Plan Hemorrhagic shock secondary to acute blood loss anemia/symptomatic anemia secondary to upper GI bleed from angiodysplastic lesion of the duodenum, treated with APC by EGD 6/11. --Hemodynamics stable --Continue PPI per GI --Hemoglobin appears to have stabilized, continue serial CBC, transfuse as needed --Further recommendations per GI  Thrombocytopenia secondary to acute blood loss. --Stable.  Follow CBC.  Supportive care.  ESRD with modest hyperkalemia. --Continue hemodialysis per nephrology  Chronic systolic congestive heart failure, LVEF 35% by echo 2011. --No evidence of volume overload.  Dementia with acute delirium --Continue Haldol as needed.  Aortic atherosclerosis --Resume statin on discharge   DVT prophylaxis: SCDs Code Status: DNR Family Communication: daughter [name] at bedside Disposition Plan: pending PT/OT eval    Murray Hodgkins, MD  [facility] Direct contact: [phone] --[place]ia [place]  --[email or web address]; password [email or web address]  7PM-7AM contact night coverage as above 06/22/2017, 8:42 AM  LOS: 2 days   Consultants:  GI  Nephrology  Procedures:  EGD  Nonbleeding esophageal ulcer.  Nonbleeding angiodysplastic stomach lesion treated with APC.  Blood in the entire examined duodenum.  2 angiodysplastic lesions in the duodenum, 1 of which was actively bleeding, treated with APC.  6/10 2 units PRBC.  6/11 3 units  PRBC.  Antimicrobials:    Interval history/Subjective: Feels okay, no complaints.  Cough noted overnight.  Objective: Vitals:  Vitals:   06/21/17 2356 06/22/17 0500  BP: (!) 150/64 (!) 142/69  Pulse: 92 89  Resp: 14 16  Temp: 98.6 F (37 C) 98.2 F (36.8 C)  SpO2: 95% 96%    Exam:  Constitutional:  . Appears calm and comfortable Eyes:  . pupils and irises appear normal . Normal lids   ENMT:  . grossly normal hearing  . Lips appear normal Respiratory:  . CTA bilaterally, no w/r/r.  . Respiratory effort normal. No retractions or accessory muscle use Cardiovascular:  . RRR, no m/r/g . No LE extremity edema   Abdomen:  . Soft, nontender, nondistended Skin:  . Dry scaled skin legs Psychiatric:  . Mental status . Appears confused.   I have personally reviewed the following:   Labs:  Hemoglobin 6.6, 6.5, 9.4, 10.0, 9.0  Platelets 104, 111  Imaging studies:  CT head no acute findings  Chest x-ray on admission no acute disease.  EKG on admission sinus rhythm, repol abnormality  Scheduled Meds: . Chlorhexidine Gluconate Cloth  6 each Topical Q0600  . Chlorhexidine Gluconate Cloth  6 each Topical Q0600  . midodrine  10 mg Oral Q M,W,F-HD  . mupirocin ointment  1 application Nasal BID  . sevelamer carbonate  2,400 mg Oral TID WC  . simvastatin  10 mg Oral QHS  . sodium chloride flush  3 mL Intravenous Q12H   Continuous Infusions: . pantoprozole (PROTONIX) infusion 8 mg/hr (06/21/17 2223)    Principal Problem:   AVM (arteriovenous malformation) of small bowel, acquired with hemorrhage Active Problems:   Chronic systolic congestive heart failure ([place])   ESRD on dialysis [facility])   Vascular dementia without  behavioral disturbance   Symptomatic anemia   Thrombocytopenia ([facility])   Hemorrhagic shock ([facility])   Aortic atherosclerosis ([facility])   LOS: 2 days

## 2017-06-22 NOTE — Clinical Social Work Note (Signed)
Patient is a resident at [facility]. PT and OT consulted today. CSW following for evaluation recommendations to determine disposition plan.  Dayton Scrape, [place]

## 2017-06-22 NOTE — Evaluation (Signed)
Physical Therapy Evaluation Patient Details Name: [name] MRN: [id number] DOB: 16-Dec-1925 Today's Date: 06/22/2017   History of Present Illness  84 year old man PMH dementia, ESRD, chronic systolic CHF, resident of assisted living, presented with confusion from outpatient dialysis, found to be hypotensive, anemic with hemoglobin of 5 down from eleven 1 week prior.  Admitted for work-up of anemia.  Had significant blood loss requiring total 5 units PRBC thus far.  EGD revealed bleeding angiodysplastic lesion of the duodenum, treated 6/11.  Daughter describes pt as talkative, active reader, socializes with fellow residents at [place].  Clinical Impression  Orders received for PT evaluation. Patient demonstrates deficits in functional mobility as indicated below. Will benefit from continued skilled PT to address deficits and maximize function. Will see as indicated and progress as tolerated.      Follow Up Recommendations SNF    Equipment Recommendations  (TBD)    Recommendations for Other Services (Family requesting palliative)     Precautions / Restrictions Precautions Precautions: Fall      Mobility  Bed Mobility Overal bed mobility: Needs Assistance Bed Mobility: Rolling;Sidelying to Sit;Sit to Sidelying Rolling: Max assist Sidelying to sit: Max assist     Sit to sidelying: Max assist General bed mobility comments: Max assist for all aspects of mobility, patient was able to follow some commands to initiate movement but unable to carry out completion without max assist  Transfers                 General transfer comment: unable to attempt  Ambulation/Gait             General Gait Details: unable to attempt  Stairs            Wheelchair Mobility    Modified Rankin (Stroke Patients Only)       Balance Overall balance assessment: Needs assistance Sitting-balance support: Feet supported Sitting balance-Leahy Scale: Poor Sitting balance -  Comments: reliance on support >75% on the time at EOB Postural control: Right lateral lean                                   Pertinent Vitals/Pain Pain Assessment: No/denies pain    Home Living Family/patient expects to be discharged to:: Unsure                      Prior Function Level of Independence: Needs assistance   Gait / Transfers Assistance Needed: prior to decline patient could trnasfer in/out of w/c, recently has required physical assist. Patient does ambulate approx 10 ft with daughter assisting prior to decline  ADL's / Homemaking Assistance Needed: daughter assists with bathing and dressing. Patient could feed himself and do some portions of dressing/adls        Hand Dominance        Extremity/Trunk Assessment   Upper Extremity Assessment Upper Extremity Assessment: Generalized weakness;Difficult to assess due to impaired cognition    Lower Extremity Assessment Lower Extremity Assessment: Generalized weakness;Difficult to assess due to impaired cognition    Cervical / Trunk Assessment Cervical / Trunk Assessment: Kyphotic  Communication      Cognition Arousal/Alertness: Lethargic Behavior During Therapy: Flat affect Overall Cognitive Status: Difficult to assess Area of Impairment: Orientation;Attention;Memory;Following commands;Safety/judgement;Awareness;Problem solving                 Orientation Level: Disoriented to;Place;Time;Situation Current Attention Level: Focused   Following Commands: Follows  one step commands inconsistently   Awareness: Intellectual          General Comments General comments (skin integrity, edema, etc.): hygiene and pericare performed    Exercises     Assessment/Plan    PT Assessment Patient needs continued PT services  PT Problem List Decreased strength;Decreased range of motion;Decreased balance;Decreased mobility;Decreased coordination;Decreased cognition;Decreased activity  tolerance       PT Treatment Interventions DME instruction;Gait training;Functional mobility training;Therapeutic activities;Therapeutic exercise;Balance training;Neuromuscular re-education;Cognitive remediation;Patient/family education    PT Goals (Current goals can be found in the Care Plan section)  Acute Rehab PT Goals PT Goal Formulation: With family Time For Goal Achievement: 07/06/17 Potential to Achieve Goals: Fair    Frequency Min 2X/week   Barriers to discharge        Co-evaluation               AM-PAC PT "6 Clicks" Daily Activity  Outcome Measure Difficulty turning over in bed (including adjusting bedclothes, sheets and blankets)?: Unable Difficulty moving from lying on back to sitting on the side of the bed? : Unable Difficulty sitting down on and standing up from a chair with arms (e.g., wheelchair, bedside commode, etc,.)?: Unable Help needed moving to and from a bed to chair (including a wheelchair)?: Total Help needed walking in hospital room?: Total Help needed climbing 3-5 steps with a railing? : Total 6 Click Score: 6    End of Session   Activity Tolerance: Patient limited by fatigue;Patient limited by lethargy Patient left: in bed;with call bell/phone within reach;with bed alarm set;with family/visitor present;with SCD's reapplied Nurse Communication: Mobility status PT Visit Diagnosis: Other symptoms and signs involving the nervous system (R29.898);Muscle weakness (generalized) (M62.81);Adult, failure to thrive (R62.7)    Time: 4503-8882 PT Time Calculation (min) (ACUTE ONLY): 34 min   Charges:   PT Evaluation $PT Eval Moderate Complexity: 1 Mod PT Treatments $Therapeutic Activity: 8-22 mins   PT G Codes:        Alben Deeds, PT DPT  Board Certified Neurologic Specialist [phone]   Duncan Dull 06/22/2017, 3:48 PM

## 2017-06-23 DIAGNOSIS — N186 End stage renal disease: Secondary | ICD-10-CM

## 2017-06-23 DIAGNOSIS — Z515 Encounter for palliative care: Secondary | ICD-10-CM

## 2017-06-23 DIAGNOSIS — Z992 Dependence on renal dialysis: Secondary | ICD-10-CM

## 2017-06-23 DIAGNOSIS — Z7189 Other specified counseling: Secondary | ICD-10-CM

## 2017-06-23 DIAGNOSIS — K922 Gastrointestinal hemorrhage, unspecified: Secondary | ICD-10-CM

## 2017-06-23 LAB — CBC
HEMATOCRIT: 26.5 % — AB (ref 39.0–52.0)
HEMOGLOBIN: 8.7 g/dL — AB (ref 13.0–17.0)
MCH: 29.2 pg (ref 26.0–34.0)
MCHC: 32.8 g/dL (ref 30.0–36.0)
MCV: 88.9 fL (ref 78.0–100.0)
Platelets: 127 10*3/uL — ABNORMAL LOW (ref 150–400)
RBC: 2.98 MIL/uL — AB (ref 4.22–5.81)
RDW: 15.9 % — AB (ref 11.5–15.5)
WBC: 13.1 10*3/uL — AB (ref 4.0–10.5)

## 2017-06-23 LAB — BASIC METABOLIC PANEL
ANION GAP: 13 (ref 5–15)
BUN: 88 mg/dL — ABNORMAL HIGH (ref 6–20)
CHLORIDE: 106 mmol/L (ref 101–111)
CO2: 24 mmol/L (ref 22–32)
Calcium: 7.5 mg/dL — ABNORMAL LOW (ref 8.9–10.3)
Creatinine, Ser: 7.37 mg/dL — ABNORMAL HIGH (ref 0.61–1.24)
GFR calc non Af Amer: 6 mL/min — ABNORMAL LOW (ref >60)
GFR, EST AFRICAN AMERICAN: 7 mL/min — AB (ref >60)
Glucose, Bld: 79 mg/dL (ref 65–99)
POTASSIUM: 3.7 mmol/L (ref 3.5–5.1)
Sodium: 143 mmol/L (ref 135–145)

## 2017-06-23 MED ORDER — SODIUM CHLORIDE 0.9 % IV SOLN: 100.0000 mL | INTRAVENOUS | Status: DC | PRN

## 2017-06-23 MED ORDER — CHLORHEXIDINE GLUCONATE CLOTH 2 % EX PADS: 6.0000 | MEDICATED_PAD | Freq: Every day | CUTANEOUS | Status: DC

## 2017-06-23 MED ORDER — PENTAFLUOROPROP-TETRAFLUOROETH EX AERO: 1.0000 "application " | INHALATION_SPRAY | CUTANEOUS | Status: DC | PRN

## 2017-06-23 MED ORDER — LIDOCAINE-PRILOCAINE 2.5-2.5 % EX CREA: 1.0000 "application " | TOPICAL_CREAM | CUTANEOUS | Status: DC | PRN

## 2017-06-23 MED ORDER — SODIUM CHLORIDE 0.9 % IV SOLN
INTRAVENOUS | Status: DC
  Administered 2017-06-23: 12:00:00 via INTRAVENOUS

## 2017-06-23 NOTE — Evaluation (Signed)
Occupational Therapy Evaluation Patient Details Name: [name] MRN: [id number] DOB: 09/28/1925 Today's Date: 06/23/2017    History of Present Illness 84 year old man PMH dementia, ESRD, chronic systolic CHF, resident of assisted living, presented with confusion from outpatient dialysis, found to be hypotensive, anemic with hemoglobin of 5 down from eleven 1 week prior.  Admitted for work-up of anemia.  Had significant blood loss requiring total 5 units PRBC thus far.  EGD revealed bleeding angiodysplastic lesion of the duodenum, treated 6/11.  Daughter describes pt as talkative, active reader, socializes with fellow residents at [facility].   Clinical Impression   This 84 yo male admitted with above presents to acute OT with decreased mobility, lethargy, generalized weakness all affecting his safety and independence with basic ADLs. He will benefit from acute OT with follow up OT at SNF to work towards increasing ability to A with basic ADLs.     Follow Up Recommendations  SNF;Supervision/Assistance - 24 hour          Precautions / Restrictions Precautions Precautions: Fall Restrictions Weight Bearing Restrictions: No      Mobility Bed Mobility Overal bed mobility: Needs Assistance Bed Mobility: Supine to Sit     Supine to sit: Mod assist     General bed mobility comments: Pt able to A with moving legs to EOB one at a time with cues and to bring trunk up once shifted around to EOB with use of pad  Transfers Overall transfer level: Needs assistance   Transfers: Sit to/from Stand Sit to Stand: +2 physical assistance;Mod assist;From elevated surface         General transfer comment: using back of recliner in front of him with his arms on top of recliner--pt was able to achieve almost fully upright with increased time and then intermittent cues to straighten knees as he would start to sag down    Balance Overall balance assessment: Needs assistance Sitting-balance support:  Feet supported;Single extremity supported Sitting balance-Leahy Scale: Poor Sitting balance - Comments: Was able to maintain balance at EOB once there with only single UE support   Standing balance support: Bilateral upper extremity supported Standing balance-Leahy Scale: Zero Standing balance comment: Both arms supported on back of recliner and additional A from therapist                           ADL either performed or assessed with clinical judgement   ADL Overall ADL's : Needs assistance/impaired Eating/Feeding: Total assistance   Grooming: Moderate assistance;Sitting   Upper Body Bathing: Maximal assistance;Sitting   Lower Body Bathing: Total assistance Lower Body Bathing Details (indicate cue type and reason): +2 Mod A sit<>stand Upper Body Dressing : Total assistance;Sitting   Lower Body Dressing: Total assistance Lower Body Dressing Details (indicate cue type and reason): +2 Mod A sit<>stand Toilet Transfer: Maximal assistance;+2 for physical assistance;Squat-pivot Toilet Transfer Details (indicate cue type and reason): med>recliner going to pt's right Toileting- Clothing Manipulation and Hygiene: Total assistance Toileting - Clothing Manipulation Details (indicate cue type and reason): +2 Mod A sit<>stand             Vision Patient Visual Report: No change from baseline              Pertinent Vitals/Pain Pain Assessment: Faces Faces Pain Scale: No hurt Pain Intervention(s): Monitored during session     Hand Dominance Right   Extremity/Trunk Assessment Upper Extremity Assessment Upper Extremity Assessment: Generalized weakness  Communication Communication Communication: No difficulties   Cognition Arousal/Alertness: Lethargic(will keep eyes open if you are interacting with him, but closes eyes when not) Behavior During Therapy: Flat affect Overall Cognitive Status: (history of cognitive impairments, not sure if current is  baseline or worse) Area of Impairment: Following commands;Safety/judgement;Problem solving                       Following Commands: Follows one step commands consistently Safety/Judgement: Decreased awareness of safety;Decreased awareness of deficits   Problem Solving: Slow processing;Decreased initiation;Difficulty sequencing;Requires verbal cues;Requires tactile cues                Home Living Family/patient expects to be discharged to:: Assisted living                             Home Equipment: Kasandra Knudsen - single point          Prior Functioning/Environment Level of Independence: Needs assistance  Gait / Transfers Assistance Needed: prior to decline patient could transfer in/out of w/c, recently has required physical assist. Patient does ambulate approx 10 ft with daughter assisting prior to decline using SPC ADL's / Homemaking Assistance Needed: daughter assists with bathing and dressing. Patient could feed himself and do some portions of dressing/adls prior to decline            OT Problem List: Decreased strength;Impaired balance (sitting and/or standing);Decreased cognition;Decreased knowledge of use of DME or AE      OT Treatment/Interventions: Self-care/ADL training;Balance training;DME and/or AE instruction;Patient/family education    OT Goals(Current goals can be found in the care plan section) Acute Rehab OT Goals Patient Stated Goal: agreeable to get out of bed OT Goal Formulation: With patient Time For Goal Achievement: 22-Jul-2017 Potential to Achieve Goals: Fair  OT Frequency: Min 2X/week              AM-PAC PT "6 Clicks" Daily Activity     Outcome Measure Help from another person eating meals?: Total Help from another person taking care of personal grooming?: A Lot Help from another person toileting, which includes using toliet, bedpan, or urinal?: Total Help from another person bathing (including washing, rinsing, drying)?: A  Lot Help from another person to put on and taking off regular upper body clothing?: A Lot Help from another person to put on and taking off regular lower body clothing?: Total 6 Click Score: 9   End of Session Equipment Utilized During Treatment: Gait belt Nurse Communication: Mobility status(+2 bair/sara stedy; stool was black)  Activity Tolerance: Patient tolerated treatment well Patient left: in chair;with call bell/phone within reach;with chair alarm set  OT Visit Diagnosis: Unsteadiness on feet (R26.81);Other abnormalities of gait and mobility (R26.89);Muscle weakness (generalized) (M62.81)                Time: 2952-8413 OT Time Calculation (min): 27 min Charges:  OT General Charges $OT Visit: 1 Visit OT Evaluation $OT Eval Moderate Complexity: 1 Mod OT Treatments $Self Care/Home Management : 8-22 mins Golden Circle, OTR/L [phone] 06/23/2017

## 2017-06-23 NOTE — Consult Note (Addendum)
Consultation Note Date: 06/23/2017   Patient Name: [name]  DOB: [birth year]  MRN: [id number]  Age / Sex: 84 y.o., male  PCP: Lauree Chandler, NP Referring Physician: Samuella Cota, MD  Reason for Consultation: Establishing goals of care  HPI/Patient Profile: Rodrigus Rich  is a 84 y.o. male, HX of dementia, ESRD on MWF schedule. Over the past few weeks he has been acting differently, and more confused. He went to dialysis  where he was found to be hypotensive with a hemoglobin of 5 which is down from eleven 1 week ago, he was sent to the ER where hemoglobin of 5 was confirmed. EGD was performed with AVM cauterized. Continuing dialysis. Patient mental status is not improving.    Clinical Assessment and Goals of Care: Patient is resting in bed.He is unable to participate in conversation. Mitten restraints in place.  Daughter [name] at bedside. 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 He worked in [place]. He is also a retired Artist. He has 3 daughters, but [name] and [name] live in the area. [name] is the primary person that helps with care and daily needs. Hilda Blades lives in [place].   [name] states her father is widowed, and has lived in an ALF for 5 years. Prior to that he had private pay aides that came into the home, all of which he fired. She states prior to the past 3 weeks he felt fine. He was chatty, and loved to read the News Paper and watch [place]. He uses a wheelchair most of the time, but has a cane for short distances.   Three weeks ago, he began to decline. She sates he was more droopy and did not have things in order. He would not help himself bathe or dress himself, and was moving in slow motion where things that used to take only a few minutes took much longer.   She states his appetite declined and prior to coming to the hospital  just wanted to sleep.   We discussed his diagnoses, prognosis, GOC, EOL wishes disposition and options. We discussed his quality of life. NPO status.   The daughters have spoken at length about "what if's". She states he is only on dialysis because of her. She made the decision when he was not able to. He never wanted dialysis because his late wife was on it, and he believed it killed her. She states once he started the treatments, he never asked to stop them.   She confirms his DNR status. She states he would never want to be placed on a ventilator. He would never want a feeding tube. Nephrology in to speak with daughter, and plans for 2 more sessions of dialysis. If he does not improve, plans for transition to comfort care and hospice.     SUMMARY OF RECOMMENDATIONS    Plans for 2 more sessions of dialysis and further planning following these sessions.   Plans for family meeting Monday at 9:00  as all of the daughters will be present for Father's Day. Family meeting may be sooner based on dialysis schedule.   Code Status/Advance Care Planning:  DNR    Symptom Management:   Per primary team.  Palliative Prophylaxis:   Eye Care and Oral Care   Prognosis:   Unable to determine  Discharge Planning: To Be Determined      Primary Diagnoses: Present on Admission: . Chronic systolic congestive heart failure ( [place]) . Vascular dementia without behavioral disturbance . (Resolved) UGI bleed . Symptomatic anemia   I have reviewed the medical record, interviewed the patient and family, and examined the patient. The following aspects are pertinent.  Past Medical History:  Diagnosis Date  . Anemia   . BPH (benign prostatic hyperplasia)   . CHF (congestive heart failure) ([place])   . Chronic kidney disease   . Dementia in conditions classified elsewhere without behavioral disturbance   . Hyperlipidemia   . Hypertension   . Prostate cancer [facility])    Social History    Socioeconomic History  . Marital status: Widowed    Spouse name: Not on file  . Number of children: Not on file  . Years of education: Not on file  . Highest education level: Not on file  Occupational History  . Not on file  Social Needs  . Financial resource strain: Not hard at all  . Food insecurity:    Worry: Never true    Inability: Never true  . Transportation needs:    Medical: No    Non-medical: No  Tobacco Use  . Smoking status: Current Every Day Smoker    Packs/day: 0.25    Years: 60.00    Pack years: 15.00    Types: Cigarettes    Last attempt to quit: 07/25/2011    Years since quitting: 5.9  . Smokeless tobacco: Never Used  . Tobacco comment: 2 cigarette every now and then  Substance and Sexual Activity  . Alcohol use: No    Alcohol/week: 0.0 oz  . Drug use: No  . Sexual activity: Never  Lifestyle  . Physical activity:    Days per week: 0 days    Minutes per session: 0 min  . Stress: Only a little  Relationships  . Social connections:    Talks on phone: More than three times a week    Gets together: More than three times a week    Attends religious service: Never    Active member of club or organization: No    Attends meetings of clubs or organizations: Never    Relationship status: Widowed  Other Topics Concern  . Not on file  Social History Narrative  . Not on file   Family History  Problem Relation Age of Onset  . Stroke Father   . Early death Sister        Some type of accident  . Cancer Sister        Throat  . Diabetes Sister   . Hypertension Sister   . Dementia Sister    Scheduled Meds: . Chlorhexidine Gluconate Cloth  6 each Topical Q0600  . Chlorhexidine Gluconate Cloth  6 each Topical Q0600  . midodrine  10 mg Oral Q M,W,F-HD  . mupirocin ointment  1 application Nasal BID  . sevelamer carbonate  2,400 mg Oral TID WC  . simvastatin  10 mg Oral QHS  . sodium chloride flush  3 mL Intravenous Q12H   Continuous Infusions: . sodium  chloride    .  pantoprozole (PROTONIX) infusion 8 mg/hr (06/23/17 0700)   PRN Meds:.sodium chloride, diphenhydrAMINE, lidocaine-prilocaine, pentafluoroprop-tetrafluoroeth Medications Prior to Admission:  Prior to Admission medications   Medication Sig Start Date End Date Taking? Authorizing Provider  aspirin 81 MG tablet Take 81 mg by mouth daily.   Yes [provider]  cinacalcet (SENSIPAR) 60 MG tablet Take 60 mg by mouth every evening.    Yes [provider]  metoprolol tartrate (LOPRESSOR) 25 MG tablet Take 12.5 mg by mouth See admin instructions. Take 1/2 tablet (12.5mg ) by mouth in the morning on Tuesday, Thursday, Saturday and Sunday. Take 1/2 tablet (12.5mg ) by mouth nightly.   Yes [provider]  midodrine (PROAMATINE) 10 MG tablet Take 10 mg by mouth See admin instructions. every Monday, Wednesday and Friday   Yes [provider]  Multiple Vitamins-Minerals (CERTA-VITE SENIOR-LUTEIN PO) Take 1 tablet by mouth daily.    Yes [provider]  RENVELA 800 MG tablet Take 3 tablets by mouth 3 (three) times daily with meals. 01/09/16  Yes [provider]  simvastatin (ZOCOR) 10 MG tablet Take 10 mg by mouth at bedtime.    [provider]   No Known Allergies Review of Systems  Unable to perform ROS   Physical Exam  Constitutional: No distress.  Pulmonary/Chest: Effort normal.  Neurological:  Resting with eyes closed. Does not speak to this Probation officer. Spoke with another provider in short responses.   Skin: Skin is warm and dry.    Vital Signs: BP (!) 157/67 (BP Location: Right Arm)   Pulse 84   Temp 98.4 F (36.9 C) (Oral)   Resp 18   Ht 6\' 1"  (1.854 m)   Wt 69.4 kg (153 lb)   SpO2 90%   BMI 20.19 kg/m  Pain Scale: PAINAD   Pain Score: 0-No pain   SpO2: SpO2: 90 % O2 Device:SpO2: 90 % O2 Flow Rate: .   IO: Intake/output summary:   Intake/Output Summary (Last 24 hours) at 06/23/2017 1029 Last data filed at  06/23/2017 0513 Gross per 24 hour  Intake 80 ml  Output 400 ml  Net -320 ml    LBM: Last BM Date: 06/21/17 Baseline Weight: Weight: 66 kg (145 lb 6.4 oz) Most recent weight: Weight: 69.4 kg (153 lb)     Palliative Assessment/Data: NPO, mittens     Time In: 9:40 Time Out: 10:50 Time Total: 70 min Greater than 50%  of this time was spent counseling and coordinating care related to the above assessment and plan.  Signed by: Asencion Gowda, NP   Please contact Palliative Medicine Team phone at [phone] for questions and concerns.  For individual provider: See Shea Evans

## 2017-06-23 NOTE — Progress Notes (Signed)
[facility] Progress Note  Subjective: sleeping  Vitals:   06/22/17 0500 06/22/17 1437 06/23/17 0509 06/23/17 0840  BP: (!) 142/69 (!) 117/46 (!) 153/68 (!) 157/67  Pulse: 89 85 87 84  Resp: 16 18 18 18   Temp: 98.2 F (36.8 C) 98 F (36.7 C) 98.2 F (36.8 C) 98.4 F (36.9 C)  TempSrc: Oral Oral Oral Oral  SpO2: 96% 96% 95% 90%  Weight: 69.1 kg (152 lb 5.4 oz)  69.4 kg (153 lb)   Height:        Inpatient medications: . Chlorhexidine Gluconate Cloth  6 each Topical Q0600  . Chlorhexidine Gluconate Cloth  6 each Topical Q0600  . midodrine  10 mg Oral Q M,W,F-HD  . mupirocin ointment  1 application Nasal BID  . sevelamer carbonate  2,400 mg Oral TID WC  . simvastatin  10 mg Oral QHS  . sodium chloride flush  3 mL Intravenous Q12H   . sodium chloride    . sodium chloride    . pantoprozole (PROTONIX) infusion 8 mg/hr (06/23/17 0700)   sodium chloride, diphenhydrAMINE, lidocaine-prilocaine, pentafluoroprop-tetrafluoroeth  Exam:  confused no verbal response, lethargic  no jvd  chest clear bilat   cor reg w/o mrg  abd soft ntnd no ascites  ext no edema  nf Ox 3  left arm AVF +bruit     home meds:  - midodrine 10 pre hd  - lopressor 12.5 bid non hd days and qhs on hd days  - sensipar/ ecasa/ mvi/ renvela/ statin  Dialysis: MWF [facility]  4h   68.5kg   2/2 bath  Heparin 4500  Left arm AVF - mircera 200 5/10 and 5/24  - hb 11, tsat 28% and pth 256 ferr 1272        Impression: 1  UGI bleed - sp 3 prbc, EGD on 6/11 showed avm's treated w APC 2  Hypotension -resolved 3  AMS - not waking up, very lethargic still 4  Esrd on HD mwf 5  Volume no excess on exam, up 1kg over dry 6  Hyperkalemia     Plan - HD today of schedule and again tomorrow. If MS not better after this will plan transition to comfort care.    Kelly Splinter MD [facility] pager [phone]   06/23/2017, 11:24 AM   Recent Labs  Lab 06/20/17 1332 06/20/17 1356  06/21/17 0308 06/21/17 1140  NA 143 140 144 142  K 5.8* 5.6* 5.7* 5.5*  CL 102 101 105 106  CO2 22  --  22  --   GLUCOSE 112* 111* 105* 95  BUN 133* 124* 152* 138*  CREATININE 8.63* 8.90* 9.19* 9.10*  CALCIUM 8.9  --  8.3*  --    Recent Labs  Lab 06/20/17 1332  AST 19  ALT 9*  ALKPHOS 63  BILITOT 0.5  PROT 5.7*  ALBUMIN 2.7*   Recent Labs  Lab 06/20/17 1332  06/22/17 0557 06/22/17 1542 06/23/17 0711  WBC 10.3   < > 13.2* 15.9* 13.1*  NEUTROABS 9.0*  --   --   --   --   HGB 5.1*   < > 9.0* 9.3* 8.7*  HCT 16.8*   < > 26.6* 26.2* 26.5*  MCV 95.5   < > 86.4 85.1 88.9  PLT 136*   < > 111* 115* 127*   < > = values in this interval not displayed.   Iron/TIBC/Ferritin/ %Sat    Component Value Date/Time   IRON  89 06/20/2017 1444   TIBC 188 (L) 06/20/2017 1444   FERRITIN >7,500 (H) 06/20/2017 1444   IRONPCTSAT 47 (H) 06/20/2017 1444

## 2017-06-23 NOTE — Progress Notes (Addendum)
Nutrition Brief Note  Chart reviewed. Pt unresponsive. Per SLP, recommend NPO due to inability to take oral diet related to AMS. Pt family awaiting on palliative care consult and will likely transition to comfort care. Family meeting scheduled for Monday, 06/27/17. No further nutrition interventions warranted at this time.  Please re-consult as needed.   Leland Staszewski A. Jimmye Norman, RD, LDN, CDE Pager: [phone] After hours Pager: [phone]

## 2017-06-23 NOTE — Progress Notes (Signed)
PROGRESS NOTE  [name] XTG:[id number] DOB: 24-Jan-1925 DOA: 06/20/2017 PCP: Lauree Chandler, NP  Brief Narrative: 84 year old man PMH dementia, ESRD, chronic systolic CHF, resident of assisted living, presented with confusion from outpatient dialysis, found to be hypotensive, anemic with hemoglobin of 5 down from eleven 1 week prior.  Admitted for work-up of anemia.  Had significant blood loss requiring total 5 units PRBC thus far.  EGD revealed bleeding angiodysplastic lesion of the duodenum, treated 6/11.  Assessment/Plan Hemorrhagic shock secondary to acute blood loss anemia/symptomatic anemia secondary to upper GI bleed from angiodysplastic lesion of the duodenum, treated with APC by EGD 6/11. --Hemodynamics remained stable.  Continue PPI per GI. --Hemoglobin remains stable.  Thrombocytopenia secondary to acute blood loss. --Trending upwards.  No further evaluation suggested.  ESRD with modest hyperkalemia. --Continue hemodialysis per nephrology  Chronic systolic congestive heart failure, LVEF 35% by echo 2011. --No evidence of volume overload.  Dementia with acute delirium --Continue Haldol as needed.  Aortic atherosclerosis --Resume statin on discharge   Appears stable but not currently able to take any nutrition.  Discussed with daughter and palliative medicine team in room.  Plan to pursue hemodialysis for 2 further treatments and assess response.  If the patient improves, may need transfer to skilled nursing facility.  If the patient fails to improve, is unable to participate with therapy and unable to take a safe diet, will likely transition to comfort care.  No PEG tube.  DVT prophylaxis: SCDs Code Status: DNR Family Communication: daughter [name] at bedside Disposition Plan: pending PT/OT eval    Murray Hodgkins, MD  [facility] Direct contact: [phone] --[place] [place]  --[email or web address]; password [email or web address]  7PM-7AM contact night coverage as  above 06/23/2017, 7:51 AM  LOS: 3 days   Consultants:  GI  Nephrology  Procedures:  EGD  Nonbleeding esophageal ulcer.  Nonbleeding angiodysplastic stomach lesion treated with APC.  Blood in the entire examined duodenum.  2 angiodysplastic lesions in the duodenum, 1 of which was actively bleeding, treated with APC.  6/10 2 units PRBC.  6/11 3 units PRBC.  Antimicrobials:    Interval history/Subjective: Daughter reports cough.  Patient has no complaints.  Objective: Vitals:  Vitals:   06/22/17 1437 06/23/17 0509  BP: (!) 117/46 (!) 153/68  Pulse: 85 87  Resp: 18 18  Temp: 98 F (36.7 C) 98.2 F (36.8 C)  SpO2: 96% 95%    Exam:   Constitutional:   . Appears calm and comfortable.  Awakens easily to voice. Respiratory:  . CTA bilaterally, no w/r/r.  . Respiratory effort normal. No retractions or accessory muscle use  Cardiovascular:  . RRR, no m/r/g . No LE extremity edema   Psychiatric:  . Mental status o Mood, affect appropriate  I have personally reviewed the following:   Labs:  Hemoglobin 6.6, 6.5, 9.4, 10.0, 9.0, 8.7  Platelets 104, 111, 127   Scheduled Meds: . Chlorhexidine Gluconate Cloth  6 each Topical Q0600  . Chlorhexidine Gluconate Cloth  6 each Topical Q0600  . midodrine  10 mg Oral Q M,W,F-HD  . mupirocin ointment  1 application Nasal BID  . sevelamer carbonate  2,400 mg Oral TID WC  . simvastatin  10 mg Oral QHS  . sodium chloride flush  3 mL Intravenous Q12H   Continuous Infusions: . pantoprozole (PROTONIX) infusion 8 mg/hr (06/23/17 0700)    Principal Problem:   AVM (arteriovenous malformation) of small bowel, acquired with hemorrhage Active Problems:   Chronic systolic congestive  heart failure ([facility])   ESRD on dialysis [facility])   Vascular dementia without behavioral disturbance   Symptomatic anemia   Thrombocytopenia ([facility])   Hemorrhagic shock ([facility])   Aortic atherosclerosis ([place])   LOS: 3 days

## 2017-06-23 NOTE — Progress Notes (Signed)
  Speech Language Pathology Treatment: Dysphagia  Patient Details Name: [name] MRN: [id number] DOB: 02-27-1925 Today's Date: 06/23/2017 Time: 6811-5726 SLP Time Calculation (min) (ACUTE ONLY): 25 min  Assessment / Plan / Recommendation Clinical Impression  Pt able to wake and maintain alertness throughout diagnostic treatment for po trials with daughter present. Oral care removed mild mucous adhered to soft palate. Mild lingual holding and suspect lingual pumping with ice chips and puree. Wet vocal quality intermittently with suspected airway intrusion. Discussed options and agree with daughter daughter's decision for comfort feeds with known aspiration risks. Daughter requesting broth and therapist educated on clear, full or regular diet for family to order preferred foods and consistency. She prefers to initiate clear liquids given recent GI surgery (SLP confirmed GI's note to start diet if cleared with speech). Educated on general swallow precautions. Will follow up for upgrade.    HPI HPI: 84 year old man PMH dementia, ESRD, chronic systolic CHF, resident of assisted living, presented with confusion from outpatient dialysis, found to be hypotensive, anemic with hemoglobin of 5 down from eleven 1 week prior.  Admitted for work-up of anemia.  Had significant blood loss requiring total 5 units PRBC thus far.  EGD revealed bleeding angiodysplastic lesion of the duodenum, treated 6/11.  Daughter describes pt as talkative, active reader, socializes with fellow residents at [facility].       SLP Plan  Continue with current plan of care       Recommendations  Diet recommendations: Thin liquid(clear liquids) Liquids provided via: Cup;Straw Medication Administration: Via alternative means Supervision: Patient able to self feed;Staff to assist with self feeding;Full supervision/cueing for compensatory strategies Compensations: Slow rate;Small sips/bites Postural Changes and/or Swallow Maneuvers:  Seated upright 90 degrees                Oral Care Recommendations: Oral care BID Follow up Recommendations: None SLP Visit Diagnosis: Dysphagia, unspecified (R13.10) Plan: Continue with current plan of care       GO                Houston Siren 06/23/2017, 11:47 AM   Orbie Pyo Colvin Caroli.Ed [employer] [phone]

## 2017-06-24 LAB — CBC
HCT: 28.1 % — ABNORMAL LOW (ref 39.0–52.0)
HEMOGLOBIN: 9.3 g/dL — AB (ref 13.0–17.0)
MCH: 29.8 pg (ref 26.0–34.0)
MCHC: 33.1 g/dL (ref 30.0–36.0)
MCV: 90.1 fL (ref 78.0–100.0)
Platelets: 152 10*3/uL (ref 150–400)
RBC: 3.12 MIL/uL — AB (ref 4.22–5.81)
RDW: 15.9 % — ABNORMAL HIGH (ref 11.5–15.5)
WBC: 11.5 10*3/uL — ABNORMAL HIGH (ref 4.0–10.5)

## 2017-06-24 LAB — TYPE AND SCREEN
ABO/RH(D): B POS
ANTIBODY SCREEN: NEGATIVE
UNIT DIVISION: 0
UNIT DIVISION: 0
UNIT DIVISION: 0
UNIT DIVISION: 0
Unit division: 0
Unit division: 0
Unit division: 0

## 2017-06-24 LAB — RENAL FUNCTION PANEL
ANION GAP: 11 (ref 5–15)
Albumin: 2 g/dL — ABNORMAL LOW (ref 3.5–5.0)
BUN: 44 mg/dL — ABNORMAL HIGH (ref 6–20)
CHLORIDE: 101 mmol/L (ref 101–111)
CO2: 26 mmol/L (ref 22–32)
Calcium: 7.7 mg/dL — ABNORMAL LOW (ref 8.9–10.3)
Creatinine, Ser: 5.44 mg/dL — ABNORMAL HIGH (ref 0.61–1.24)
GFR calc non Af Amer: 8 mL/min — ABNORMAL LOW (ref >60)
GFR, EST AFRICAN AMERICAN: 10 mL/min — AB (ref >60)
Glucose, Bld: 83 mg/dL (ref 65–99)
Phosphorus: 5.1 mg/dL — ABNORMAL HIGH (ref 2.5–4.6)
Potassium: 3.2 mmol/L — ABNORMAL LOW (ref 3.5–5.1)
Sodium: 138 mmol/L (ref 135–145)

## 2017-06-24 LAB — BPAM RBC
BLOOD PRODUCT EXPIRATION DATE: 201907062359
BLOOD PRODUCT EXPIRATION DATE: 201907062359
BLOOD PRODUCT EXPIRATION DATE: 201907082359
BLOOD PRODUCT EXPIRATION DATE: 201907082359
Blood Product Expiration Date: 201906112359
Blood Product Expiration Date: 201907062359
Blood Product Expiration Date: 201907062359
ISSUE DATE / TIME: 201906101606
ISSUE DATE / TIME: 201906101858
ISSUE DATE / TIME: 201906110531
ISSUE DATE / TIME: 201906111156
ISSUE DATE / TIME: 201906111411
UNIT TYPE AND RH: 7300
UNIT TYPE AND RH: 7300
UNIT TYPE AND RH: 7300
UNIT TYPE AND RH: 7300
Unit Type and Rh: 7300
Unit Type and Rh: 7300
Unit Type and Rh: 7300

## 2017-06-24 MED ORDER — FAMOTIDINE IN NACL 20-0.9 MG/50ML-% IV SOLN
20.0000 mg | Freq: Two times a day (BID) | INTRAVENOUS | Status: DC
  Administered 2017-06-24 – 2017-06-25 (×2): 20 mg via INTRAVENOUS
  Filled 2017-06-24 (×2): qty 50

## 2017-06-24 MED ORDER — MIDODRINE HCL 5 MG PO TABS
ORAL_TABLET | ORAL | Status: AC
  Filled 2017-06-24: qty 2

## 2017-06-24 NOTE — Procedures (Signed)
I was present at this session.  I have reviewed the session itself and made appropriate changes.  Hd via. LUA AVF. bp 140s, access prss ok.    [name] 6/14/20198:27 AM

## 2017-06-24 NOTE — Plan of Care (Signed)
  Problem: Nutrition: Goal: Adequate nutrition will be maintained Note:  Feeder aspiration precaution

## 2017-06-24 NOTE — Progress Notes (Signed)
  Speech Language Pathology Treatment: Dysphagia  Patient Details Name: [name] MRN: [id number] DOB: 08-09-1925 Today's Date: 06/24/2017 Time: 7782-4235 SLP Time Calculation (min) (ACUTE ONLY): 16 min  Assessment / Plan / Recommendation Clinical Impression  Pt sleeping but awakened and maintained when name called. Appears more alert today. Family member present, daughter. Pt self fed pudding with assist to set up with oral holding with anterior leak requiring cues to swallow. Vocal quality wet and not cleared with weak volitional coughs/throat clear. He is likely penetrating/aspirating and educated other daughter present yesterday who did not want objective testing. Will upgrade diet to full liquids and follow to advance further if/when appropriate.    HPI HPI: 84 year old man PMH dementia, ESRD, chronic systolic CHF, resident of assisted living, presented with confusion from outpatient dialysis, found to be hypotensive, anemic with hemoglobin of 5 down from eleven 1 week prior.  Admitted for work-up of anemia.  Had significant blood loss requiring total 5 units PRBC thus far.  EGD revealed bleeding angiodysplastic lesion of the duodenum, treated 6/11.  Daughter describes pt as talkative, active reader, socializes with fellow residents at [facility].       SLP Plan  Continue with current plan of care       Recommendations  Diet recommendations: Thin liquid;Other(comment)(upgraded to full liquids) Liquids provided via: Cup;Straw Medication Administration: Crushed with puree Supervision: Patient able to self feed;Staff to assist with self feeding;Full supervision/cueing for compensatory strategies Compensations: Small sips/bites;Slow rate;Lingual sweep for clearance of pocketing Postural Changes and/or Swallow Maneuvers: Seated upright 90 degrees                Oral Care Recommendations: Oral care BID Follow up Recommendations: None SLP Visit Diagnosis: Dysphagia, unspecified  (R13.10) Plan: Continue with current plan of care                       Houston Siren 06/24/2017, 3:59 PM  [name] M.Ed [employer] [phone]

## 2017-06-24 NOTE — Progress Notes (Signed)
Pt is currently in dialysis Drowsy and not following commands. During shift report was able to assess and reposition.

## 2017-06-24 NOTE — Progress Notes (Signed)
PROGRESS NOTE  [name] [id number]:[id number] DOB: 1925-09-24 DOA: 06/20/2017 PCP: Lauree Chandler, NP  Brief Narrative: 84 year old man PMH dementia, ESRD, chronic systolic CHF, resident of assisted living, presented with confusion from outpatient dialysis, found to be hypotensive, anemic with hemoglobin of 5 down from eleven 1 week prior.  Admitted for work-up of anemia.  Had significant blood loss requiring total 5 units PRBC thus far.  EGD revealed bleeding angiodysplastic lesion of the duodenum, treated 6/11.  Assessment/Plan Hemorrhagic shock secondary to acute blood loss anemia/symptomatic anemia secondary to upper GI bleed from angiodysplastic lesion of the duodenum, treated with APC by EGD 6/11. --Hemodynamics, hemoglobin remained stable.  Continue PPI.  Thrombocytopenia secondary to acute blood loss. --Resolved.  No further evaluation suggested.  ESRD with modest hyperkalemia. --Continue hemodialysis per nephrology  Chronic systolic congestive heart failure, LVEF 35% by echo 2011. --Appears stable.  Dementia with acute delirium --Appears more awake today.  Tolerating diet.  Continue Haldol as needed.  Dysphagia.  Tolerating clear liquids.  Diet advance to full liquid by speech therapy.  Dementia with failure to thrive and subacute decline. --Appears more alert today..  Plan outlined by palliative medicine.  Appreciated and agree.  Aortic atherosclerosis --Resume statin on discharge     DVT prophylaxis: SCDs Code Status: DNR Family Communication: daughter [name] at bedside Disposition Plan: pending PT/OT eval    Murray Hodgkins, MD  [facility] Direct contact: [phone] --[place] [place]  --[email or web address]; password [email or web address]  7PM-7AM contact night coverage as above 06/24/2017, 4:37 PM  LOS: 4 days   Consultants:  GI  Nephrology  Procedures:  EGD  Nonbleeding esophageal ulcer.  Nonbleeding angiodysplastic stomach lesion treated with APC.  Blood in  the entire examined duodenum.  2 angiodysplastic lesions in the duodenum, 1 of which was actively bleeding, treated with APC.  6/10 2 units PRBC.  6/11 3 units PRBC.  Antimicrobials:    Interval history/Subjective: Patient denies complaints.  Per nurse patient is tolerating liquid diet.  Objective: Vitals:  Vitals:   06/24/17 1203 06/24/17 1508  BP: (!) 159/69 (!) 114/59  Pulse: 75 80  Resp: 19 18  Temp: 97.6 F (36.4 C)   SpO2: 100% 99%    Exam:  Constitutional:   . Appears calm and comfortable Respiratory:  . CTA bilaterally, no w/r/r.  . Respiratory effort normal Cardiovascular:  . RRR, no m/r/g . No LE extremity edema   Psychiatric:  . Mental status . Alert.  Speech clear.  I have personally reviewed the following:   Labs:  Hemoglobin 6.6, 6.5, 9.4, 10.0, 9.0, 8.7, 9.3  Platelets 104, 111, 127, 157  Potassium 3.2.  BMP noted.   Scheduled Meds: . Chlorhexidine Gluconate Cloth  6 each Topical Q0600  . Chlorhexidine Gluconate Cloth  6 each Topical Q0600  . midodrine  10 mg Oral Q M,W,F-HD  . mupirocin ointment  1 application Nasal BID  . sevelamer carbonate  2,400 mg Oral TID WC  . simvastatin  10 mg Oral QHS  . sodium chloride flush  3 mL Intravenous Q12H   Continuous Infusions: . sodium chloride 25 mL/hr at 06/24/17 9622    Principal Problem:   AVM (arteriovenous malformation) of small bowel, acquired with hemorrhage Active Problems:   Chronic systolic congestive heart failure (HCC)   ESRD on dialysis [facility])   Vascular dementia without behavioral disturbance   Symptomatic anemia   Thrombocytopenia (HCC)   Hemorrhagic shock (HCC)   Aortic atherosclerosis ([place])   LOS: 4 days

## 2017-06-24 NOTE — Progress Notes (Signed)
Patient is alert and orient x2, patient communicated and answered appropriately, and ate 75% of clear liquid diet. MD aware and advancing diet slowly.

## 2017-06-24 NOTE — Progress Notes (Signed)
Subjective: Interval History: no change in MS  Objective: Vital signs in last 24 hours: Temp:  [97 F (36.1 C)-98.4 F (36.9 C)] 98.3 F (36.8 C) (06/14 0644) Pulse Rate:  [75-88] 81 (06/14 0644) Resp:  [17-20] 20 (06/14 0644) BP: (116-165)/(55-75) 132/55 (06/14 0644) SpO2:  [90 %-98 %] 97 % (06/14 0644) Weight:  [68 kg (149 lb 14.6 oz)-69.4 kg (153 lb)] 68 kg (149 lb 14.6 oz) (06/13 1645) Weight change: 0 kg (0 lb)  Intake/Output from previous day: 06/13 0701 - 06/14 0700 In: 983 [P.O.:530; I.V.:453] Out: 1725 [Urine:225] Intake/Output this shift: No intake/output data recorded.  General appearance: wakes up, but incoherent,and not coop Resp: diminished breath sounds bilaterally Cardio: S1, S2 normal and systolic murmur: systolic ejection 2/6, decrescendo at 2nd left intercostal space GI: soft, non-tender; bowel sounds normal; no masses,  no organomegaly Extremities: extremities normal, atraumatic, no cyanosis or edema  AVF LUA  Lab Results: Recent Labs    06/23/17 0711 06/24/17 0808  WBC 13.1* 11.5*  HGB 8.7* 9.3*  HCT 26.5* 28.1*  PLT 127* 152   BMET:  Recent Labs    06/21/17 1140 06/23/17 0711  NA 142 143  K 5.5* 3.7  CL 106 106  CO2  --  24  GLUCOSE 95 79  BUN 138* 88*  CREATININE 9.10* 7.37*  CALCIUM  --  7.5*   No results for input(s): PTH in the last 72 hours. Iron Studies: No results for input(s): IRON, TIBC, TRANSFERRIN, FERRITIN in the last 72 hours.  Studies/Results: No results found.  I have reviewed the patient's current medications.  Assessment/Plan: 1 ESRD for HD 2 Anemia stable 3 GIB  Stable Hb, AVMs 4 Dementia 5 FTT P HD, cont to follow MS, esa     LOS: 4 days   [name] 06/24/2017,8:27 AM

## 2017-06-25 ENCOUNTER — Other Ambulatory Visit: Payer: Self-pay

## 2017-06-25 DIAGNOSIS — R1310 Dysphagia, unspecified: Secondary | ICD-10-CM

## 2017-06-25 MED ORDER — PANTOPRAZOLE SODIUM 40 MG PO TBEC
40.0000 mg | DELAYED_RELEASE_TABLET | Freq: Two times a day (BID) | ORAL | Status: DC
  Administered 2017-06-25 – 2017-06-27 (×4): 40 mg via ORAL
  Filled 2017-06-25 (×4): qty 1

## 2017-06-25 NOTE — Progress Notes (Signed)
  PROGRESS NOTE  [name] QQV:[id number] DOB: 08-Oct-1925 DOA: 06/20/2017 PCP: Lauree Chandler, NP  Brief Narrative: 84 year old man PMH dementia, ESRD, chronic systolic CHF, resident of assisted living, presented with confusion from outpatient dialysis, found to be hypotensive, anemic with hemoglobin of 5 down from eleven 1 week prior.  Admitted for work-up of anemia.  Had significant blood loss requiring total 5 units PRBC thus far.  EGD revealed bleeding angiodysplastic lesion of the duodenum, treated 6/11.  Assessment/Plan Hemorrhagic shock secondary to acute blood loss anemia/symptomatic anemia secondary to upper GI bleed from angiodysplastic lesion of the duodenum, treated with APC by EGD 6/11. --Remains stable without evidence of recurrent bleeding.  Continue PPI.  ESRD   --Continue hemodialysis per nephrology  Dementia with failure to thrive and subacute decline. --Much more awake today.  Plan outlined by palliative medicine.  Next palliative meeting 6/17.  Dysphagia.  Tolerating clear liquids.  Tolerating full liquid diet.  Thrombocytopenia secondary to acute blood loss. --Resolved.  No further evaluation suggested.  Chronic systolic congestive heart failure, LVEF 35% by echo 2011. --Stable  Aortic atherosclerosis --Resume statin on discharge   DVT prophylaxis: SCDs Code Status: DNR Family Communication:  Disposition Plan: pending PT/OT eval    Murray Hodgkins, MD  [facility] Direct contact: [phone] --Via [place]  --[email or web address]; password [email or web address]  7PM-7AM contact night coverage as above 06/25/2017, 3:50 PM  LOS: 5 days   Consultants:  GI  Nephrology  Procedures:  EGD  Nonbleeding esophageal ulcer.  Nonbleeding angiodysplastic stomach lesion treated with APC.  Blood in the entire examined duodenum.  2 angiodysplastic lesions in the duodenum, 1 of which was actively bleeding, treated with APC.  6/10 2 units PRBC.  6/11 3 units  PRBC.  Antimicrobials:    Interval history/Subjective: Feels okay today.  Objective: Vitals:  Vitals:   06/25/17 0834 06/25/17 1110  BP: (!) 136/55 (!) 137/57  Pulse: 78 81  Resp:  16  Temp: 99.3 F (37.4 C) 98 F (36.7 C)  SpO2: 98% 98%    Exam:  Constitutional:   . Appears calm and comfortable.  Very awake. Respiratory:  . CTA bilaterally, no w/r/r.  . Respiratory effort normal.  Cardiovascular:  . RRR, no m/r/g . No LE extremity edema   Psychiatric:  . Mental status o Mood, affect appropriate . Follows simple commands.    I have personally reviewed the following:   Labs:  No labs today   Scheduled Meds: . Chlorhexidine Gluconate Cloth  6 each Topical Q0600  . Chlorhexidine Gluconate Cloth  6 each Topical Q0600  . midodrine  10 mg Oral Q M,W,F-HD  . mupirocin ointment  1 application Nasal BID  . pantoprazole  40 mg Oral BID AC  . sevelamer carbonate  2,400 mg Oral TID WC  . simvastatin  10 mg Oral QHS  . sodium chloride flush  3 mL Intravenous Q12H   Continuous Infusions: . sodium chloride 25 mL/hr at 06/24/17 6606    Principal Problem:   AVM (arteriovenous malformation) of small bowel, acquired with hemorrhage Active Problems:   Chronic systolic congestive heart failure (HCC)   ESRD on dialysis [facility])   Vascular dementia without behavioral disturbance   Symptomatic anemia   Thrombocytopenia (HCC)   Hemorrhagic shock (HCC)   Aortic atherosclerosis ([place])   LOS: 5 days
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
## 2017-06-26 MED ORDER — FLUCONAZOLE 100MG IVPB: 100.0000 mg | INTRAVENOUS | Status: DC

## 2017-06-26 MED ORDER — FLUCONAZOLE 100MG IVPB
100.0000 mg | INTRAVENOUS | Status: DC
  Administered 2017-06-26: 100 mg via INTRAVENOUS
  Filled 2017-06-26: qty 50

## 2017-06-26 MED ORDER — CHLORHEXIDINE GLUCONATE CLOTH 2 % EX PADS
6.0000 | MEDICATED_PAD | Freq: Every day | CUTANEOUS | Status: DC
  Administered 2017-06-26: 6 via TOPICAL

## 2017-06-26 MED ORDER — POTASSIUM CHLORIDE CRYS ER 20 MEQ PO TBCR
20.0000 meq | EXTENDED_RELEASE_TABLET | Freq: Once | ORAL | Status: AC
  Administered 2017-06-26: 20 meq via ORAL
  Filled 2017-06-26: qty 1

## 2017-06-26 NOTE — Progress Notes (Signed)
MD, pt may need some magic mouthwash to help with Thrush on his tongue, will continue to monitor, Thanks Arvella Nigh RN.

## 2017-06-26 NOTE — Progress Notes (Signed)
Subjective: Interval History: has no complaint .  Objective: Vital signs in last 24 hours: Temp:  [98 F (36.7 C)-99.3 F (37.4 C)] 98.7 F (37.1 C) (06/16 0354) Pulse Rate:  [78-88] 78 (06/16 0354) Resp:  [16-20] 20 (06/16 0354) BP: (130-155)/(55-60) 155/60 (06/16 0354) SpO2:  [98 %] 98 % (06/16 0354) Weight:  [65.9 kg (145 lb 4.5 oz)] 65.9 kg (145 lb 4.5 oz) (06/16 0354) Weight change: 0.8 kg (1 lb 12.2 oz)  Intake/Output from previous day: 06/15 0701 - 06/16 0700 In: 583 [P.O.:580; I.V.:3] Out: 200 [Urine:200] Intake/Output this shift: Total I/O In: 3 [I.V.:3] Out: -   General appearance: pale, slowed mentation and cachectic, Ox1,  not coop due to MS Resp: diminished breath sounds bilaterally Cardio: S1, S2 normal and systolic murmur: systolic ejection 2/6, crescendo at 2nd left intercostal space GI: soft, non-tender; bowel sounds normal; no masses,  no organomegaly Extremities: eczematoid changes in LE, AVF LUA.   Lab Results: Recent Labs    06/24/17 0808  WBC 11.5*  HGB 9.3*  HCT 28.1*  PLT 152   BMET:  Recent Labs    06/24/17 0808  NA 138  K 3.2*  CL 101  CO2 26  GLUCOSE 83  BUN 44*  CREATININE 5.44*  CALCIUM 7.7*   No results for input(s): PTH in the last 72 hours. Iron Studies: No results for input(s): IRON, TIBC, TRANSFERRIN, FERRITIN in the last 72 hours.  Studies/Results: No results found.  I have reviewed the patient's current medications.  Assessment/Plan: 1 ESRD for HD 2 GIB Hb stable.  3 Anemia stable esa 4 Dementia. Needs to be more interactive prior to d/c 5 Severe FTT 6 HPTH P Hd, try in chair ,NOT CANDIDATE FOR OUTPATIENT DIALYSIS AT THIS TIME   LOS: 6 days   Jeneen Rinks Oluwatobi Visser 06/26/2017,8:18 AM

## 2017-06-26 NOTE — Progress Notes (Addendum)
PT Cancellation Note  Patient Details Name: [name] MRN: [id number] DOB: 01-25-1925   Cancelled Treatment:     Attempted to work with patient at [place], patient sleeping heavily despite numerous attempts to keep awake for therapy. Will cont to follow.  Reinaldo Berber, PT, DPT Acute Rehab Services Pager: [phone]      Reinaldo Berber 06/26/2017, 9:12 AM

## 2017-06-26 NOTE — Progress Notes (Signed)
Pharmacy Antibiotic Note  [name] is a 84 y.o. male admitted on 06/20/2017 with upper GIB. Pharmacy has been consulted for fluconazole dosing for oropharyngeal candidiasis.  Plan: Fluconazole 100mg  IV q24h , give in evenings so that dose can be given after HD when pt dialyzed.    Height: 6\' 1"  (185.4 cm) Weight: 145 lb 4.5 oz (65.9 kg) IBW/kg (Calculated) : 79.9  Temp (24hrs), Avg:98.5 F (36.9 C), Min:98 F (36.7 C), Max:98.9 F (37.2 C)  Recent Labs  Lab 06/20/17 1356 06/20/17 1357 06/20/17 1650 06/21/17 0308 06/21/17 1140 06/21/17 2014 06/22/17 0557 06/22/17 1542 06/23/17 0711 06/24/17 0808  WBC  --   --   --  10.8*  --  15.1* 13.2* 15.9* 13.1* 11.5*  CREATININE 8.90*  --   --  9.19* 9.10*  --   --   --  7.37* 5.44*  LATICACIDVEN  --  5.02* 6.49*  --   --   --   --   --   --   --     Estimated Creatinine Clearance: 8.6 mL/min (A) (by C-G formula based on SCr of 5.44 mg/dL (H)).    No Known Allergies  Thank you for allowing pharmacy to be a part of this patient's care.  Nicole Cella, RPh Clinical Pharmacist [phone]. 06/26/2017 4:35 PM

## 2017-06-26 NOTE — Progress Notes (Signed)
  PROGRESS NOTE  [name] [id number]:[id number] DOB: 1925/11/07 DOA: 06/20/2017 PCP: Lauree Chandler, NP  Brief Narrative: 84 year old man PMH dementia, ESRD, chronic systolic CHF, resident of assisted living, presented with confusion from outpatient dialysis, found to be hypotensive, anemic with hemoglobin of 5 down from eleven 1 week prior.  Admitted for work-up of anemia.  Had significant blood loss requiring total 5 units PRBC thus far.  EGD revealed bleeding angiodysplastic lesion of the duodenum, treated 6/11.  Assessment/Plan Hemorrhagic shock secondary to acute blood loss anemia/symptomatic anemia secondary to upper GI bleed from angiodysplastic lesion of the duodenum, treated with APC by EGD 6/11. --stable, no bleeding noted. Continue PPI.   ESRD   --Continue HD per nephrology  Dementia with failure to thrive and subacute decline. --alertness waxes and wanes.  Plan outlined by palliative medicine.  Next palliative meeting 6/17 for further [place].  Dysphagia.  Tolerating full liquids.  Thrombocytopenia secondary to acute blood loss. --Resolved.  No further evaluation suggested.  Chronic systolic congestive heart failure, LVEF 35% by echo 2011. --appears stable  Aortic atherosclerosis --Resume statin on discharge   DVT prophylaxis: SCDs Code Status: DNR Family Communication:  Disposition Plan: uncertain   Murray Hodgkins, MD  [facility] Direct contact: [phone] --Via [place]  --[email or web address]; password [email or web address]  7PM-7AM contact night coverage as above 06/26/2017, 1:39 PM  LOS: 6 days   Consultants:  GI  Nephrology  Procedures:  EGD  Nonbleeding esophageal ulcer.  Nonbleeding angiodysplastic stomach lesion treated with APC.  Blood in the entire examined duodenum.  2 angiodysplastic lesions in the duodenum, 1 of which was actively bleeding, treated with APC.  6/10 2 units PRBC.  6/11 3 units PRBC.  Antimicrobials:    Interval  history/Subjective: Sleeping today  Objective: Vitals:  Vitals:   06/26/17 0354 06/26/17 1214  BP: (!) 155/60 (!) 150/56  Pulse: 78 84  Resp: 20 18  Temp: 98.7 F (37.1 C) 98.9 F (37.2 C)  SpO2: 98% 99%    Exam:  Constitutional:   . Appears calm and comfortable Respiratory:  . CTA bilaterally, no w/r/r.  . Respiratory effort normal Cardiovascular:  . RRR, no m/r/g . No LE extremity edema    I have personally reviewed the following:   Labs:  No labs today   Scheduled Meds: . Chlorhexidine Gluconate Cloth  6 each Topical Q0600  . Chlorhexidine Gluconate Cloth  6 each Topical Q0600  . midodrine  10 mg Oral Q M,W,F-HD  . pantoprazole  40 mg Oral BID AC  . sevelamer carbonate  2,400 mg Oral TID WC  . simvastatin  10 mg Oral QHS  . sodium chloride flush  3 mL Intravenous Q12H   Continuous Infusions: . sodium chloride Stopped (06/26/17 0141)    Principal Problem:   AVM (arteriovenous malformation) of small bowel, acquired with hemorrhage Active Problems:   Chronic systolic CHF (congestive heart failure) (HCC)   ESRD on dialysis [facility])   Vascular dementia without behavioral disturbance   Symptomatic anemia   Thrombocytopenia (HCC)   Hemorrhagic shock ([place])   Aortic atherosclerosis ([place])   Dysphagia   LOS: 6 days

## 2017-06-26 NOTE — Plan of Care (Signed)
  Problem: Nutrition: Goal: Adequate nutrition will be maintained Outcome: Progressing   Problem: Pain Managment: Goal: General experience of comfort will improve Outcome: Progressing   Problem: Skin Integrity: Goal: Risk for impaired skin integrity will decrease Outcome: Progressing   

## 2017-06-27 DIAGNOSIS — R578 Other shock: Secondary | ICD-10-CM

## 2017-06-27 MED ORDER — ACETAMINOPHEN 325 MG PO TABS: 650.0000 mg | ORAL_TABLET | Freq: Four times a day (QID) | ORAL | Status: DC | PRN

## 2017-06-27 MED ORDER — HALOPERIDOL LACTATE 2 MG/ML PO CONC
0.5000 mg | ORAL | Status: DC | PRN
  Filled 2017-06-27: qty 0.3

## 2017-06-27 MED ORDER — HALOPERIDOL 0.5 MG PO TABS
0.5000 mg | ORAL_TABLET | ORAL | Status: DC | PRN
  Filled 2017-06-27: qty 1

## 2017-06-27 MED ORDER — GLYCOPYRROLATE 0.2 MG/ML IJ SOLN
0.2000 mg | INTRAMUSCULAR | Status: DC | PRN
  Filled 2017-06-27: qty 1

## 2017-06-27 MED ORDER — GLYCOPYRROLATE 1 MG PO TABS
1.0000 mg | ORAL_TABLET | ORAL | Status: DC | PRN
  Filled 2017-06-27: qty 1

## 2017-06-27 MED ORDER — NYSTATIN 100000 UNIT/ML MT SUSP: 5.0000 mL | Freq: Four times a day (QID) | OROMUCOSAL | 0 refills | Status: AC

## 2017-06-27 MED ORDER — BIOTENE DRY MOUTH MT LIQD: 15.0000 mL | OROMUCOSAL | Status: DC | PRN

## 2017-06-27 MED ORDER — ACETAMINOPHEN 650 MG RE SUPP: 650.0000 mg | Freq: Four times a day (QID) | RECTAL | Status: DC | PRN

## 2017-06-27 MED ORDER — POLYVINYL ALCOHOL 1.4 % OP SOLN
1.0000 [drp] | Freq: Four times a day (QID) | OPHTHALMIC | Status: DC | PRN
  Filled 2017-06-27: qty 15

## 2017-06-27 MED ORDER — MORPHINE SULFATE (CONCENTRATE) 10 MG/0.5ML PO SOLN: 5.0000 mg | ORAL | Status: DC | PRN

## 2017-06-27 MED ORDER — ONDANSETRON 4 MG PO TBDP: 4.0000 mg | ORAL_TABLET | Freq: Four times a day (QID) | ORAL | Status: DC | PRN

## 2017-06-27 MED ORDER — NYSTATIN 100000 UNIT/ML MT SUSP
5.0000 mL | Freq: Four times a day (QID) | OROMUCOSAL | Status: DC
  Administered 2017-06-27 (×3): 500000 [IU] via ORAL
  Filled 2017-06-27 (×2): qty 5

## 2017-06-27 MED ORDER — ONDANSETRON HCL 4 MG/2ML IJ SOLN: 4.0000 mg | Freq: Four times a day (QID) | INTRAMUSCULAR | Status: DC | PRN

## 2017-06-27 MED ORDER — LORAZEPAM 1 MG PO TABS: 1.0000 mg | ORAL_TABLET | ORAL | Status: DC | PRN

## 2017-06-27 MED ORDER — LORAZEPAM 2 MG/ML IJ SOLN: 1.0000 mg | INTRAMUSCULAR | Status: DC | PRN

## 2017-06-27 MED ORDER — HALOPERIDOL LACTATE 5 MG/ML IJ SOLN: 0.5000 mg | INTRAMUSCULAR | Status: DC | PRN

## 2017-06-27 MED ORDER — PANTOPRAZOLE SODIUM 40 MG PO TBEC: 40.0000 mg | DELAYED_RELEASE_TABLET | Freq: Two times a day (BID) | ORAL | Status: AC

## 2017-06-27 MED ORDER — LORAZEPAM 2 MG/ML PO CONC: 1.0000 mg | ORAL | Status: DC | PRN

## 2017-06-27 NOTE — Clinical Social Work Note (Signed)
Clinical Social Worker facilitated patient discharge including contacting patient family and facility to confirm patient discharge plans.  Clinical information faxed to facility and family agreeable with plan.  CSW arranged ambulance transport via [facility] to [facility].  RN to call [phone] for report prior to discharge.  Clinical Social Worker will sign off for now as social work intervention is no longer needed. Please consult us again if new need arises.  Mahamad Jabbi LCSWA [phone]   

## 2017-06-27 NOTE — Progress Notes (Signed)
[facility]) - Registration Visit   Received request from [place] for family interest in [facility] with request for transfer today.  Chart reviewed and met with patient and family to confirm interest and explain services. Family agreeable to transfer today. CSW aware.  Registration paper work completed with daughter [name].  Daughter [name] at patient bedside as well.  Dr. Orpah Melter to assume care per family request.  Please fax discharge summary to [phone].  RN please call report to [phone]. Please arrange transport for patient to arrive as soon as possible.    Thank you,  Gar Ponto, [facility] Liaison  [place] now found on AMION

## 2017-06-27 NOTE — Progress Notes (Signed)
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  Treated 6/11    He has been failing to thrive as an outpatient secondary to dementia and there have been discussions with the family regarding continuing dialysis  No complaints this morning   Objective:  Vital signs in last 24 hours:  Temp:  [97.9 F (36.6 C)-98.9 F (37.2 C)] 97.9 F (36.6 C) (06/17 0442) Pulse Rate:  [72-84] 77 (06/17 0442) Resp:  [17-18] 18 (06/17 0442) BP: (150-166)/(56-64) 166/64 (06/17 0442) SpO2:  [97 %-99 %] 98 % (06/17 0442) Weight:  [140 lb 6.9 oz (63.7 kg)] 140 lb 6.9 oz (63.7 kg) (06/17 0442)  Weight change: -4 lb 13.6 oz (-2.2 kg) Filed Weights   06/25/17 0429 06/26/17 0354 06/27/17 0442  Weight: 141 lb 12.1 oz (64.3 kg) 145 lb 4.5 oz (65.9 kg) 140 lb 6.9 oz (63.7 kg)    Intake/Output: I/O last 3 completed shifts: In: 303 [P.O.:300; I.V.:3] Out: 350 [Urine:350]   Intake/Output this shift:  No intake/output data recorded.  CVS- RRR  2/6  Murmur  RS- CTA   Diminished  ABD- BS present soft non-distended EXT- no edema  LUA thrill and bruit    Basic Metabolic Panel: Recent Labs  Lab 06/20/17 1332 06/20/17 1356 06/21/17 0308 06/21/17 1140 06/23/17 0711 06/24/17 0808  NA 143 140 144 142 143 138  K 5.8* 5.6* 5.7* 5.5* 3.7 3.2*  CL 102 101 105 106 106 101  CO2 22  --  22  --  24 26  GLUCOSE 112* 111* 105* 95 79 83  BUN 133* 124* 152* 138* 88* 44*  CREATININE 8.63* 8.90* 9.19* 9.10* 7.37* 5.44*  CALCIUM 8.9  --  8.3*  --  7.5* 7.7*  PHOS  --   --   --   --   --  5.1*    Liver Function Tests: Recent Labs  Lab 06/20/17 1332 06/24/17 0808  AST 19  --   ALT 9*  --   ALKPHOS 63  --   BILITOT 0.5  --   PROT 5.7*  --   ALBUMIN 2.7* 2.0*   No results for input(s): LIPASE, AMYLASE in the last 168  hours. Recent Labs  Lab 06/20/17 1438  AMMONIA 20    CBC: Recent Labs  Lab 06/20/17 1332  06/21/17 2014 06/22/17 0002 06/22/17 0557 06/22/17 1542 06/23/17 0711 06/24/17 0808  WBC 10.3   < > 15.1*  --  13.2* 15.9* 13.1* 11.5*  NEUTROABS 9.0*  --   --   --   --   --   --   --   HGB 5.1*   < > 9.4* 10.0* 9.0* 9.3* 8.7* 9.3*  HCT 16.8*   < > 27.5* 29.2* 26.6* 26.2* 26.5* 28.1*  MCV 95.5   < > 85.1  --  86.4 85.1 88.9 90.1  PLT 136*   < > 104*  --  111* 115* 127* 152   < > = values in this interval not displayed.    Cardiac Enzymes: Recent Labs  Lab 06/20/17 1332  TROPONINI 0.05*    BNP: Invalid input(s): POCBNP  CBG: No results for input(s): GLUCAP in the last 168 hours.  Microbiology: Results for orders placed or performed during the hospital encounter of 06/20/17  MRSA PCR Screening  Status: Abnormal   Collection Time: 06/20/17 10:11 PM  Result Value Ref Range Status   MRSA by PCR POSITIVE (A) NEGATIVE Final    Comment:        The GeneXpert MRSA Assay (FDA approved for NASAL specimens only), is one component of a comprehensive MRSA colonization surveillance program. It is not intended to diagnose MRSA infection nor to guide or monitor treatment for MRSA infections. RESULT CALLED TO, READ BACK BY AND VERIFIED WITH: A BUENDIA RN 06/21/17 0426 JDW Performed at [facility] [address] [place], [place] [address]     Coagulation Studies: No results for input(s): LABPROT, INR in the last 72 hours.  Urinalysis: No results for input(s): COLORURINE, LABSPEC, PHURINE, GLUCOSEU, HGBUR, BILIRUBINUR, KETONESUR, PROTEINUR, UROBILINOGEN, NITRITE, LEUKOCYTESUR in the last 72 hours.  Invalid input(s): APPERANCEUR    Imaging: No results found.   Medications:    . nystatin  5 mL Oral QID  . pantoprazole  40 mg Oral BID AC  . sodium chloride flush  3 mL Intravenous Q12H   acetaminophen **OR** acetaminophen, antiseptic oral rinse, glycopyrrolate  **OR** glycopyrrolate **OR** glycopyrrolate, haloperidol **OR** haloperidol **OR** haloperidol lactate, LORazepam **OR** LORazepam **OR** LORazepam, morphine CONCENTRATE **OR** morphine CONCENTRATE, ondansetron **OR** ondansetron (ZOFRAN) IV, polyvinyl alcohol  Assessment/ Plan:   1. End stage renal disease  Continue hemodialysis MWF  He is continuing to decline and the long term use of dialysis is questionable at this time. Discussions are continuing with the family. I agree with Dr Jimmy Footman that outpatient dialysis may not be an option in this man 2. Anemia secondary to blood loss and angiodysplasia of duodenum  6/11 - continues on protonix  3. Dementia  Stable ongoing discussions on goals of care   LOS: 7 Shallon Yaklin W @TODAY @11 :23 AM

## 2017-06-27 NOTE — Progress Notes (Signed)
Physical Therapy Discharge Patient Details Name: [name] MRN: [id number] DOB: 25-Nov-1925 Today's Date: 06/27/2017 Time:  -     Patient discharged from PT services secondary to medical decline - will need to re-order PT to resume therapy services.  Please see latest therapy progress note for current level of functioning and progress toward goals.    Progress and discharge plan discussed with patient and/or caregiver: Patient/Caregiver agrees with plan  Rn advised pt has been placed on comfort care. Spoke with family present and they are requesting PT serviced be discontinued. Discontinue orders received and acknowledged in chart.     Allena Katz 06/27/2017, 1:11 PM

## 2017-06-27 NOTE — Progress Notes (Addendum)
Called facility to give report on patient to Arbie Cookey (nurse at [facility]).  Sent discharge instructions with PTAR.

## 2017-06-27 NOTE — Clinical Social Work Note (Signed)
[facility] can accept patient today. Judeen Hammans, RN will meet with daughters to complete paperwork. CSW paged MD to notify.  Dayton Scrape, [place]

## 2017-06-27 NOTE — Progress Notes (Addendum)
Daily Progress Note   Patient Name: [name]       Date: 06/27/2017 DOB: 1925-05-13  Age: 84 y.o. MRN#: [id number] Attending Physician: Samuella Cota, MD Primary Care Physician: Lauree Chandler, NP Admit Date: 06/20/2017  Reason for Consultation/Follow-up: Establishing goals of care  Subjective: Patient is resting in bed. He awakens to say hello, and denies pain. He goes back to sleep. His daughters [name] and [name] are at bedside. Hilda Blades was on speaker phone. We discussed concerns of dysphagia, oral intake, his mental status, and his mittens. We discussed his quality of life and best and worst case scenerios. His family feels he is ready to focus on comfort care, and be comfortable for what time he has left. They are amenable to stopping dialysis and focusing on comfort. Family very engaged in finding out each other's feelings throughout discussion. Per Hilda Blades, though she is the first in line POA, she would like her sisters to complete any neccessary paperwork as she has returned to [place]. Her other 2 sisters are 2nd Architectural technologist) and 3rd [name]) [place].   MOST form completed for DNR, comfort measures, no abx, no IV fluids, no feeding tube.   Length of Stay: 7  Current Medications: Scheduled Meds:  . nystatin  5 mL Oral QID  . pantoprazole  40 mg Oral BID AC  . sodium chloride flush  3 mL Intravenous Q12H    Continuous Infusions:   PRN Meds: acetaminophen **OR** acetaminophen, antiseptic oral rinse, glycopyrrolate **OR** glycopyrrolate **OR** glycopyrrolate, haloperidol **OR** haloperidol **OR** haloperidol lactate, LORazepam **OR** LORazepam **OR** LORazepam, morphine CONCENTRATE **OR** morphine CONCENTRATE, ondansetron **OR** ondansetron (ZOFRAN) IV, polyvinyl alcohol  Physical  Exam  Constitutional: No distress.  Pulmonary/Chest: Effort normal.  Neurological:  Awakens to say hi and deny complaint. Quickly closes his eyes.             Vital Signs: BP (!) 166/64 (BP Location: Right Arm)   Pulse 77   Temp 97.9 F (36.6 C) (Oral)   Resp 18   Ht 6\' 1"  (1.854 m)   Wt 63.7 kg (140 lb 6.9 oz)   SpO2 98%   BMI 18.53 kg/m  SpO2: SpO2: 98 % O2 Device: O2 Device: Room Air O2 Flow Rate:    Intake/output summary:   Intake/Output Summary (Last 24 hours) at 06/27/2017  [place] filed at 06/27/2017 0700 Gross per 24 hour  Intake 200 ml  Output 250 ml  Net -50 ml   LBM: Last BM Date: 06/26/17 Baseline Weight: Weight: 66 kg (145 lb 6.4 oz) Most recent weight: Weight: 63.7 kg (140 lb 6.9 oz)       Palliative Assessment/Data: 30%      Patient Active Problem List   Diagnosis Date Noted  . Dysphagia 06/25/2017  . Thrombocytopenia ([place]) 06/22/2017  . Hemorrhagic shock ([place]) 06/22/2017  . Aortic atherosclerosis ([place]) 06/22/2017  . AVM (arteriovenous malformation) of small bowel, acquired with hemorrhage   . Symptomatic anemia 06/20/2017  . Vascular dementia without behavioral disturbance 11/22/2016  . Chronic obstructive pulmonary disease ([place]) 11/18/2016  . ESRD on dialysis ([place]) 06/05/2013  . Chronic systolic CHF (congestive heart failure) ([place]) 04/15/2011  . Essential hypertension 04/12/2011  . Hyperlipidemia 04/12/2011  . Tobacco abuse 04/12/2011    Palliative Care Assessment & Plan   Patient Profile: SampsonBuieis a89 y.o.male,HXof dementia, ESRD on MWF schedule. Over the past few weeks he has been acting differently, and more confused. He went to dialysis  where he was found to be hypotensive with a hemoglobin of 5 which is down from eleven 1 week ago, he was sent to the ER where hemoglobin of 5 was confirmed. EGD was performed with AVM cauterized. Continuing dialysis. Patient mental status is not  improving.   Assessment/Recommendations/Plan:  Plans for transfer to hospice facility. Stopping dialysis. Dysphagia. Plans to focus on comfort.     Code Status:    Code Status Orders  (From admission, onward)        Start     Ordered   06/27/17 1036  Do not attempt resuscitation (DNR)  Continuous    Question Answer Comment  In the event of cardiac or respiratory ARREST Do not call a "code blue"   In the event of cardiac or respiratory ARREST Do not perform Intubation, CPR, defibrillation or ACLS   In the event of cardiac or respiratory ARREST Use medication by any route, position, wound care, and other measures to relive pain and suffering. May use oxygen, suction and manual treatment of airway obstruction as needed for comfort.   Comments MOST form completed.      06/27/17 1036    Code Status History    Date Active Date Inactive Code Status Order ID Comments User Context   06/20/2017 1710 06/27/2017 1036 DNR [id number]  Thurnell Lose, MD ED   04/13/2011 0000 04/19/2011 2235 DNR [id number]  Imelda Pillow, RN Inpatient    Advance Directive Documentation     Most Recent Value  Type of Advance Directive  Healthcare Power of Attorney  Pre-existing out of facility DNR order (yellow form or pink MOST form)  -  "MOST" Form in Place?  -       Prognosis:  < 2 weeks  D/Cing dialysis. Poor oral intake. Mittens in place discontinued and mediations added for comfort.   Discharge Planning:  Hospice facility  Care plan was discussed with RN and primary team.   Thank you for allowing the Palliative Medicine Team to assist in the care of this patient.   Time In: 9:20 Time Out: 10:40 Total Time 70 min Prolonged Time Billed  no      Greater than 50%  of this time was spent counseling and coordinating care related to the above assessment and plan.  Asencion Gowda, NP  Please contact Palliative Medicine Team phone  at [phone] for questions and concerns.

## 2017-06-27 NOTE — Clinical Social Work Note (Signed)
Clinical Social Work Assessment  Patient Details  Name: [name] MRN: [id number] Date of Birth: December 13, 1925  Date of referral:  06/27/17               Reason for consult:  Facility Placement, End of Life/Hospice, Discharge Planning                Permission sought to share information with:  Facility Sport and exercise psychologist, Family Supports Permission granted to share information::  Yes, Verbal Permission Granted  Name::     Theodosia Paling and [place]::  [employer]  Relationship::  Daughters  Contact Information:  Velma: [phone], Janice: [phone]  Housing/Transportation Living arrangements for the past 2 months:  [place] of Information:  Medical Team, Adult Children Patient Interpreter Needed:  None Criminal Activity/Legal Involvement Pertinent to Current Situation/Hospitalization:  No - Comment as needed Significant Relationships:  Adult Children Lives with:  Facility Resident Do you feel safe going back to the place where you live?  Yes Need for family participation in patient care:  Yes (Comment)  Care giving concerns:  Residential hospice facility.   Social Worker assessment / plan:  Patient not fully oriented or awake. Daughters at bedside. CSW introduced role and explained that discharge planning would be discussed. Patient's daughters confirmed plan for hospice facility and that [facility] is first preference. Patient's daughter, [name], is best contact. CSW made referral to [facility]. She will review referral and stated they do have beds today. No further concerns. CSW encouraged patient's daughters to contact CSW as needed. CSW will continue to follow patient and his daughters for support and facilitate discharge to [facility] once bed available.   Employment status:  Retired Nurse, adult PT Recommendations:  [place] / Referral to community resources:  Other (Comment  Required)(Residential Hospice)  Patient/Family's Response to care:  Patient not fully oriented/awake. Patient's daughters agreeable to [facility]. Patient's daughters supportive and involved in patient's care. Patient's daughters appreciated social work intervention.  Patient/Family's Understanding of and Emotional Response to Diagnosis, Current Treatment, and Prognosis:  Patient not fully oriented/awake. Patient's daughters have a good understanding of the reason for admission and prognosis. Patient's daughters appear happy with hospital care.  Emotional Assessment Appearance:  Appears stated age Attitude/Demeanor/Rapport:  Unable to Assess Affect (typically observed):  Unable to Assess Orientation:  Oriented to Self Alcohol / Substance use:  Tobacco Use Psych involvement (Current and /or in the community):  No (Comment)  Discharge Needs  Concerns to be addressed:  Care Coordination Readmission within the last 30 days:  No Current discharge risk:  Cognitively Impaired, Dependent with Mobility, Terminally ill Barriers to Discharge:  Other(Waiting on [facility] to review referral)   Candie Chroman, LCSW 06/27/2017, 12:27 PM

## 2017-06-27 NOTE — Discharge Summary (Signed)
Physician Discharge Summary  [name] QBH:[id number] DOB: 06/28/1925 DOA: 06/20/2017  PCP: Lauree Chandler, NP  Admit date: 06/20/2017 Discharge date: 06/27/2017   Comfort care, transfer to residential hospice  Medications per hospice in addition to below  Discharge Diagnoses:  1. Hemorrhagic shock secondary to acute blood loss anemia/symptomatic anemia secondary to upper GI bleed from angiodysplastic lesion of the duodenum 2. ESRD   3. Dementia with failure to thrive and subacute decline. 4. Dysphagia 5. Oral candidiasis 6. Thrombocytopenia secondary to acute blood loss. 7. Chronic systolic congestive heart failure 8. Aortic atherosclerosis  Discharge Condition: stable but long-term prognosis poor Disposition: residential hospice  Diet recommendation: comfort feeds as desired  Filed Weights   06/25/17 0429 06/26/17 0354 06/27/17 0442  Weight: 64.3 kg (141 lb 12.1 oz) 65.9 kg (145 lb 4.5 oz) 63.7 kg (140 lb 6.9 oz)    History of present illness:  84 year old man PMH dementia, ESRD, chronic systolic CHF, resident of assisted living, presented with confusion from outpatient dialysis, found to be hypotensive, anemic with hemoglobin of 5 down from eleven 1 week prior. Admitted for work-up of anemia.    Hospital Course:  Had significant blood loss requiring total 5 units PRBC.  EGD revealed bleeding angiodysplastic lesion of the duodenum, treated 6/11.  No further bleeding was noted and hemoglobin remained stable.  However, patient has a history of dementia and failure to thrive and had been undergoing a subacute decline per family.  Mentally he failed to significantly improve, oral intake was variable and palliative medicine was involved to discuss further goals of care.  Plan was made to trial several days further of hemodialysis and if mental status did not improve, consider transition to comfort care.  Although the patient has been medically stable, he has failed to improve in  regard to his mental status and oral intake in a consistent fashion.  Given his decline overtime prior to admission, in discussion with palliative medicine the family has elected to pursue comfort care and hospice.  Appears to be quite reasonable in light of his obvious stability and chronic decline.  Hemorrhagic shock secondary to acute blood loss anemia/symptomatic anemia secondary to upper GI bleed from angiodysplastic lesion of the duodenum, treated with APC by EGD 6/11. --stable, no bleeding noted.   ESRD   --Previously on hemodialysis, now comfort care  Dementia with failure to thrive and subacute decline. --Plan comfort care  Dysphagia.  Comfort feeds as desired.  Oral candidiasis. --Nystatin  Thrombocytopenia secondary to acute blood loss. --Resolved.  No further evaluation suggested.  Chronic systolic congestive heart failure, LVEF 35% by echo 2011. --appears stable  Aortic atherosclerosis --No treatment indicated  Consultants:  GI  Nephrology  Procedures:  EGD  Nonbleeding esophageal ulcer.  Nonbleeding angiodysplastic stomach lesion treated with APC.  Blood in the entire examined duodenum.  2 angiodysplastic lesions in the duodenum, 1 of which was actively bleeding, treated with APC.  6/10 2 units PRBC.  6/11 3 units PRBC.  Today's assessment: S: Resting comfortably. O: Vitals:  Vitals:   06/27/17 0442 06/27/17 1218  BP: (!) 166/64 (!) 144/64  Pulse: 77 74  Resp: 18   Temp: 97.9 F (36.6 C) 98.1 F (36.7 C)  SpO2: 98% 98%    Constitutional:  . Appears calm and comfortable Respiratory:  . CTA bilaterally, no w/r/r.  . Respiratory effort normal.  Cardiovascular:  . RRR, no m/r/g  Discharge Instructions  Discharge Instructions    Diet - low sodium heart  healthy   Complete by:  As directed    Increase activity slowly   Complete by:  As directed      Allergies as of 06/27/2017   No Known Allergies     Medication List    STOP  taking these medications   aspirin 81 MG tablet   CERTA-VITE SENIOR-LUTEIN PO   cinacalcet 60 MG tablet Commonly known as:  SENSIPAR   metoprolol tartrate 25 MG tablet Commonly known as:  LOPRESSOR   midodrine 10 MG tablet Commonly known as:  PROAMATINE   RENVELA 800 MG tablet Generic drug:  sevelamer carbonate   simvastatin 10 MG tablet Commonly known as:  ZOCOR     TAKE these medications   nystatin 100000 UNIT/ML suspension Commonly known as:  MYCOSTATIN Take 5 mLs (500,000 Units total) by mouth 4 (four) times daily.   pantoprazole 40 MG tablet Commonly known as:  PROTONIX Take 1 tablet (40 mg total) by mouth 2 (two) times daily before a meal.      No Known Allergies  The results of significant diagnostics from this hospitalization (including imaging, microbiology, ancillary and laboratory) are listed below for reference.    Significant Diagnostic Studies: Ct Head Wo Contrast  Result Date: 06/20/2017 CLINICAL DATA:  Hypotension and altered mental status from dialysis today. EXAM: CT HEAD WITHOUT CONTRAST TECHNIQUE: Contiguous axial images were obtained from the base of the skull through the vertex without intravenous contrast. COMPARISON:  04/12/2011 FINDINGS: BRAIN: There is sulcal and ventricular prominence consistent with superficial and central atrophy. No intraparenchymal hemorrhage, mass effect nor midline shift. Periventricular and subcortical white matter hypodensities consistent with chronic small vessel ischemic disease are identified. No acute large vascular territory infarcts. No abnormal extra-axial fluid collections. Basal cisterns are not effaced and midline. VASCULAR: Moderate calcific atherosclerosis of the carotid siphons. SKULL: No skull fracture. No significant scalp soft tissue swelling. SINUSES/ORBITS: The mastoid air-cells are clear. Polypoid mucous retention cyst along the floor the right maxillary sinus with mild to moderate circumferential mucosal  thickening of the left maxillary sinus. Mild ethmoid sinus mucosal thickening is also noted. The frontal and sphenoid sinuses are unremarkable. The included ocular globes and orbital contents are non-suspicious. OTHER: None. IMPRESSION: Atrophy with chronic small vessel ischemic disease. No acute intracranial findings. Electronically Signed   By: Ashley Royalty M.D.   On: 06/20/2017 18:04   Dg Chest Port 1 View  Result Date: 06/20/2017 CLINICAL DATA:  Shortness of breath, hypotension, dialysis patient EXAM: PORTABLE CHEST 1 VIEW COMPARISON:  02/28/2014, 02/18/2011 FINDINGS: Mild cardiomegaly without CHF, edema or effusions. No definite focal pneumonia, collapse or consolidation. Trachea is midline. Negative for pneumothorax. Aorta is atherosclerotic. Stable paratracheal soft tissue density, suspect related to tortuous vasculature. IMPRESSION: Cardiomegaly without acute chest process.  Stable exam. Atherosclerosis Electronically Signed   By: Jerilynn Mages.  Shick M.D.   On: 06/20/2017 13:56    Microbiology: Recent Results (from the past 240 hour(s))  MRSA PCR Screening     Status: Abnormal   Collection Time: 06/20/17 10:11 PM  Result Value Ref Range Status   MRSA by PCR POSITIVE (A) NEGATIVE Final    Comment:        The GeneXpert MRSA Assay (FDA approved for NASAL specimens only), is one component of a comprehensive MRSA colonization surveillance program. It is not intended to diagnose MRSA infection nor to guide or monitor treatment for MRSA infections. RESULT CALLED TO, READ BACK BY AND VERIFIED WITH: A BUENDIA RN 06/21/17 0426 JDW Performed  at [facility], [place] [address] [place],  [address]      Labs: Basic Metabolic Panel: Recent Labs  Lab 06/21/17 0308 06/21/17 1140 06/23/17 0711 06/24/17 0808  NA 144 142 143 138  K 5.7* 5.5* 3.7 3.2*  CL 105 106 106 101  CO2 22  --  24 26  GLUCOSE 105* 95 79 83  BUN 152* 138* 88* 44*  CREATININE 9.19* 9.10* 7.37* 5.44*  CALCIUM 8.3*   --  7.5* 7.7*  PHOS  --   --   --  5.1*   Liver Function Tests: Recent Labs  Lab 06/24/17 0808  ALBUMIN 2.0*    Recent Labs  Lab 06/20/17 1438  AMMONIA 20   CBC: Recent Labs  Lab 06/21/17 2014 06/22/17 0002 06/22/17 0557 06/22/17 1542 06/23/17 0711 06/24/17 0808  WBC 15.1*  --  13.2* 15.9* 13.1* 11.5*  HGB 9.4* 10.0* 9.0* 9.3* 8.7* 9.3*  HCT 27.5* 29.2* 26.6* 26.2* 26.5* 28.1*  MCV 85.1  --  86.4 85.1 88.9 90.1  PLT 104*  --  111* 115* 127* 152    Principal Problem:   AVM (arteriovenous malformation) of small bowel, acquired with hemorrhage Active Problems:   Chronic systolic CHF (congestive heart failure) (HCC)   ESRD on dialysis [facility])   Vascular dementia without behavioral disturbance   Symptomatic anemia   Thrombocytopenia ([facility])   Hemorrhagic shock ([facility])   Aortic atherosclerosis ([facility])   Dysphagia   Time coordinating discharge: 35 minutes  Signed:  Murray Hodgkins, MD [facility] 06/27/2017, 2:37 PM

## 2017-06-30 ENCOUNTER — Telehealth: Payer: Self-pay

## 2017-06-30 NOTE — Telephone Encounter (Signed)
Palliative Medicine RN Note: Rec'd a call from [place] requesting to speak with our NP [name], who is unfortunately unavailable right now.   Mr [name] was discharged Monday 6/17 to [facility]. [name] had concerns about patient's status changing at [facility] and continued recommendation for hospice. I explained that our providers only see patients while they are actively admitted to the hospital. I directed her toward the physicians at [facility] and provided her with the contact information for them.  Marjie Skiff Laxmi Choung, RN, BSN, [facility] Palliative Medicine Team 06/30/2017 8:59 AM Office [phone]

## 2017-07-11 DEATH — deceased

## 2017-11-03 ENCOUNTER — Ambulatory Visit: Payer: Medicare Other | Admitting: Nurse Practitioner

## 2018-04-04 ENCOUNTER — Ambulatory Visit: Payer: Self-pay

## 2019-11-18 IMAGING — DX DG CHEST 1V PORT
1 series · 1 of 1 positions shown · non-contrast
Comparison: 02/28/2014, 02/18/2011

CLINICAL DATA: Shortness of breath, hypotension, dialysis patient

EXAM:
PORTABLE CHEST 1 VIEW

[chest ap]
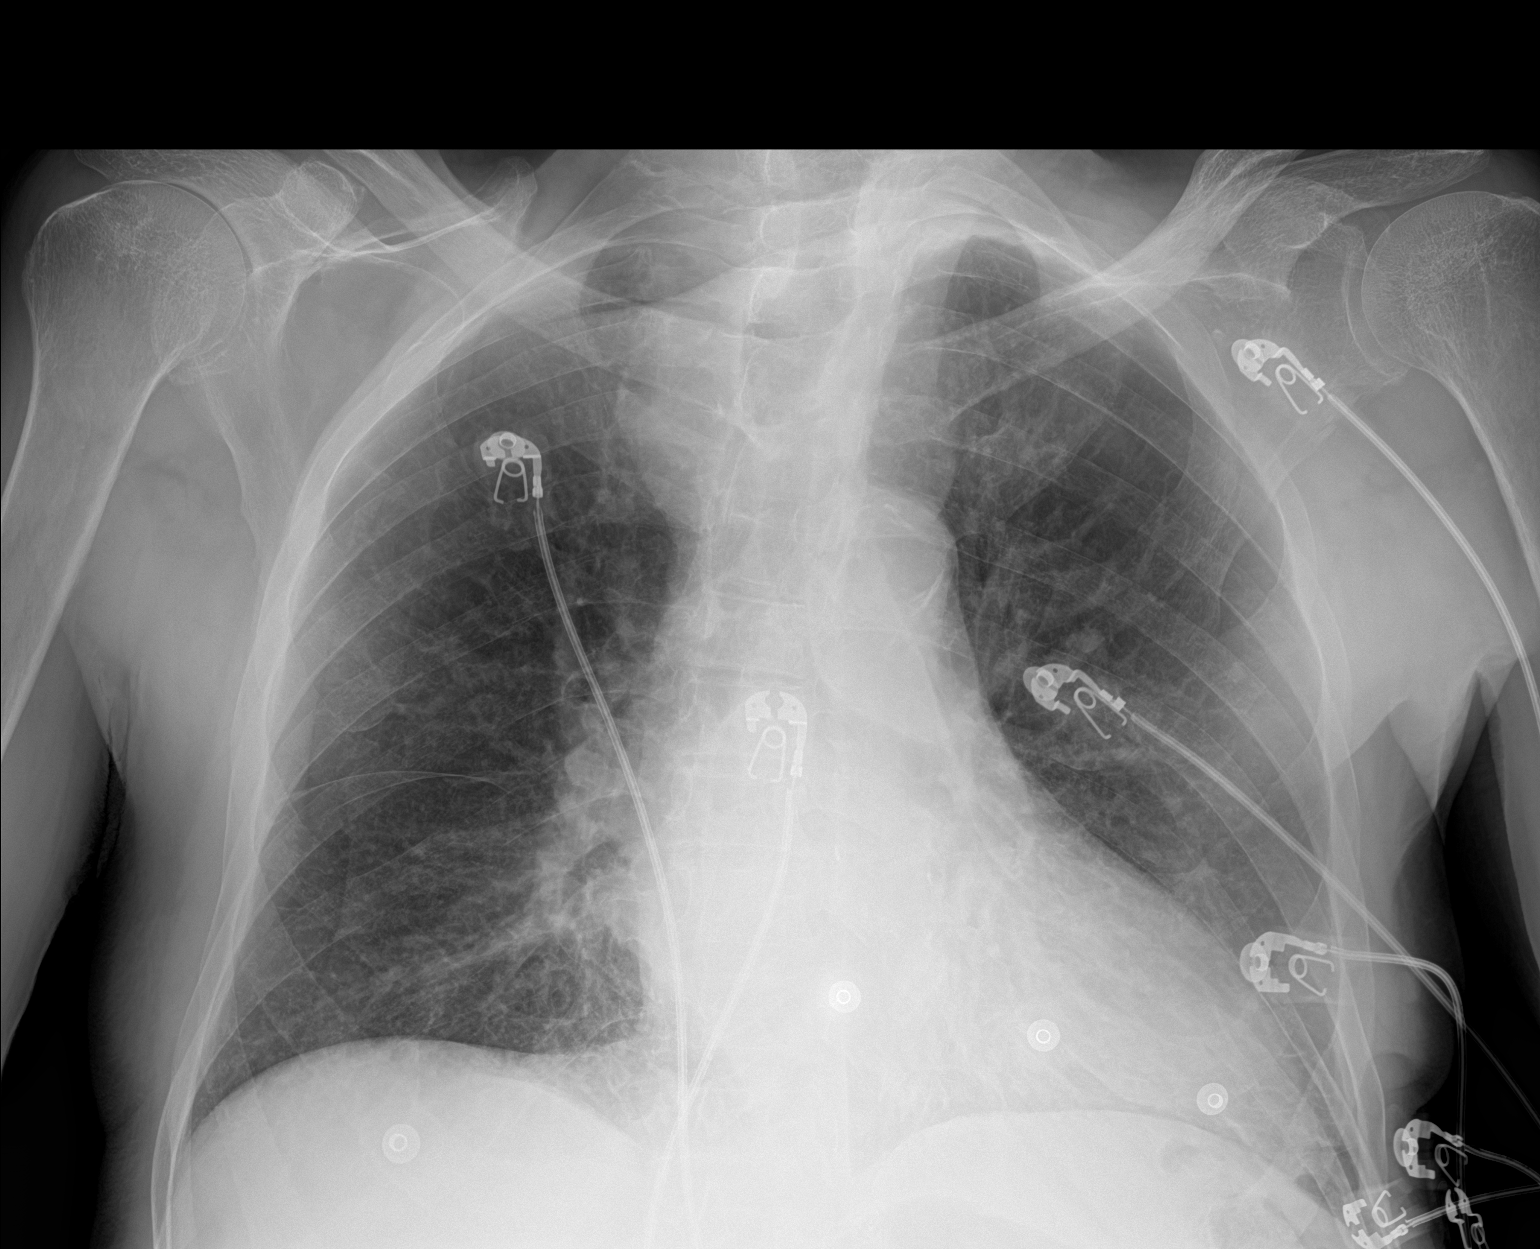

[1 of 1 positions shown; findings below may reference images not displayed]

FINDINGS: Mild cardiomegaly without CHF, edema or effusions. No definite focal
pneumonia, collapse or consolidation. Trachea is midline. Negative
for pneumothorax. Aorta is atherosclerotic.

Stable paratracheal soft tissue density, suspect related to tortuous
vasculature.
IMPRESSION: Cardiomegaly without acute chest process.  Stable exam.

Atherosclerosis
